# Patient Record
Sex: Female | Born: 1990 | Hispanic: Yes
Health system: Southern US, Community
[De-identification: ages and names within clinical notes are randomized; demographics above are authoritative.]

## PROBLEM LIST (undated history)

## (undated) ENCOUNTER — Inpatient Hospital Stay (HOSPITAL_COMMUNITY): Payer: Medicaid Other

## (undated) ENCOUNTER — Inpatient Hospital Stay (HOSPITAL_COMMUNITY): Payer: Self-pay

## (undated) ENCOUNTER — Emergency Department (HOSPITAL_COMMUNITY): Admission: EM | Payer: BC Managed Care – PPO

## (undated) DIAGNOSIS — Z789 Other specified health status: Secondary | ICD-10-CM

## (undated) DIAGNOSIS — O24419 Gestational diabetes mellitus in pregnancy, unspecified control: Secondary | ICD-10-CM

## (undated) DIAGNOSIS — E119 Type 2 diabetes mellitus without complications: Secondary | ICD-10-CM

## (undated) DIAGNOSIS — Z8759 Personal history of other complications of pregnancy, childbirth and the puerperium: Secondary | ICD-10-CM

## (undated) DIAGNOSIS — T7840XA Allergy, unspecified, initial encounter: Secondary | ICD-10-CM

## (undated) HISTORY — PX: INTRAUTERINE DEVICE (IUD) INSERTION: SHX5877

## (undated) HISTORY — DX: Allergy, unspecified, initial encounter: T78.40XA

## (undated) HISTORY — DX: Other specified health status: Z78.9

## (undated) HISTORY — PX: NO PAST SURGERIES: SHX2092

---
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
## 2016-02-10 ENCOUNTER — Ambulatory Visit (INDEPENDENT_AMBULATORY_CARE_PROVIDER_SITE_OTHER): Payer: Self-pay | Admitting: Urgent Care

## 2016-02-10 VITALS — BP 125/79 | HR 83 | Temp 98.0°F | Resp 18 | Ht 61.0 in | Wt 202.0 lb

## 2016-02-10 DIAGNOSIS — J029 Acute pharyngitis, unspecified: Secondary | ICD-10-CM

## 2016-02-10 DIAGNOSIS — R05 Cough: Secondary | ICD-10-CM

## 2016-02-10 DIAGNOSIS — R059 Cough, unspecified: Secondary | ICD-10-CM

## 2016-02-10 DIAGNOSIS — R519 Headache, unspecified: Secondary | ICD-10-CM

## 2016-02-10 DIAGNOSIS — R0989 Other specified symptoms and signs involving the circulatory and respiratory systems: Secondary | ICD-10-CM

## 2016-02-10 DIAGNOSIS — R52 Pain, unspecified: Secondary | ICD-10-CM

## 2016-02-10 DIAGNOSIS — R51 Headache: Secondary | ICD-10-CM

## 2016-02-10 MED ORDER — HYDROCODONE-HOMATROPINE 5-1.5 MG/5ML PO SYRP: 5.0000 mL | ORAL_SOLUTION | Freq: Every evening | ORAL | 0 refills | Status: DC | PRN

## 2016-02-10 MED ORDER — PSEUDOEPHEDRINE HCL ER 120 MG PO TB12: 120.0000 mg | ORAL_TABLET | Freq: Two times a day (BID) | ORAL | 3 refills | Status: DC

## 2016-02-10 MED ORDER — CETIRIZINE HCL 10 MG PO TABS: 10.0000 mg | ORAL_TABLET | Freq: Every day | ORAL | 11 refills | Status: DC

## 2016-02-10 MED ORDER — BENZONATATE 100 MG PO CAPS: 100.0000 mg | ORAL_CAPSULE | Freq: Three times a day (TID) | ORAL | 0 refills | Status: DC | PRN

## 2016-02-10 NOTE — Patient Instructions (Addendum)
Gripe en los adultos (Influenza, Adult) La gripe es una infeccin viral que afecta principalmente las vas respiratorias. Las vas respiratorias incluyen rganos que ayudan a Industrial/product designerrespirar, Toll Brotherscomo los pulmones, la nariz y Administratorla garganta. La gripe provoca muchos sntomas del resfro comn, as como fiebre alta y Tourist information centre managerdolor corporal. Se transmite fcilmente de persona a persona (es contagiosa). La mejor manera de prevenir la gripe es aplicndose la vacuna contra la gripe todos los aos. CAUSAS La gripe es causada por un virus. Se puede contagiar el virus de las siguientes [place] Plainsmaneras:  Al aspirar las gotitas que una persona infectada elimina al toser o Engineering geologistestornudar.  Al tocar algo que fue recientemente contaminado con el virus y Tenet Healthcareluego llevarse la mano a la boca, la nariz o los ojos. FACTORES DE RIESGO Los siguientes factores pueden hacer que usted sea propenso a Primary school teachercontraer la gripe:  No lavarse las manos frecuentemente con agua y Belarusjabn o un desinfectante de manos a base de alcohol.  Tener contacto cercano con [place] personas durante la temporada de resfro y gripe.  Tocarse la boca, los ojos o la nariz sin lavarse ni desinfectarse las manos primero.  No beber suficientes lquidos o no seguir una dieta saludable.  No dormir lo suficiente o no hacer suficiente actividad fsica.  Tener un alto grado de estrs.  No colocarse la vacuna anual contra la gripe. Puede correr un mayor riesgo de complicaciones de la gripe, como una infeccin pulmonar grave (neumona) si:  Tiene ms de 65aos.  Est embarazada.  Tiene un sistema que combate las defensas (sistema inmunitario) debilitado. Puede tener un sistema inmunitario debilitado si:  Tiene VIH o sida.  Est recibiendo quimioterapia.  Bots[place]asa medicamentos que reducen Coventry Health Carela actividad (suprimen) del sistema inmunitario.  Tiene una enfermedad a largo plazo (crnica), como cardiopata coronaria, enfermedad renal, diabetes o enfermedad pulmonar.  Tiene un trastorno  heptico.  Son obesas.  Tiene anemia. SNTOMAS Los sntomas de esta afeccin por lo general duran entre 4 y 2010das, y Conservator, museum/gallerypueden tener los siguientes sntomas:  Grant RutsFiebre.  Escalofros.  Dolor de Turkmenistancabeza, dolores en el cuerpo o dolores musculares.  Dolor de Advertising copywritergarganta.  Tos.  Secrecin o congestin nasal.  Molestias en el pecho y tos.  Prdida del apetito.  Debilidad o cansancio (fatiga).  Mareos.  Nuseas o vmitos. DIAGNSTICO Esta afeccin se puede diagnosticar en funcin de los antecedentes mdicos y un examen fsico. El mdico puede indicarle un cultivo farngeo o nasal para confirmar el diagnstico. TRATAMIENTO Si la gripe se detecta de manera temprana, puede recibir tratamiento con medicamentos antivirales que pueden reducir la duracin de la enfermedad y la gravedad de los sntomas. Este medicamento se puede administrar por va oral o por va intravenosa (IV), es decir, a travs de un tubo que se coloca en una vena. El objetivo del tratamiento es aliviar los sntomas cuidndose en su casa. Esto puede incluir usar medicamentos de venta libre, beber una gran cantidad de lquidos y agregar humedad al aire en su casa. En algunos casos, la gripe se cura sin tratamiento. Los casos graves o las complicaciones de gripe se pueden tratar en un hospital. INSTRUCCIONES PARA EL CUIDADO EN EL World Fuel Services CorporationHOGAR  Tome los medicamentos de venta libre y los recetados solamente como se lo haya indicado el mdico.  Use un humidificador de aire fro para agregar humedad al aire de su casa. Esto puede ayudarlo a Solicitorrespirar mejor.  Descanse todo lo que sea necesario.  Beba suficiente lquido para Photographermantener la orina clara o de color  amarillo plido.  Al toser o estornudar, cbrase la boca y la Mitchellnariz.  Lvese las manos con agua y jabn frecuentemente, en especial despus de toser o Engineering geologistestornudar. Use desinfectante para manos si no dispone de [place]agua y Bel[place]jabn.  Lanny HurstQudese en su casa y no concurra al Aleen Campitrabajo o a la  escuela, como se lo haya indicado el mdico. A menos que visite al American Expressmdico, evite salir de su casa hasta que no tenga fiebre durante 24horas sin el uso de medicamentos.  Concurra a todas las visitas de control como se lo haya indicado el mdico. Esto es importante. PREVENCIN  Aplicarse la vacuna anual contra la gripe es la mejor manera de evitar contagiarse la gripe. Puede aplicarse la vacuna contra la gripe a fines de verano, en otoo o en invierno. Pregntele al mdico cundo debe aplicarse la vacuna contra la gripe.  Lvese las manos o use un desinfectante de manos con frecuencia.  Evite el contacto con personas que estn enfermas durante la temporada de resfro y gripe.  Siga una dieta saludable, beba muchos lquidos, duerma lo suficiente y realice actividad fsica con regularidad. SOLICITE ATENCIN MDICA SI:  Presenta nuevos sntomas.  Tiene los siguientes sntomas:  Journalist, newspaperDolor en el pecho.  Diarrea.  Grant RutsFiebre.  La tos empeora.  Produce ms mucosidad.  Siente nuseas o vomita. SOLICITE ATENCIN MDICA DE INMEDIATO SI:  Le falta el aire o tiene dificultad para respirar.  La piel o las uas se tornan de un color Enochvilleazulado.  Presenta dolor intenso o rigidez de cuello.  Le duele la cabeza de forma repentina o tiene dolor en la cara o el odo.  No puede dejar de vomitar. Esta informacin no tiene Theme park managercomo fin reemplazar el consejo del mdico. Asegrese de hacerle al mdico cualquier pregunta que tenga. Document Released: 11/10/2004 Document Revised: 05/25/2015 Document Reviewed: 11/25/2014 Elsevier Interactive Patient Education  2017 [employer]vier Inc.     IF you received an x-ray today, you will receive an invoice from [facility]. Please contact [facility] at [phone] with questions or concerns regarding your invoice.   IF you received labwork today, you will receive an invoice from [facility]. Please contact [facility] at [phone] with questions or concerns  regarding your invoice.   Our billing staff will not be able to assist you with questions regarding bills from these companies.  You will be contacted with the lab results as soon as they are available. The fastest way to get your results is to activate your My Chart account. Instructions are located on the last page of this paperwork. If you have not heard from us regarding the results in 2 weeks, please contact this office.

## 2016-02-10 NOTE — Progress Notes (Signed)
    MRN: [id number] DOB: [birth year]  Subjective:   Kristy BuntingFatima Nguyen is a 25 y.o. female presenting for chief complaint of Fever (poss flu); Sore Throat; and Headache  Reports 5 day history of fever, headache, nausea with vomiting 2 days ago, nasal congestion, productive cough that initially was causing chest pain but is now improving, body aches. Has tried otc medications with minimal relief. Denies shob, wheezing, abdominal pain, rashes. Patient has not been resting, does not hydrate well. Smokes 1-2 cigarettes per day.   Kristy Nguyen is not currently taking any medications. Also is allergic to penicillins.  Kristy Nguyen  has a past medical history of Allergy. Also denies past surgical history.   Her family history includes Diabetes in her maternal grandmother.   Objective:   Vitals: BP 125/79 (BP Location: Right Arm, Patient Position: Sitting, Cuff Size: Large)   Pulse 83   Temp 98 F (36.7 C) (Oral)   Resp 18   Ht 5\' 1"  (1.549 m)   Wt 202 lb (91.6 kg)   SpO2 98%   BMI 38.17 kg/m   Physical Exam  Constitutional: She is oriented to person, place, and time. She appears well-developed and well-nourished.  HENT:  TM's intact bilaterally, no effusions or erythema. Nasal turbinates pink and moist, nasal passages minimally patent. No sinus tenderness. Tonsils enlarged but no exudates, erythema, mucous membranes moist, dentition in good repair.  Eyes: Right eye exhibits no discharge. Left eye exhibits no discharge.  Neck: Normal range of motion. Neck supple.  Cardiovascular: Normal rate, regular rhythm and intact distal pulses.  Exam reveals no gallop and no friction rub.   No murmur heard. Pulmonary/Chest: No respiratory distress. She has no wheezes. She has no rales.  Lymphadenopathy:    She has no cervical adenopathy.  Neurological: She is alert and oriented to person, place, and time.  Skin: Skin is warm and dry.   Assessment and Plan :   1. Body aches 2. Sore throat 3. Nonintractable  headache, unspecified chronicity pattern, unspecified headache type 4. Cough 5. Chest congestion - I suspect patient has influenza, will manage supportively for now. She refused testing including strep swab. I recommended she come back if she is not better by Saturday. She agreed to do strep swab and other work up at that point. Work note provided.  Wallis BambergMario Ariza Evans, PA-C [facility] [phone] 02/10/2016 12:25 PM

## 2016-02-15 NOTE — L&D Delivery Note (Signed)
Patient is 26 y.o. G2P0010 6060w2d admitted for IOL 2/2 oligohydraminos, hx of A2GDM  Delivery Note  Upon arrival patient was complete and pushing. She pushed with good maternal effort to deliver a viable infant in cephalic, LOA position. x1 nuchal cord, easily reduced.  Baby delivered with difficulty. Mother had tight pelvic outlet. Head delivered without difficulty but anterior shoulder did not.Shoulder dystocia was called and lasted 45sec. Manuvers used included McRoberts and Suprapubic pressure to deliver anterior shoulder. Baby did not have good tone at time of delivery and was nto breathing. Code APGAR called and cord clamped immediately and infant handed off to NICU team.    At 3:47 PM a viable female was delivered via  (Presentation: LOA).  APGAR: 6, 8; weight  3215g.  Placenta status: Intact.  Cord: 3V with the following complications: None.  Cord pH: 7.25  Anesthesia: Epidural Episiotomy:  None Lacerations: 1st degree Suture Repair: 3.0 monocryl Est. Blood Loss (mL):  100mL  Mom to postpartum.  Baby to Couplet care / Skin to Skin.  Vaginal canal and perineum was inspected and hemostatic. Pitocin was started and uterus massaged until bleeding slowed.  Fundus firm on exam with massage.  Counts of sharps, instruments, and lap pads were all correct.  Caryl AdaJazma Phelps, DO OB Fellow Faculty Practice, [facility] 09/15/2016, 4:13 PM

## 2016-03-26 ENCOUNTER — Inpatient Hospital Stay (HOSPITAL_COMMUNITY)
Admission: AD | Admit: 2016-03-26 | Discharge: 2016-03-26 | Disposition: A | Discharge status: Home / Self Care | Payer: Medicaid Other | Source: Ambulatory Visit | Attending: Obstetrics & Gynecology | Admitting: Obstetrics & Gynecology

## 2016-03-26 ENCOUNTER — Encounter (HOSPITAL_COMMUNITY): Payer: Self-pay | Admitting: *Deleted

## 2016-03-26 ENCOUNTER — Inpatient Hospital Stay (HOSPITAL_COMMUNITY): Payer: Medicaid Other

## 2016-03-26 DIAGNOSIS — O26851 Spotting complicating pregnancy, first trimester: Secondary | ICD-10-CM | POA: Diagnosis not present

## 2016-03-26 DIAGNOSIS — O99331 Smoking (tobacco) complicating pregnancy, first trimester: Secondary | ICD-10-CM | POA: Diagnosis not present

## 2016-03-26 DIAGNOSIS — R1030 Lower abdominal pain, unspecified: Secondary | ICD-10-CM | POA: Diagnosis not present

## 2016-03-26 DIAGNOSIS — Z3A12 12 weeks gestation of pregnancy: Secondary | ICD-10-CM | POA: Insufficient documentation

## 2016-03-26 DIAGNOSIS — O23591 Infection of other part of genital tract in pregnancy, first trimester: Secondary | ICD-10-CM | POA: Diagnosis not present

## 2016-03-26 DIAGNOSIS — Z3A10 10 weeks gestation of pregnancy: Secondary | ICD-10-CM

## 2016-03-26 DIAGNOSIS — O210 Mild hyperemesis gravidarum: Secondary | ICD-10-CM | POA: Diagnosis not present

## 2016-03-26 DIAGNOSIS — O26891 Other specified pregnancy related conditions, first trimester: Secondary | ICD-10-CM | POA: Insufficient documentation

## 2016-03-26 DIAGNOSIS — B9689 Other specified bacterial agents as the cause of diseases classified elsewhere: Secondary | ICD-10-CM

## 2016-03-26 DIAGNOSIS — F17200 Nicotine dependence, unspecified, uncomplicated: Secondary | ICD-10-CM | POA: Diagnosis not present

## 2016-03-26 DIAGNOSIS — R109 Unspecified abdominal pain: Secondary | ICD-10-CM | POA: Diagnosis present

## 2016-03-26 DIAGNOSIS — O209 Hemorrhage in early pregnancy, unspecified: Secondary | ICD-10-CM | POA: Diagnosis present

## 2016-03-26 DIAGNOSIS — N760 Acute vaginitis: Secondary | ICD-10-CM

## 2016-03-26 DIAGNOSIS — N939 Abnormal uterine and vaginal bleeding, unspecified: Secondary | ICD-10-CM

## 2016-03-26 LAB — CBC WITH DIFFERENTIAL/PLATELET
Basophils Absolute: 0 10*3/uL (ref 0.0–0.1)
Basophils Relative: 0 %
EOS PCT: 1 %
Eosinophils Absolute: 0.1 10*3/uL (ref 0.0–0.7)
HCT: 36.3 % (ref 36.0–46.0)
HEMOGLOBIN: 12.4 g/dL (ref 12.0–15.0)
LYMPHS ABS: 2 10*3/uL (ref 0.7–4.0)
LYMPHS PCT: 25 %
MCH: 29.5 pg (ref 26.0–34.0)
MCHC: 34.2 g/dL (ref 30.0–36.0)
MCV: 86.4 fL (ref 78.0–100.0)
Monocytes Absolute: 0.4 10*3/uL (ref 0.1–1.0)
Monocytes Relative: 5 %
Neutro Abs: 5.8 10*3/uL (ref 1.7–7.7)
Neutrophils Relative %: 69 %
PLATELETS: 300 10*3/uL (ref 150–400)
RBC: 4.2 MIL/uL (ref 3.87–5.11)
RDW: 12.5 % (ref 11.5–15.5)
WBC: 8.3 10*3/uL (ref 4.0–10.5)

## 2016-03-26 LAB — URINALYSIS, ROUTINE W REFLEX MICROSCOPIC
BILIRUBIN URINE: NEGATIVE
Bacteria, UA: NONE SEEN
GLUCOSE, UA: NEGATIVE mg/dL
Hgb urine dipstick: NEGATIVE
Ketones, ur: 20 mg/dL — AB
LEUKOCYTES UA: NEGATIVE
NITRITE: NEGATIVE
PH: 6 (ref 5.0–8.0)
Protein, ur: 30 mg/dL — AB
Specific Gravity, Urine: 1.019 (ref 1.005–1.030)
WBC UA: NONE SEEN WBC/hpf (ref 0–5)

## 2016-03-26 LAB — WET PREP, GENITAL
SPERM: NONE SEEN
TRICH WET PREP: NONE SEEN
WBC WET PREP: NONE SEEN
Yeast Wet Prep HPF POC: NONE SEEN

## 2016-03-26 LAB — HCG, QUANTITATIVE, PREGNANCY: hCG, Beta Chain, Quant, S: 66864 m[IU]/mL — ABNORMAL HIGH (ref <5)

## 2016-03-26 LAB — ABO/RH: ABO/RH(D): O POS

## 2016-03-26 LAB — POCT PREGNANCY, URINE: Preg Test, Ur: POSITIVE — AB

## 2016-03-26 MED ORDER — METOCLOPRAMIDE HCL 5 MG PO TABS: 5.0000 mg | ORAL_TABLET | Freq: Four times a day (QID) | ORAL | 1 refills | Status: DC

## 2016-03-26 MED ORDER — METRONIDAZOLE 500 MG PO TABS: 500.0000 mg | ORAL_TABLET | Freq: Three times a day (TID) | ORAL | 0 refills | Status: AC

## 2016-03-26 NOTE — MAU Note (Signed)
Pt presents to MAU with complaints of lower abdominal cramping for a month, with vaginal spotting on and off. Pt states she was told that she was ten weeks pregnant at a clinic last week by blood work. Pt also complains of nausea

## 2016-03-26 NOTE — Discharge Instructions (Signed)
Vaginosis bacteriana (Bacterial Vaginosis) La vaginosis bacteriana es una infeccin de la vagina. Se produce cuando crece una cantidad excesiva de grmenes normales (bacterias sanas) en la vagina. Esta infeccin aumenta el riesgo de contraer otras infecciones de transmisin sexual. El tratamiento de esta infeccin puede ayudar a reducir el riesgo de otras infecciones, como:  Clamidia.  Gonorrea.  VIH.  Herpes. CUIDADOS EN EL HOGAR  Tome los medicamentos tal como se lo indic su mdico.  Finalice la prescripcin completa, aunque comience a sentirse mejor.  Comunique a sus compaeros sexuales que sufre una infeccin. Deben consultar a su mdico para iniciar un tratamiento.  Durante el tratamiento: ? Evite mantener relaciones sexuales o use preservativos de la forma correcta. ? No se haga duchas vaginales. ? No consuma alcohol a menos que el mdico lo autorice. ? No amamante a menos que el mdico la autorice.  SOLICITE AYUDA SI:  No mejora luego de 3 das de tratamiento.  Observa una secrecin (prdida) de color gris ms abundante que proviene de la vagina.  Siente ms dolor que antes.  Tiene fiebre.  ASEGRESE DE QUE:  Comprende estas instrucciones.  Controlar su afeccin.  Recibir ayuda de inmediato si no mejora o si empeora.  Esta informacin no tiene como fin reemplazar el consejo del mdico. Asegrese de hacerle al mdico cualquier pregunta que tenga. Document Released: 04/29/2008 Document Revised: 05/25/2015 Document Reviewed: 09/12/2012 Elsevier Interactive Patient Education  2017 Elsevier Inc.  

## 2016-03-26 NOTE — MAU Provider Note (Signed)
History   Patient Kristy Nguyen is a 26 year old G2P0010 at 10 weeks by uncertain LMP. She presents with spotting today and on Thursday, as well as abdominal pain that has been going on for a month. She has felt nauseated for the past month and has been unable to keep food down.   CSN: [id number]  Arrival date and time: 03/26/16 1055   None     Chief Complaint  Patient presents with  . Vaginal Bleeding  . Morning Sickness   Abdominal Pain  This is a new problem. The current episode started 1 to 4 weeks ago. The onset quality is gradual. The problem occurs 2 to 4 times per day. The problem has been unchanged. The pain is located in the suprapubic region. The pain is at a severity of 6/10. The quality of the pain is cramping. The abdominal pain does not radiate. Pertinent negatives include no anorexia, arthralgias, belching, constipation, diarrhea, dysuria, fever, flatus, frequency, headaches, hematochezia, hematuria, melena, myalgias, nausea, vomiting or weight loss. Nothing aggravates the pain. The pain is relieved by nothing. She has tried nothing for the symptoms.  Vaginal Bleeding  The patient's primary symptoms include vaginal bleeding. The patient's pertinent negatives include no genital itching, genital lesions, genital odor, genital rash, missed menses, pelvic pain or vaginal discharge. This is a new problem. The current episode started in the past 7 days. Episode frequency: she had some drops of blood on her underwear on Thursday and some drops of blood this morning. Last intercourse was a week ago.  The patient is experiencing no pain. Associated symptoms include abdominal pain. Pertinent negatives include no anorexia, constipation, diarrhea, dysuria, fever, frequency, headaches, hematuria, nausea or vomiting.    OB History    Gravida Para Term Preterm AB Living   2       1     SAB TAB Ectopic Multiple Live Births   1              Past Medical History:  Diagnosis Date  .  Allergy     History reviewed. No pertinent surgical history.  Family History  Problem Relation Age of Onset  . Diabetes Maternal Grandmother     Social History  Substance Use Topics  . Smoking status: Current Some Day Smoker  . Smokeless tobacco: Former Neurosurgeon  . Alcohol use No    Allergies:  Allergies  Allergen Reactions  . Penicillins Rash and Other (See Comments)    Has patient had a PCN reaction causing immediate rash, facial/tongue/throat swelling, SOB or lightheadedness with hypotension: No Has patient had a PCN reaction causing severe rash involving mucus membranes or skin necrosis: No Has patient had a PCN reaction that required hospitalization No Has patient had a PCN reaction occurring within the last 10 years: Yes If all of the above answers are "NO", then may proceed with Cephalosporin use.    Prescriptions Prior to Admission  Medication Sig Dispense Refill Last Dose  . Prenatal Vit-Fe Fumarate-FA (PRENATAL MULTIVITAMIN) TABS tablet Take 1 tablet by mouth daily.   Past Week at Unknown time    Review of Systems  Constitutional: Negative for fever and weight loss.  Eyes: Negative.   Respiratory: Negative.   Cardiovascular: Negative.   Gastrointestinal: Positive for abdominal pain. Negative for anorexia, constipation, diarrhea, flatus, hematochezia, melena, nausea and vomiting.  Endocrine: Negative.   Genitourinary: Positive for vaginal bleeding. Negative for dysuria, frequency, hematuria, missed menses, pelvic pain and vaginal discharge.  Musculoskeletal: Negative  for arthralgias and myalgias.  Allergic/Immunologic: Negative.   Neurological: Negative for headaches.   Physical Exam   There were no vitals taken for this visit.  Physical Exam  Constitutional: She is oriented to person, place, and time. She appears well-developed.  HENT:  Head: Normocephalic.  Eyes: Pupils are equal, round, and reactive to light.  Neck: Normal range of motion.  Respiratory:  Effort normal. No respiratory distress. She has no wheezes. She has no rales. She exhibits no tenderness.  GI: Soft. She exhibits no distension and no mass. There is no tenderness. There is no rebound and no guarding.  Genitourinary:  Genitourinary Comments: NEFG; no lesions on internal vaginal walls. Cervix is pink with no lesions; no CMT. No discharge in the vagina. No vaginal bleeding on exam. No suprapubic tenderness on palpation.   Musculoskeletal: Normal range of motion.  Neurological: She is alert and oriented to person, place, and time.  Skin: Skin is warm and dry.    MAU Course  Procedures  MDM -UA : cloudy, mild dehydration. Protein present and most likely due to vaginal spotting this morning.  -US for ectopic rule out: US shows SIUP at 12 weeks and 4 days; no signs of subchorionic hemorrhage.  -CBC, ABO , beta. Beta is 66,000,  Rh+.  -wet prep: clue cells present -GC CT pending Patient has not felt nauseated since she has been in MAU.  Assessment and Plan   1. Lower abdominal pain   2. Vaginal bleeding   3. Bacterial vaginosis    2. Patient to be discharged home with RX for Reglan and Flagyl and instructions on importance of high protein,  small frequent meals and separating water intake and meals. Gave instructions to take Flagyl with food and Reglan to minimize vomiting.  3. I will send a note to the clinic to call this patient to begin prenatal care.   Charlesetta GaribaldiKathryn Lorraine Nneka Blanda CNM 03/26/2016, 11:42 AM

## 2016-03-28 LAB — GC/CHLAMYDIA PROBE AMP (~~LOC~~) NOT AT ARMC
Chlamydia: NEGATIVE
Neisseria Gonorrhea: NEGATIVE

## 2016-04-05 ENCOUNTER — Encounter: Payer: Self-pay | Admitting: Student

## 2016-04-05 ENCOUNTER — Ambulatory Visit (INDEPENDENT_AMBULATORY_CARE_PROVIDER_SITE_OTHER): Payer: Self-pay | Admitting: Clinical

## 2016-04-05 ENCOUNTER — Ambulatory Visit (INDEPENDENT_AMBULATORY_CARE_PROVIDER_SITE_OTHER): Payer: Medicaid Other | Admitting: Student

## 2016-04-05 VITALS — BP 112/69 | HR 91 | Wt 196.9 lb

## 2016-04-05 DIAGNOSIS — Z3492 Encounter for supervision of normal pregnancy, unspecified, second trimester: Secondary | ICD-10-CM

## 2016-04-05 DIAGNOSIS — O0990 Supervision of high risk pregnancy, unspecified, unspecified trimester: Secondary | ICD-10-CM | POA: Insufficient documentation

## 2016-04-05 DIAGNOSIS — F4323 Adjustment disorder with mixed anxiety and depressed mood: Secondary | ICD-10-CM

## 2016-04-05 DIAGNOSIS — Z23 Encounter for immunization: Secondary | ICD-10-CM | POA: Diagnosis not present

## 2016-04-05 HISTORY — DX: Supervision of high risk pregnancy, unspecified, unspecified trimester: O09.90

## 2016-04-05 LAB — POCT URINALYSIS DIP (DEVICE)
BILIRUBIN URINE: NEGATIVE
Glucose, UA: NEGATIVE mg/dL
KETONES UR: NEGATIVE mg/dL
LEUKOCYTES UA: NEGATIVE
NITRITE: POSITIVE — AB
PH: 6 (ref 5.0–8.0)
Protein, ur: NEGATIVE mg/dL
SPECIFIC GRAVITY, URINE: 1.02 (ref 1.005–1.030)
Urobilinogen, UA: 1 mg/dL (ref 0.0–1.0)

## 2016-04-05 NOTE — BH Specialist Note (Signed)
Session Start time: 4:00  End Time: 4:20 Total Time:  20 minutes Type of Service: Behavioral Health - Individual/Family Interpreter: Yes.     Interpreter Name & Language: Spanish # [facility]  SUBJECTIVE: Kristy Nguyen is a 26 y.o. female  Pt. was referred by Luna KitchensKathryn Kooistra, CNM for:  anxiety and depression. Pt. reports the following symptoms/concerns: Pt states that she primarily feels tired, with a lack of appetite, attributing this to feeling sick during early pregnancy; coped by being active prior to pregnancy. Duration of problem:  Current pregnancy Severity: moderate Previous treatment: none  OBJECTIVE: Mood: Appropriate & Affect: Appropriate Risk of harm to self or others: No known risk of harm to self or others Assessments administered: PHQ9: 14/ GAD7: 9  LIFE CONTEXT:  Family & Social: Lives with FOB School/ Work: Undetermined  Self-Care: "Staying active" prior to pregnancy, and sleeps well.   Life changes: Current pregnancy What is important to pt/family (values): Healthy pregnancy  GOALS ADDRESSED:  -Reduce symptoms of anxiety and depression  INTERVENTIONS: Motivational Interviewing   ASSESSMENT:  Pt currently experiencing Adjustment disorder with mixed anxious and depressed mood.  Pt may benefit from psychoeducation and brief therapeutic intervention regarding coping with symptoms of anxiety and depression.   PLAN: 1. F/U with behavioral health clinician: One month 2. Behavioral Health meds: none 3. Behavioral recommendations:  -Consider taking daily walks to lift mood -Consider reading educational material regarding coping with symptoms of anxiety and depression 4. Referral: Brief Counseling/Psychotherapy and Psychoeducation 5. From scale of 1-10, how likely are you to follow plan: 8  Woc-Behavioral Health Clinician  Behavioral Health Clinician  Marlon PelWarmhandoff:   Warm Hand Off Completed.        Depression screen [facility] 2/9  04/05/2016 02/10/2016  Decreased Interest 3 0  Down, Depressed, Hopeless 3 0  PHQ - 2 Score 6 0  Altered sleeping 2 -  Tired, decreased energy 3 -  Change in appetite 3 -  Feeling bad or failure about yourself  0 -  Trouble concentrating 0 -  Moving slowly or fidgety/restless 0 -  Suicidal thoughts 0 -  PHQ-9 Score 14 -   GAD 7 : Generalized Anxiety Score 04/05/2016  Nervous, Anxious, on Edge 3  Control/stop worrying 1  Worry too much - different things 0  Trouble relaxing 2  Restless 0  Easily annoyed or irritable 3  Afraid - awful might happen 0  Total GAD 7 Score 9

## 2016-04-05 NOTE — Progress Notes (Signed)
  Subjective:    Kristy Nguyen is being seen today for her first obstetrical visit.  This is a planned pregnancy. She is at 4822w0d gestation. Her obstetrical history is significant for nothing. Relationship with FOB: spouse, living together. Patient does intend to breast feed. Pregnancy history fully reviewed.  Patient reports still feeling some nausea. Has been eating soup and that seems to help. .  Review of Systems:   Review of Systems  Constitutional: Negative.   HENT: Negative.   Eyes: Negative.   Respiratory: Negative.   Cardiovascular: Negative.   Gastrointestinal: Negative.   Endocrine: Negative.   Genitourinary: Negative.   Musculoskeletal: Negative.   Neurological: Negative.   Hematological: Negative.   Psychiatric/Behavioral: Negative.     Objective:     BP 112/69   Pulse 91   Wt 196 lb 14.4 oz (89.3 kg)   LMP 12/29/2015   BMI 37.20 kg/m  Physical Exam  Constitutional: She is oriented to person, place, and time. She appears well-developed and well-nourished.  HENT:  Head: Normocephalic.  Neck: Normal range of motion.  Cardiovascular: Normal rate and regular rhythm.   Respiratory: Effort normal and breath sounds normal.  GI: Soft. Bowel sounds are normal.  Genitourinary:  Genitourinary Comments: NEFG, vaginal walls pink with no lesions. Scant mucousy dischrge in the vagina. No CMT, no adnexal tenderness no suprapubic tenderness.   Musculoskeletal: Normal range of motion.  Neurological: She is alert and oriented to person, place, and time.  Skin: Skin is warm and dry.  Psychiatric: She has a normal mood and affect.    Exam    Assessment:    Pregnancy: G2P0010 Patient Active Problem List   Diagnosis Date Noted  . Supervision of low-risk pregnancy 04/05/2016       Plan:     Initial labs drawn. Pap today.  Prenatal vitamins. Problem list reviewed and updated. AFP3 discussed: undecided. Role of ultrasound in pregnancy discussed; fetal survey:  requested. Amniocentesis discussed: not indicated. Follow up in 4 weeks. 50% of 45 min visit spent on counseling and coordination of care.   Encouraged patient to  Continue to eat small meals, drink low-sugar juice when she can't keep water down and exercise every day for 30 minutes.  MFM Complete ordered  Charlesetta GaribaldiKathryn Lorraine [facility] CNM 04/05/2016

## 2016-04-06 LAB — PRENATAL PROFILE I(LABCORP)
Antibody Screen: NEGATIVE
BASOS ABS: 0 10*3/uL (ref 0.0–0.2)
BASOS: 0 %
EOS (ABSOLUTE): 0.2 10*3/uL (ref 0.0–0.4)
EOS: 2 %
HEMATOCRIT: 34.7 % (ref 34.0–46.6)
Hemoglobin: 11.3 g/dL (ref 11.1–15.9)
Hepatitis B Surface Ag: NEGATIVE
IMMATURE GRANS (ABS): 0 10*3/uL (ref 0.0–0.1)
Immature Granulocytes: 0 %
LYMPHS: 24 %
Lymphocytes Absolute: 2.5 10*3/uL (ref 0.7–3.1)
MCH: 28.8 pg (ref 26.6–33.0)
MCHC: 32.6 g/dL (ref 31.5–35.7)
MCV: 88 fL (ref 79–97)
MONOCYTES: 9 %
Monocytes Absolute: 0.9 10*3/uL (ref 0.1–0.9)
Neutrophils Absolute: 6.6 10*3/uL (ref 1.4–7.0)
Neutrophils: 65 %
PLATELETS: 308 10*3/uL (ref 150–379)
RBC: 3.93 x10E6/uL (ref 3.77–5.28)
RDW: 12.9 % (ref 12.3–15.4)
RPR Ser Ql: NONREACTIVE
RUBELLA: 24.6 {index} (ref >0.99)
Rh Factor: POSITIVE
WBC: 10.2 10*3/uL (ref 3.4–10.8)

## 2016-04-06 LAB — CYTOLOGY - PAP
ADEQUACY: ABSENT
Chlamydia: NEGATIVE
DIAGNOSIS: NEGATIVE
Neisseria Gonorrhea: NEGATIVE

## 2016-04-11 LAB — HEMOGLOBIN A1C
ESTIMATED AVERAGE GLUCOSE: 100 mg/dL
Hgb A1c MFr Bld: 5.1 % (ref 4.8–5.6)

## 2016-04-11 LAB — HEMOGLOBINOPATHY EVALUATION
Ferritin: 163 ng/mL — ABNORMAL HIGH (ref 15–150)
HEMATOCRIT: 35.4 % (ref 34.0–46.6)
HEMOGLOBIN: 11.6 g/dL (ref 11.1–15.9)
HGB A2 QUANT: 2.4 % (ref 1.8–3.2)
HGB A: 97.6 % (ref 96.4–98.8)
HGB C: 0 %
HGB S: 0 %
HGB VARIANT: 0 %
Hgb F Quant: 0 % (ref 0.0–2.0)
Hgb Solubility: NEGATIVE
MCH: 29.3 pg (ref 26.6–33.0)
MCHC: 32.8 g/dL (ref 31.5–35.7)
MCV: 89 fL (ref 79–97)
Platelets: 317 10*3/uL (ref 150–379)
RBC: 3.96 x10E6/uL (ref 3.77–5.28)
RDW: 12.9 % (ref 12.3–15.4)
WBC: 10.4 10*3/uL (ref 3.4–10.8)

## 2016-04-11 LAB — CYSTIC FIBROSIS GENE TEST

## 2016-04-11 LAB — URINE CULTURE, OB REFLEX

## 2016-04-11 LAB — CULTURE, OB URINE

## 2016-04-13 ENCOUNTER — Other Ambulatory Visit: Payer: Self-pay | Admitting: Student

## 2016-04-13 ENCOUNTER — Telehealth: Payer: Self-pay | Admitting: General Practice

## 2016-04-13 DIAGNOSIS — R8271 Bacteriuria: Secondary | ICD-10-CM

## 2016-04-13 DIAGNOSIS — O99891 Other specified diseases and conditions complicating pregnancy: Secondary | ICD-10-CM | POA: Insufficient documentation

## 2016-04-13 DIAGNOSIS — O9989 Other specified diseases and conditions complicating pregnancy, childbirth and the puerperium: Principal | ICD-10-CM

## 2016-04-13 MED ORDER — NITROFURANTOIN MONOHYD MACRO 100 MG PO CAPS: 100.0000 mg | ORAL_CAPSULE | Freq: Two times a day (BID) | ORAL | 0 refills | Status: AC

## 2016-04-13 NOTE — Telephone Encounter (Signed)
Per Samara DeistKathryn, patient has UTI & antibiotic has been sent to pharmacy. Called patient with pac[employer] interpreter [phone], no answer & unable to leave voicemail due to no VM set up.

## 2016-04-25 NOTE — Telephone Encounter (Signed)
I called Chrystian with Interpreter Elna BreslowCarol Hernandez and left a message we are calling with some information. Please call our office.

## 2016-04-28 NOTE — Telephone Encounter (Signed)
Called pt w/[employer] interpreter # [phone] and there was no answer and no voice mail option. I called pt's pharmacy and was informed that pt has not obtained Macrobid prescribed on 2/28.  Since we have been trying to reach pt for over 2 weeks, I then called pt's alternate contact Kristy Nguyen - mother. I informed her that we have not been able to reach [facility] for 2 weeks. She confirmed that we have the correct telephone number. I asked Kristy Nguyen to tell Kristy Nguyen that we have prescribed medication and she needs to obtain it from the pharmacy. She can call us if she has questions. Kristy Nguyen agreed and stated that we can call her anytime and she will give a message to [place] AllisFatima.

## 2016-05-03 ENCOUNTER — Encounter (HOSPITAL_COMMUNITY): Payer: Self-pay | Admitting: Student

## 2016-05-10 ENCOUNTER — Other Ambulatory Visit: Payer: Self-pay | Admitting: Student

## 2016-05-10 ENCOUNTER — Ambulatory Visit (HOSPITAL_COMMUNITY)
Admission: RE | Admit: 2016-05-10 | Discharge: 2016-05-10 | Disposition: A | Discharge status: Home / Self Care | Payer: Medicaid Other | Source: Ambulatory Visit | Attending: Student | Admitting: Student

## 2016-05-10 DIAGNOSIS — Z363 Encounter for antenatal screening for malformations: Secondary | ICD-10-CM | POA: Insufficient documentation

## 2016-05-10 DIAGNOSIS — Z3492 Encounter for supervision of normal pregnancy, unspecified, second trimester: Secondary | ICD-10-CM

## 2016-05-10 DIAGNOSIS — Z3A19 19 weeks gestation of pregnancy: Secondary | ICD-10-CM

## 2016-05-10 DIAGNOSIS — O99212 Obesity complicating pregnancy, second trimester: Secondary | ICD-10-CM | POA: Diagnosis present

## 2016-05-17 ENCOUNTER — Ambulatory Visit (INDEPENDENT_AMBULATORY_CARE_PROVIDER_SITE_OTHER): Payer: Medicaid Other | Admitting: Medical

## 2016-05-17 VITALS — BP 106/58 | HR 78 | Wt 199.0 lb

## 2016-05-17 DIAGNOSIS — Z3482 Encounter for supervision of other normal pregnancy, second trimester: Secondary | ICD-10-CM

## 2016-05-17 DIAGNOSIS — Z3492 Encounter for supervision of normal pregnancy, unspecified, second trimester: Secondary | ICD-10-CM

## 2016-05-17 LAB — POCT URINALYSIS DIP (DEVICE)
BILIRUBIN URINE: NEGATIVE
Glucose, UA: NEGATIVE mg/dL
Ketones, ur: NEGATIVE mg/dL
Leukocytes, UA: NEGATIVE
Nitrite: NEGATIVE
PROTEIN: 30 mg/dL — AB
Specific Gravity, Urine: >=1.03 (ref 1.005–1.030)
UROBILINOGEN UA: 0.2 mg/dL (ref 0.0–1.0)
pH: 5.5 (ref 5.0–8.0)

## 2016-05-17 MED ORDER — PRENATAL VITAMINS 0.8 MG PO TABS: 1.0000 | ORAL_TABLET | Freq: Every day | ORAL | 12 refills | Status: DC

## 2016-05-17 NOTE — Patient Instructions (Signed)
Segundo trimestre de embarazo (Second Trimester of Pregnancy) El segundo trimestre va desde la semana13 hasta la 28, desde el cuarto hasta el sexto mes, y suele ser el momento en el que mejor se siente. En general, las nuseas matutinas han disminuido o han desaparecido completamente. Tendr ms energa y podr aumentarle el apetito. El beb por nacer (feto) se desarrolla rpidamente. Hacia el final del sexto mes, el beb mide aproximadamente 9 pulgadas (23 cm) y pesa alrededor de 1 libras (700 g). Es probable que sienta al beb moverse (dar pataditas) entre las 18 y 20 semanas del embarazo. CUIDADOS EN EL HOGAR  No fume, no consuma hierbas ni beba alcohol. No tome frmacos que el mdico no haya autorizado.  No consuma ningn producto que contenga tabaco, lo que incluye cigarrillos, tabaco de mascar o cigarrillos electrnicos. Si necesita ayuda para dejar de fumar, consulte al mdico. Puede recibir asesoramiento u otro tipo de apoyo para dejar de fumar.  Tome los medicamentos solamente como se lo haya indicado el mdico. Algunos medicamentos son seguros para tomar durante el embarazo y otros no lo son.  Haga ejercicios solamente como se lo haya indicado el mdico. Interrumpa la actividad fsica si comienza a tener calambres.  Ingiera alimentos saludables de manera regular.  Use un sostn que le brinde buen soporte si sus mamas estn sensibles.  No se d baos de inmersin en agua caliente, baos turcos ni saunas.  Colquese el cinturn de seguridad cuando conduzca.  No coma carne cruda ni queso sin cocinar; evite el contacto con las bandejas sanitarias de los gatos y la tierra que estos animales usan.  Tome las vitaminas prenatales.  Tome entre 1500 y 2000mg de calcio diariamente comenzando en la semana20 del embarazo hasta el parto.  Pruebe tomar un medicamento que la ayude a defecar (un laxante suave) si el mdico lo autoriza. Consuma ms fibra, que se encuentra en las frutas y  verduras frescas y los cereales integrales. Beba suficiente lquido para mantener el pis (orina) claro o de color amarillo plido.  Dese baos de asiento con agua tibia para aliviar el dolor o las molestias causadas por las hemorroides. Use una crema para las hemorroides si el mdico la autoriza.  Si se le hinchan las venas (venas varicosas), use medias de descanso. Levante (eleve) los pies durante 15minutos, 3 o 4veces por da. Limite el consumo de sal en su dieta.  No levante objetos pesados, use zapatos de tacones bajos y sintese derecha.  Descanse con las piernas elevadas si tiene calambres o dolor de cintura.  Visite a su dentista si no lo ha hecho durante el embarazo. Use un cepillo de cerdas suaves para cepillarse los dientes. Psese el hilo dental con suavidad.  Puede seguir manteniendo relaciones sexuales, a menos que el mdico le indique lo contrario.  Concurra a los controles mdicos.  SOLICITE AYUDA SI:  Siente mareos.  Sufre calambres o presin leves en la parte baja del vientre (abdomen).  Sufre un dolor persistente en el abdomen.  Tiene malestar estomacal (nuseas), vmitos, o tiene deposiciones acuosas (diarrea).  Advierte un olor ftido que proviene de la vagina.  Siente dolor al orinar.  SOLICITE AYUDA DE INMEDIATO SI:  Tiene fiebre.  Tiene una prdida de lquido por la vagina.  Tiene sangrado o pequeas prdidas vaginales.  Siente dolor intenso o clicos en el abdomen.  Sube o baja de peso rpidamente.  Tiene dificultades para recuperar el aliento y siente dolor en el pecho.  Sbitamente se   le hinchan mucho el rostro, las manos, los tobillos, los pies o las piernas.  No ha sentido los movimientos del beb durante una hora.  Siente un dolor de cabeza intenso que no se alivia con medicamentos.  Su visin se modifica.  Esta informacin no tiene como fin reemplazar el consejo del mdico. Asegrese de hacerle al mdico cualquier pregunta que  tenga. Document Released: 10/03/2012 Document Revised: 02/21/2014 Document Reviewed: 04/03/2012 Elsevier Interactive Patient Education  2017 Elsevier Inc.  

## 2016-05-17 NOTE — Progress Notes (Signed)
[employer] interpreter Vernona Rieger [phone]

## 2016-05-17 NOTE — Progress Notes (Signed)
   PRENATAL VISIT NOTE  Subjective:  Kristy Nguyen is a 26 y.o. G2P0010 at [redacted]w[redacted]d being seen today for ongoing prenatal care.  She is currently monitored for the following issues for this low-risk pregnancy and has Supervision of low-risk pregnancy and Asymptomatic bacteriuria during pregnancy on her problem list.  Patient reports no complaints.  Contractions: Not present. Vag. Bleeding: None.  Movement: Absent. Denies leaking of fluid.   The following portions of the patient's history were reviewed and updated as appropriate: allergies, current medications, past family history, past medical history, past social history, past surgical history and problem list. Problem list updated.  Objective:   Vitals:   05/17/16 0851  BP: (!) 106/58  Pulse: 78  Weight: 199 lb (90.3 kg)    Fetal Status: Fetal Heart Rate (bpm): 143 Fundal Height: 21 cm Movement: Absent     General:  Alert, oriented and cooperative. Patient is in no acute distress.  Skin: Skin is warm and dry. No rash noted.   Cardiovascular: Normal heart rate noted  Respiratory: Normal respiratory effort, no problems with respiration noted  Abdomen: Soft, gravid, appropriate for gestational age. Pain/Pressure: Present     Pelvic:  Cervical exam deferred        Extremities: Normal range of motion.  Edema: None  Mental Status: Normal mood and affect. Normal behavior. Normal judgment and thought content.   Assessment and Plan:  Pregnancy: G2P0010 at [redacted]w[redacted]d  1. Encounter for supervision of low-risk pregnancy in second trimester - HIV antibody - Prenatal Multivit-Min-Fe-FA (PRENATAL VITAMINS) 0.8 MG tablet; Take 1 tablet by mouth daily.  Dispense: 30 tablet; Refill: 12 - UA today   Preterm labor symptoms and general obstetric precautions including but not limited to vaginal bleeding, contractions, leaking of fluid and fetal movement were reviewed in detail with the patient. Please refer to After Visit Summary for other counseling  recommendations.  Return in about 4 weeks (around 06/14/2016) for LOB.   Marny Lowenstein, PA-C

## 2016-05-18 LAB — HIV ANTIBODY (ROUTINE TESTING W REFLEX): HIV Screen 4th Generation wRfx: NONREACTIVE

## 2016-06-22 ENCOUNTER — Encounter: Payer: Self-pay | Admitting: *Deleted

## 2016-06-22 ENCOUNTER — Ambulatory Visit (INDEPENDENT_AMBULATORY_CARE_PROVIDER_SITE_OTHER): Payer: Medicaid Other | Admitting: Medical

## 2016-06-22 VITALS — BP 102/70 | HR 82 | Wt 202.3 lb

## 2016-06-22 DIAGNOSIS — Z3482 Encounter for supervision of other normal pregnancy, second trimester: Secondary | ICD-10-CM | POA: Diagnosis not present

## 2016-06-22 DIAGNOSIS — Z3492 Encounter for supervision of normal pregnancy, unspecified, second trimester: Secondary | ICD-10-CM

## 2016-06-22 NOTE — Progress Notes (Signed)
   PRENATAL VISIT NOTE  Subjective:  Kristy Nguyen is a 26 y.o. G2P0010 at 6664w1d being seen today for ongoing prenatal care.  She is currently monitored for the following issues for this low-risk pregnancy and has Supervision of low-risk pregnancy and Asymptomatic bacteriuria during pregnancy on her problem list.  Patient reports possible LOF 2 days ago and bilateral foot pain at night..  Contractions: Not present. Vag. Bleeding: None.  Movement: Present. Denies leaking of fluid.   The following portions of the patient's history were reviewed and updated as appropriate: allergies, current medications, past family history, past medical history, past social history, past surgical history and problem list. Problem list updated.  Objective:   Vitals:   06/22/16 1024  BP: 102/70  Pulse: 82  Weight: 202 lb 4.8 oz (91.8 kg)    Fetal Status: Fetal Heart Rate (bpm): 144   Movement: Present     General:  Alert, oriented and cooperative. Patient is in no acute distress.  Skin: Skin is warm and dry. No rash noted.   Cardiovascular: Normal heart rate noted  Respiratory: Normal respiratory effort, no problems with respiration noted  Abdomen: Soft, gravid, appropriate for gestational age. Pain/Pressure: Present     Pelvic:  Cervical exam deferred       Normal vaginal tissue and discharge. No pooling. Fern - negative. Small area concerning for condylomatous tissue near the rectum.   Extremities: Normal range of motion.  Edema: None  Mental Status: Normal mood and affect. Normal behavior. Normal judgment and thought content.   Assessment and Plan:  Pregnancy: G2P0010 at 464w1d  1. Encounter for supervision of low-risk pregnancy in second trimester - Patient advised to try Tylenol, foot soaks, wear supportive footwear and take breaks with elevation of the lower extremities often for foot pain - Advised that if these interventions do not improve her foot pain, she may be referred to PT at next  visit - Discussed need for 2 hour GTT at next visit. Patient voiced understanding  2. 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 Patient states this has been present since onset of pregnancy. Will continue to monitor at this time.   Preterm labor symptoms and general obstetric precautions including but not limited to vaginal bleeding, contractions, leaking of fluid and fetal movement were reviewed in detail with the patient. Please refer to After Visit Summary for other counseling recommendations.  Return in about 4 weeks (around 07/20/2016) for LOB and fasting 2 hour GTT/labs.   Marny LowensteinWenzel, Burnell Matlin N, PA-C

## 2016-06-22 NOTE — Patient Instructions (Signed)
Second Trimester of Pregnancy The second trimester is from week 13 through week 28, month 4 through 6. This is often the time in pregnancy that you feel your best. Often times, morning sickness has lessened or quit. You may have more energy, and you may get hungry more often. Your unborn baby (fetus) is growing rapidly. At the end of the sixth month, he or she is about 9 inches long and weighs about 1 pounds. You will likely feel the baby move (quickening) between 18 and 20 weeks of pregnancy. Follow these instructions at home:  Avoid all smoking, herbs, and alcohol. Avoid drugs not approved by your doctor.  Do not use any tobacco products, including cigarettes, chewing tobacco, and electronic cigarettes. If you need help quitting, ask your doctor. You may get counseling or other support to help you quit.  Only take medicine as told by your doctor. Some medicines are safe and some are not during pregnancy.  Exercise only as told by your doctor. Stop exercising if you start having cramps.  Eat regular, healthy meals.  Wear a good support bra if your breasts are tender.  Do not use hot tubs, steam rooms, or saunas.  Wear your seat belt when driving.  Avoid raw meat, uncooked cheese, and liter boxes and soil used by cats.  Take your prenatal vitamins.  Take 1500-2000 milligrams of calcium daily starting at the 20th week of pregnancy until you deliver your baby.  Try taking medicine that helps you poop (stool softener) as needed, and if your doctor approves. Eat more fiber by eating fresh fruit, vegetables, and whole grains. Drink enough fluids to keep your pee (urine) clear or pale yellow.  Take warm water baths (sitz baths) to soothe pain or discomfort caused by hemorrhoids. Use hemorrhoid cream if your doctor approves.  If you have puffy, bulging veins (varicose veins), wear support hose. Raise (elevate) your feet for 15 minutes, 3-4 times a day. Limit salt in your diet.  Avoid heavy  lifting, wear low heals, and sit up straight.  Rest with your legs raised if you have leg cramps or low back pain.  Visit your dentist if you have not gone during your pregnancy. Use a soft toothbrush to brush your teeth. Be gentle when you floss.  You can have sex (intercourse) unless your doctor tells you not to.  Go to your doctor visits. Get help if:  You feel dizzy.  You have mild cramps or pressure in your lower belly (abdomen).  You have a nagging pain in your belly area.  You continue to feel sick to your stomach (nauseous), throw up (vomit), or have watery poop (diarrhea).  You have bad smelling fluid coming from your vagina.  You have pain with peeing (urination). Get help right away if:  You have a fever.  You are leaking fluid from your vagina.  You have spotting or bleeding from your vagina.  You have severe belly cramping or pain.  You lose or gain weight rapidly.  You have trouble catching your breath and have chest pain.  You notice sudden or extreme puffiness (swelling) of your face, hands, ankles, feet, or legs.  You have not felt the baby move in over an hour.  You have severe headaches that do not go away with medicine.  You have vision changes. This information is not intended to replace advice given to you by your health care provider. Make sure you discuss any questions you have with your health care   provider. Document Released: 04/27/2009 Document Revised: 07/09/2015 Document Reviewed: 04/03/2012 Elsevier Interactive Patient Education  2017 Elsevier Inc. Braxton Hicks Contractions Contractions of the uterus can occur throughout pregnancy, but they are not always a sign that you are in labor. You may have practice contractions called Braxton Hicks contractions. These false labor contractions are sometimes confused with true labor. What are Braxton Hicks contractions? Braxton Hicks contractions are tightening movements that occur in the muscles  of the uterus before labor. Unlike true labor contractions, these contractions do not result in opening (dilation) and thinning of the cervix. Toward the end of pregnancy (32-34 weeks), Braxton Hicks contractions can happen more often and may become stronger. These contractions are sometimes difficult to tell apart from true labor because they can be very uncomfortable. You should not feel embarrassed if you go to the hospital with false labor. Sometimes, the only way to tell if you are in true labor is for your health care provider to look for changes in the cervix. The health care provider will do a physical exam and may monitor your contractions. If you are not in true labor, the exam should show that your cervix is not dilating and your water has not broken. If there are no prenatal problems or other health problems associated with your pregnancy, it is completely safe for you to be sent home with false labor. You may continue to have Braxton Hicks contractions until you go into true labor. How can I tell the difference between true labor and false labor?  Differences ? False labor ? Contractions last 30-70 seconds.: Contractions are usually shorter and not as strong as true labor contractions. ? Contractions become very regular.: Contractions are usually irregular. ? Discomfort is usually felt in the top of the uterus, and it spreads to the lower abdomen and low back.: Contractions are often felt in the front of the lower abdomen and in the groin. ? Contractions do not go away with walking.: Contractions may go away when you walk around or change positions while lying down. ? Contractions usually become more intense and increase in frequency.: Contractions get weaker and are shorter-lasting as time goes on. ? The cervix dilates and gets thinner.: The cervix usually does not dilate or become thin. Follow these instructions at home:  Take over-the-counter and prescription medicines only as told by  your health care provider.  Keep up with your usual exercises and follow other instructions from your health care provider.  Eat and drink lightly if you think you are going into labor.  If Braxton Hicks contractions are making you uncomfortable: ? Change your position from lying down or resting to walking, or change from walking to resting. ? Sit and rest in a tub of warm water. ? Drink enough fluid to keep your urine clear or pale yellow. Dehydration may cause these contractions. ? Do slow and deep breathing several times an hour.  Keep all follow-up prenatal visits as told by your health care provider. This is important. Contact a health care provider if:  You have a fever.  You have continuous pain in your abdomen. Get help right away if:  Your contractions become stronger, more regular, and closer together.  You have fluid leaking or gushing from your vagina.  You pass blood-tinged mucus (bloody show).  You have bleeding from your vagina.  You have low back pain that you never had before.  You feel your baby's head pushing down and causing pelvic pressure.  Your baby   is not moving inside you as much as it used to. Summary  Contractions that occur before labor are called Braxton Hicks contractions, false labor, or practice contractions.  Braxton Hicks contractions are usually shorter, weaker, farther apart, and less regular than true labor contractions. True labor contractions usually become progressively stronger and regular and they become more frequent.  Manage discomfort from [facility] contractions by changing position, resting in a warm bath, drinking plenty of water, or practicing deep breathing. This information is not intended to replace advice given to you by your health care provider. Make sure you discuss any questions you have with your health care provider. Document Released: 01/31/2005 Document Revised: 12/21/2015 Document Reviewed: 12/21/2015 Elsevier  Interactive Patient Education  2017 [employer]

## 2016-06-22 NOTE — Progress Notes (Signed)
Diane Day RNC provided Spanish interpretation for check in Spanish video interpreter used for remainder of visit

## 2016-07-19 ENCOUNTER — Encounter (HOSPITAL_COMMUNITY): Payer: Self-pay

## 2016-07-19 ENCOUNTER — Inpatient Hospital Stay (HOSPITAL_COMMUNITY)
Admission: AD | Admit: 2016-07-19 | Discharge: 2016-07-19 | Disposition: A | Discharge status: Home / Self Care | Payer: Medicaid Other | Source: Ambulatory Visit | Attending: Family Medicine | Admitting: Family Medicine

## 2016-07-19 DIAGNOSIS — O2342 Unspecified infection of urinary tract in pregnancy, second trimester: Secondary | ICD-10-CM | POA: Diagnosis present

## 2016-07-19 DIAGNOSIS — Z3689 Encounter for other specified antenatal screening: Secondary | ICD-10-CM

## 2016-07-19 DIAGNOSIS — R109 Unspecified abdominal pain: Secondary | ICD-10-CM | POA: Insufficient documentation

## 2016-07-19 DIAGNOSIS — O288 Other abnormal findings on antenatal screening of mother: Secondary | ICD-10-CM | POA: Diagnosis not present

## 2016-07-19 DIAGNOSIS — O2343 Unspecified infection of urinary tract in pregnancy, third trimester: Secondary | ICD-10-CM | POA: Diagnosis present

## 2016-07-19 DIAGNOSIS — N3000 Acute cystitis without hematuria: Secondary | ICD-10-CM | POA: Diagnosis not present

## 2016-07-19 DIAGNOSIS — Z87891 Personal history of nicotine dependence: Secondary | ICD-10-CM | POA: Diagnosis not present

## 2016-07-19 DIAGNOSIS — Z880 Allergy status to penicillin: Secondary | ICD-10-CM | POA: Insufficient documentation

## 2016-07-19 DIAGNOSIS — Z91013 Allergy to seafood: Secondary | ICD-10-CM | POA: Diagnosis not present

## 2016-07-19 DIAGNOSIS — O26893 Other specified pregnancy related conditions, third trimester: Secondary | ICD-10-CM | POA: Diagnosis present

## 2016-07-19 LAB — URINALYSIS, ROUTINE W REFLEX MICROSCOPIC
Bilirubin Urine: NEGATIVE
Glucose, UA: NEGATIVE mg/dL
Hgb urine dipstick: NEGATIVE
KETONES UR: NEGATIVE mg/dL
Leukocytes, UA: NEGATIVE
Nitrite: POSITIVE — AB
PROTEIN: NEGATIVE mg/dL
Specific Gravity, Urine: 1.013 (ref 1.005–1.030)
pH: 7 (ref 5.0–8.0)

## 2016-07-19 MED ORDER — NITROFURANTOIN MONOHYD MACRO 100 MG PO CAPS: 100.0000 mg | ORAL_CAPSULE | Freq: Two times a day (BID) | ORAL | 0 refills | Status: DC

## 2016-07-19 NOTE — MAU Provider Note (Signed)
Patient Kristy Nguyen is a 26 y.o. G2P0010 at [redacted]w[redacted]d Here with complaints of abdominal irritation on the sides of her belly. It is worse when she has been standing at work; she also feels short of breath when she walks.  Patient is concerned because her friend developed pre-eclampsia and delivered her baby at 54 weeks, and then her baby passed. The patient does not know anything else about this situation and does not know the specifics of her friend's prenatal care.  History     CSN: [id number]  Arrival date and time: 07/19/16 1314   First Provider Initiated Contact with Patient 07/19/16 1421      Chief Complaint  Patient presents with  . Contractions   Abdominal Pain  This is a new problem. The current episode started in the past 7 days. The problem occurs rarely. The problem has been resolved. The pain is located in the periumbilical region (under her ribs). Pain scale: she can't give me a number.  She denies vaginal discharge, bleeding or leaking of fluid. Nothing makes it better or worse and she has tried nothing for the problem.   OB History    Gravida Para Term Preterm AB Living   2 0 0 0 1 0   SAB TAB Ectopic Multiple Live Births   1 0 0 0 0      Past Medical History:  Diagnosis Date  . Allergy   . Medical history non-contributory     Past Surgical History:  Procedure Laterality Date  . NO PAST SURGERIES      Family History  Problem Relation Age of Onset  . Diabetes Maternal Grandmother   . Cancer Paternal Uncle     Social History  Substance Use Topics  . Smoking status: Former Games developer  . Smokeless tobacco: Former Neurosurgeon  . Alcohol use No    Allergies:  Allergies  Allergen Reactions  . Fish Allergy Rash  . Penicillins Rash and Other (See Comments)    Has patient had a PCN reaction causing immediate rash, facial/tongue/throat swelling, SOB or lightheadedness with hypotension: No Has patient had a PCN reaction causing severe rash involving mucus membranes or  skin necrosis: No Has patient had a PCN reaction that required hospitalization No Has patient had a PCN reaction occurring within the last 10 years: No If all of the above answers are "NO", then may proceed with Cephalosporin use.    No prescriptions prior to admission.    Review of Systems  Respiratory: Negative.   Cardiovascular: Negative.   Gastrointestinal: Positive for abdominal pain.  Genitourinary: Negative.   Musculoskeletal: Negative.   Psychiatric/Behavioral: Negative.    Physical Exam   Blood pressure 123/73, pulse 96, temperature 98.6 F (37 C), resp. rate 18, weight 208 lb 6.4 oz (94.5 kg), last menstrual period 12/29/2015, SpO2 96 %.  Physical Exam  Constitutional: She is oriented to person, place, and time. She appears well-developed and well-nourished.  HENT:  Head: Normocephalic.  Neck: Normal range of motion.  Respiratory: Effort normal.  GI: Soft.  Genitourinary: Vagina normal.  Genitourinary Comments: Deferred  Musculoskeletal: Normal range of motion.  Neurological: She is alert and oriented to person, place, and time. She has normal reflexes.  Skin: Skin is warm and dry.  Psychiatric: She has a normal mood and affect.    MAU Course  Procedures  MDM -UA: positive for nitrites; will send for culture -NST: 150 bpm, one contraction otherwise uterine irritability, mod, variability,present acels,  no decels  Assessment  and Plan   1. NST (non-stress test) reactive   2. Acute cystitis without hematuria    2. Patient stable for discharge with recommendation to increase fluids  3. RX for Macrobid sent to pharamcy on file; reviewed when to return to the MAU (fever, flank pain, pain with urination, increased abdominal pain, leaking of fluid) 4. [facility]  Kathryn Lorraine Kooistra CNM 07/19/2016, 3:57 PM

## 2016-07-19 NOTE — Discharge Instructions (Signed)

## 2016-07-19 NOTE — MAU Note (Signed)
Pt presents to MAU with complaints of abdominal cramping for 3 days. Denies any VB or abnormal discharge.

## 2016-07-20 LAB — CULTURE, OB URINE

## 2016-07-21 ENCOUNTER — Encounter: Payer: Self-pay | Admitting: Family Medicine

## 2016-07-21 ENCOUNTER — Ambulatory Visit (INDEPENDENT_AMBULATORY_CARE_PROVIDER_SITE_OTHER): Payer: Medicaid Other | Admitting: Obstetrics & Gynecology

## 2016-07-21 VITALS — BP 113/72 | HR 97 | Wt 209.0 lb

## 2016-07-21 DIAGNOSIS — O9989 Other specified diseases and conditions complicating pregnancy, childbirth and the puerperium: Secondary | ICD-10-CM

## 2016-07-21 DIAGNOSIS — Z23 Encounter for immunization: Secondary | ICD-10-CM | POA: Diagnosis not present

## 2016-07-21 DIAGNOSIS — O99891 Other specified diseases and conditions complicating pregnancy: Secondary | ICD-10-CM

## 2016-07-21 DIAGNOSIS — R8271 Bacteriuria: Secondary | ICD-10-CM

## 2016-07-21 DIAGNOSIS — Z3483 Encounter for supervision of other normal pregnancy, third trimester: Secondary | ICD-10-CM

## 2016-07-21 DIAGNOSIS — O2343 Unspecified infection of urinary tract in pregnancy, third trimester: Secondary | ICD-10-CM | POA: Diagnosis not present

## 2016-07-21 DIAGNOSIS — Z3493 Encounter for supervision of normal pregnancy, unspecified, third trimester: Secondary | ICD-10-CM

## 2016-07-21 NOTE — Progress Notes (Signed)
Patient reports itching all over- denies new use of detergents or foods.

## 2016-07-21 NOTE — Progress Notes (Signed)
   PRENATAL VISIT NOTE  Subjective:itching  Kristy Nguyen is a 26 y.o. G2P0010 at 8332w2d being seen today for ongoing prenatal care.  She is currently monitored for the following issues for this low-risk pregnancy and has Supervision of low-risk pregnancy; Asymptomatic bacteriuria during pregnancy; and Urinary tract infection affecting care of mother in third trimester, antepartum on her problem list.  Patient reports fine rash trunk and extremities and itching.  Contractions: Not present. Vag. Bleeding: None.  Movement: Present. Denies leaking of fluid.   The following portions of the patient's history were reviewed and updated as appropriate: allergies, current medications, past family history, past medical history, past social history, past surgical history and problem list. Problem list updated.  Objective:   Vitals:   07/21/16 0907  BP: 113/72  Pulse: 97  Weight: 209 lb (94.8 kg)    Fetal Status: Fetal Heart Rate (bpm): 136   Movement: Present     General:  Alert, oriented and cooperative. Patient is in no acute distress.  Skin: Skin is warm and dry. No rash noted.   Cardiovascular: Normal heart rate noted  Respiratory: Normal respiratory effort, no problems with respiration noted  Abdomen: Soft, gravid, appropriate for gestational age. Pain/Pressure: Present     Pelvic:  Cervical exam deferred        Extremities: Normal range of motion.  Edema: Trace  Mental Status: Normal mood and affect. Normal behavior. Normal judgment and thought content.   Assessment and Plan:  Pregnancy: G2P0010 at 1632w2d  1. Encounter for supervision of low-risk pregnancy in third trimester Third trimester - CBC - RPR - HIV antibody (with reflex) - Glucose Tolerance, 2 Hours w/1 Hour - Tdap vaccine greater than or equal to 7yo IM - Bile acids, total  2. Urinary tract infection affecting care of mother in third trimester, antepartum  - CBC - RPR - HIV antibody (with reflex) - Glucose  Tolerance, 2 Hours w/1 Hour - Tdap vaccine greater than or equal to 7yo IM  3. Asymptomatic bacteriuria during pregnancy Possible cholestasis vs rash of pregnancy - CBC - RPR - HIV antibody (with reflex) - Glucose Tolerance, 2 Hours w/1 Hour - Tdap vaccine greater than or equal to 7yo IM  Preterm labor symptoms and general obstetric precautions including but not limited to vaginal bleeding, contractions, leaking of fluid and fetal movement were reviewed in detail with the patient. Please refer to After Visit Summary for other counseling recommendations.  Return in about 2 weeks (around 08/04/2016).   Scheryl DarterJames Chalet Kerwin, MD

## 2016-07-21 NOTE — Patient Instructions (Signed)
Tercer trimestre de embarazo (Third Trimester of Pregnancy) El tercer trimestre comprende desde la semana29 hasta la semana42, es decir, desde el mes7 hasta el mes9. El tercer trimestre es un perodo en el que el feto crece rpidamente. Hacia el final del noveno mes, el feto mide alrededor de 20pulgadas (45cm) de largo y pesa entre 6 y 10 libras (2,700 y 4,500kg). CAMBIOS EN EL ORGANISMO Su organismo atraviesa por muchos cambios durante el embarazo, y estos varan de una mujer a otra.  Seguir aumentando de peso. Es de esperar que aumente entre 25 y 35libras (11 y 16kg) hacia el final del embarazo.  Podrn aparecer las primeras estras en las caderas, el abdomen y las mamas.  Puede tener necesidad de orinar con ms frecuencia porque el feto baja hacia la pelvis y ejerce presin sobre la vejiga.  Debido al embarazo podr sentir acidez estomacal con frecuencia.  Puede estar estreida, ya que ciertas hormonas enlentecen los movimientos de los msculos que empujan los desechos a travs de los intestinos.  Pueden aparecer hemorroides o abultarse e hincharse las venas (venas varicosas).  Puede sentir dolor plvico debido al aumento de peso y a que las hormonas del embarazo relajan las articulaciones entre los huesos de la pelvis. El dolor de espalda puede ser consecuencia de la sobrecarga de los msculos que soportan la postura.  Tal vez haya cambios en el cabello que pueden incluir su engrosamiento, crecimiento rpido y cambios en la textura. Adems, a algunas mujeres se les cae el cabello durante o despus del embarazo, o tienen el cabello seco o fino. Lo ms probable es que el cabello se le normalice despus del nacimiento del beb.  Las mamas seguirn creciendo y le dolern. A veces, puede haber una secrecin amarilla de las mamas llamada calostro.  El ombligo puede salir hacia afuera.  Puede sentir que le falta el aire debido a que se expande el tero.  Puede notar que el feto  "baja" o lo siente ms bajo, en el abdomen.  Puede tener una prdida de secrecin mucosa con sangre. Esto suele ocurrir en el trmino de unos pocos das a una semana antes de que comience el trabajo de parto.  El cuello del tero se vuelve delgado y blando (se borra) cerca de la fecha de parto. QU DEBE ESPERAR EN LOS EXMENES PRENATALES Le harn exmenes prenatales cada 2semanas hasta la semana36. A partir de ese momento le harn exmenes semanales. Durante una visita prenatal de rutina:  La pesarn para asegurarse de que usted y el feto estn creciendo normalmente.  Le tomarn la presin arterial.  Le medirn el abdomen para controlar el desarrollo del beb.  Se escucharn los latidos cardacos fetales.  Se evaluarn los resultados de los estudios solicitados en visitas anteriores.  Le revisarn el cuello del tero cuando est prxima la fecha de parto para controlar si este se ha borrado. Alrededor de la semana36, el mdico le revisar el cuello del tero. Al mismo tiempo, realizar un anlisis de las secreciones del tejido vaginal. Este examen es para determinar si hay un tipo de bacteria, estreptococo Grupo B. El mdico le explicar esto con ms detalle. El mdico puede preguntarle lo siguiente:  Cmo le gustara que fuera el parto.  Cmo se siente.  Si siente los movimientos del beb.  Si ha tenido sntomas anormales, como prdida de lquido, sangrado, dolores de cabeza intensos o clicos abdominales.  Si est consumiendo algn producto que contenga tabaco, como cigarrillos, tabaco de mascar y   cigarrillos electrnicos.  Si tiene alguna pregunta. Otros exmenes o estudios de deteccin que pueden realizarse durante el tercer trimestre incluyen lo siguiente:  Anlisis de sangre para controlar los niveles de hierro (anemia).  Controles fetales para determinar su salud, nivel de actividad y crecimiento. Si tiene alguna enfermedad o hay problemas durante el embarazo, le harn  estudios.  Prueba del VIH (virus de inmunodeficiencia humana). Si corre un riesgo alto, pueden realizarle una prueba de deteccin del VIH durante el tercer trimestre del embarazo. FALSO TRABAJO DE PARTO Es posible que sienta contracciones leves e irregulares que finalmente desaparecen. Se llaman contracciones de Braxton Hicks o falso trabajo de parto. Las contracciones pueden durar horas, das o incluso semanas, antes de que el verdadero trabajo de parto se inicie. Si las contracciones ocurren a intervalos regulares, se intensifican o se hacen dolorosas, lo mejor es que la revise el mdico. SIGNOS DE TRABAJO DE PARTO  Clicos de tipo menstrual.  Contracciones cada 5minutos o menos.  Contracciones que comienzan en la parte superior del tero y se extienden hacia abajo, a la zona inferior del abdomen y la espalda.  Sensacin de mayor presin en la pelvis o dolor de espalda.  Una secrecin de mucosidad acuosa o con sangre que sale de la vagina. Si tiene alguno de estos signos antes de la semana37 del embarazo, llame a su mdico de inmediato. Debe concurrir al hospital para que la controlen inmediatamente. INSTRUCCIONES PARA EL CUIDADO EN EL HOGAR  Evite fumar, consumir hierbas, beber alcohol y tomar frmacos que no le hayan recetado. Estas sustancias qumicas afectan la formacin y el desarrollo del beb.  No consuma ningn producto que contenga tabaco, lo que incluye cigarrillos, tabaco de mascar y cigarrillos electrnicos. Si necesita ayuda para dejar de fumar, consulte al mdico. Puede recibir asesoramiento y otro tipo de recursos para dejar de fumar.  Siga las indicaciones del mdico en relacin con el uso de medicamentos. Durante el embarazo, hay medicamentos que son seguros de tomar y otros que no.  Haga ejercicio solamente como se lo haya indicado el mdico. Sentir clicos uterinos es un buen signo para detener la actividad fsica.  Contine comiendo alimentos sanos con  regularidad.  Use un sostn que le brinde buen soporte si le duelen las mamas.  No se d baos de inmersin en agua caliente, baos turcos ni saunas.  Use el cinturn de seguridad en todo momento mientras conduce.  No coma carne cruda ni queso sin cocinar; evite el contacto con las bandejas sanitarias de los gatos y la tierra que estos animales usan. Estos elementos contienen grmenes que pueden causar defectos congnitos en el beb.  Tome las vitaminas prenatales.  Tome entre 1500 y 2000mg de calcio diariamente comenzando en la semana20 del embarazo hasta el parto.  Si est estreida, pruebe un laxante suave (si el mdico lo autoriza). Consuma ms alimentos ricos en fibra, como vegetales y frutas frescos y cereales integrales. Beba gran cantidad de lquido para mantener la orina de tono claro o color amarillo plido.  Dese baos de asiento con agua tibia para aliviar el dolor o las molestias causadas por las hemorroides. Use una crema para las hemorroides si el mdico la autoriza.  Si tiene venas varicosas, use medias de descanso. Eleve los pies durante 15minutos, 3 o 4veces por da. Limite el consumo de sal en su dieta.  Evite levantar objetos pesados, use zapatos de tacones bajos y mantenga una buena postura.  Descanse con las piernas elevadas si tiene   calambres o dolor de cintura.  Visite a su dentista si no lo ha hecho durante el embarazo. Use un cepillo de dientes blando para higienizarse los dientes y psese el hilo dental con suavidad.  Puede seguir manteniendo relaciones sexuales, a menos que el mdico le indique lo contrario.  No haga viajes largos excepto que sea absolutamente necesario y solo con la autorizacin del mdico.  Tome clases prenatales para entender, practicar y hacer preguntas sobre el trabajo de parto y el parto.  Haga un ensayo de la partida al hospital.  Prepare el bolso que llevar al hospital.  Prepare la habitacin del beb.  Concurra a todas  las visitas prenatales segn las indicaciones de su mdico.  SOLICITE ATENCIN MDICA SI:  No est segura de que est en trabajo de parto o de que ha roto la bolsa de las aguas.  Tiene mareos.  Siente clicos leves, presin en la pelvis o dolor persistente en el abdomen.  Tiene nuseas, vmitos o diarrea persistentes.  Observa una secrecin vaginal con mal olor.  Siente dolor al orinar.  SOLICITE ATENCIN MDICA DE INMEDIATO SI:  Tiene fiebre.  Tiene una prdida de lquido por la vagina.  Tiene sangrado o pequeas prdidas vaginales.  Siente dolor intenso o clicos en el abdomen.  Sube o baja de peso rpidamente.  Tiene dificultad para respirar y siente dolor de pecho.  Sbitamente se le hinchan mucho el rostro, las manos, los tobillos, los pies o las piernas.  No ha sentido los movimientos del beb durante una hora.  Siente un dolor de cabeza intenso que no se alivia con medicamentos.  Su visin se modifica.  Esta informacin no tiene como fin reemplazar el consejo del mdico. Asegrese de hacerle al mdico cualquier pregunta que tenga. Document Released: 11/10/2004 Document Revised: 02/21/2014 Document Reviewed: 04/03/2012 Elsevier Interactive Patient Education  2017 Elsevier Inc.  

## 2016-07-22 LAB — CBC
Hematocrit: 34.5 % (ref 34.0–46.6)
Hemoglobin: 11.4 g/dL (ref 11.1–15.9)
MCH: 28.9 pg (ref 26.6–33.0)
MCHC: 33 g/dL (ref 31.5–35.7)
MCV: 88 fL (ref 79–97)
PLATELETS: 363 10*3/uL (ref 150–379)
RBC: 3.94 x10E6/uL (ref 3.77–5.28)
RDW: 12.7 % (ref 12.3–15.4)
WBC: 11.5 10*3/uL — AB (ref 3.4–10.8)

## 2016-07-22 LAB — HIV ANTIBODY (ROUTINE TESTING W REFLEX): HIV Screen 4th Generation wRfx: NONREACTIVE

## 2016-07-22 LAB — GLUCOSE TOLERANCE, 2 HOURS W/ 1HR
GLUCOSE, FASTING: 95 mg/dL — AB (ref 65–91)
Glucose, 1 hour: 192 mg/dL — ABNORMAL HIGH (ref 65–179)
Glucose, 2 hour: 150 mg/dL (ref 65–152)

## 2016-07-22 LAB — RPR: RPR: NONREACTIVE

## 2016-07-23 LAB — BILE ACIDS, TOTAL: BILE ACIDS TOTAL: 7.7 umol/L (ref 4.7–24.5)

## 2016-07-25 ENCOUNTER — Other Ambulatory Visit: Payer: Self-pay | Admitting: Student

## 2016-08-05 ENCOUNTER — Ambulatory Visit (INDEPENDENT_AMBULATORY_CARE_PROVIDER_SITE_OTHER): Payer: Medicaid Other | Admitting: Obstetrics and Gynecology

## 2016-08-05 VITALS — BP 110/60 | HR 101 | Wt 209.6 lb

## 2016-08-05 DIAGNOSIS — Z8632 Personal history of gestational diabetes: Secondary | ICD-10-CM | POA: Insufficient documentation

## 2016-08-05 DIAGNOSIS — O24410 Gestational diabetes mellitus in pregnancy, diet controlled: Secondary | ICD-10-CM

## 2016-08-05 DIAGNOSIS — Z3493 Encounter for supervision of normal pregnancy, unspecified, third trimester: Secondary | ICD-10-CM

## 2016-08-05 DIAGNOSIS — R21 Rash and other nonspecific skin eruption: Secondary | ICD-10-CM

## 2016-08-05 DIAGNOSIS — O24419 Gestational diabetes mellitus in pregnancy, unspecified control: Secondary | ICD-10-CM | POA: Insufficient documentation

## 2016-08-05 DIAGNOSIS — O2343 Unspecified infection of urinary tract in pregnancy, third trimester: Secondary | ICD-10-CM

## 2016-08-05 LAB — POCT URINALYSIS DIP (DEVICE)
Bilirubin Urine: NEGATIVE
Glucose, UA: NEGATIVE mg/dL
Ketones, ur: NEGATIVE mg/dL
Nitrite: NEGATIVE
Protein, ur: NEGATIVE mg/dL
Specific Gravity, Urine: 1.02 (ref 1.005–1.030)
Urobilinogen, UA: 0.2 mg/dL (ref 0.0–1.0)
pH: 7 (ref 5.0–8.0)

## 2016-08-05 MED ORDER — ACCU-CHEK FASTCLIX LANCETS MISC: 1.0000 | Freq: Four times a day (QID) | 12 refills | Status: DC

## 2016-08-05 MED ORDER — ACCU-CHEK AVIVA PLUS W/DEVICE KIT: 1.0000 | PACK | Freq: Once | 0 refills | Status: AC

## 2016-08-05 MED ORDER — GLUCOSE BLOOD VI STRP: 1.0000 | ORAL_STRIP | Freq: Four times a day (QID) | 12 refills | Status: DC

## 2016-08-05 NOTE — Addendum Note (Signed)
Addended by: Garret ReddishBARNES, Peace Noyes M on: 08/05/2016 12:17 PM   Modules accepted: Orders

## 2016-08-05 NOTE — Patient Instructions (Signed)
Diagnstico de diabetes mellitus gestacional (Gestational Diabetes Mellitus, Diagnosis) La diabetes gestacional (diabetes mellitus gestacional) es una forma temporal de diabetes que algunas mujeres desarrollan durante el Media planner. Generalmente, ocurre alrededor [place] 24 a la 28de gestacin y desaparece despus del parto. Los cambios hormonales durante el embarazo pueden interferir en la produccin y la actividad de la [place], lo que puede derivar en uno de estos problemas o en ambos:  El pncreas no produce la cantidad suficiente de una hormona llamada insulina.  Las clulas del cuerpo no responden de [place] a la insulina que el organismo produce (resistencia a la insulina). Normalmente, la insulina estimula el ingreso de la glucosa en las clulas del cuerpo. Las clulas usan la glucosa para Dealer. La resistencia a la insulina o la falta de esta hormona hace que el exceso de glucosa se acumule en la sangre, en lugar de ir a las clulas. Como resultado, aumenta la glucemia (hiperglucemia). CULES SON LOS RIESGOS? Si la diabetes gestacional se trata, hay pocas probabilidades de que ocasione problemas. Si no se la controla con tratamiento, puede causar problemas durante el [place] de parto y [place], y algunos de esos problemas pueden ser dainos para el feto y la [place]. La diabetes gestacional que no se controla tambin puede producir problemas respiratorios y bajo nivel de glucemia en el recin nacido. Las mujeres que tienen diabetes gestacional son ms propensas a Lawyer afeccin si se embarazan de nuevo y a tener diabetes tipo2 en el futuro. QU INCREMENTA EL RIESGO? Es ms probable que Orthoptist en las mujeres embarazadas que:  Son [place] de 25aos durante el [place].  Tienen antecedentes familiares de diabetes.  Tienen sobrepeso.  Tuvieron diabetes gestacional en el pasado.  Tienen sndrome de ovario poliqustico (SOP).  Tienen  un embarazo gemelar o un embarazo mltiple.  Son descendientes de indgenas norteamericanos, afroamericanos, hispanos o latinos, o asiticos o isleos del [place] LOS SIGNOS O LOS SNTOMAS? La mayora de las mujeres no perciben los sntomas de la diabetes gestacional porque son similares a los sntomas normales del Media planner. Los sntomas de la diabetes gestacional pueden incluir, entre otros:  Aumento de la sed (polidipsia).  Aumento del apetito (polifagia).  Aumento de la miccin (poliuria). [place] SE DIAGNOSTICA? Esta afeccin se puede diagnosticar en funcin del nivel de glucemia que puede medirse con uno o ms de los siguientes anlisis de sangre:  Medicin de la glucemia en [place]. No se le permitir comer (tendr que [employer]) durante al menos 8horas antes de que se tome una [place] de [place].  Prueba al azar de la glucemia. Esta prueba mide la glucemia en cualquier momento del da, sin importar cundo comi.  Prueba de tolerancia a la glucosa oral (PTGO). Generalmente, se realiza [place] [employer] 24 a la 28de gestacin. ? Para esta prueba, le harn una medicin de la glucemia en ayunas. Luego, tomar una bebida que contiene glucosa. Se analizar el nivel de glucemia nuevamente 1hora despus de tomar la bebida con glucosa (PTGO a la hora). ? Si el resultado de la PTGO a la hora es igual o superior a 140mg /dl (7,13mmol/l), le repetirn la PTGO. Esta vez, se analizar el nivel de glucemia 3horas despus de tomar la bebida con glucosa (PTGO a las 3horas). Si tiene factores de [place], pueden hacerle pruebas de deteccin de la diabetes tipo2 no diagnosticada en la primera visita de atencin Medical sales representative (visita prenatal). CMO SE TRATA ESTA AFECCIN?  El tratamiento puede estar a cargo de Research officer, trade union. Para tratar esta afeccin, debe seguir las indicaciones del mdico respecto de lo siguiente:  Comer una dieta ms saludable y  aumentar la actividad fsica. Estos cambios son lo ms importante para mantener la diabetes gestacional bajo control.  Controlarse la glucemia. Hgalo con la frecuencia que le hayan indicado.  Tomar los medicamentos para la diabetes o aplicarse la [employer]. Estos frmacos se recetarn solamente si son necesarios. ? Si [place] insulina, tal vez deba ajustar las dosis en funcin de la cantidad de actividad fsica que realiza y de los alimentos que consume. El mdico le indicar cmo hacerlo. El mdico [employer] objetivos del tratamiento para usted sobre la base de la etapa del Media planner en la que se encuentra y de cualquier otra afeccin que padezca. Generalmente, el objetivo del tratamiento es [employer] siguientes niveles de glucemia durante el embarazo:  En ayunas: igual o menor que 95mg /dl (5,90mmol/l).  Despus de las comidas (posprandial): ? Una hora despus de una comida: igual o menor que 140mg /dl (7,78mmol/l). ? Dos horas despus de una comida: igual o menor que 120mg /dl (6,50mmol/l).  Nivel de A1c (hemoglobinaA1c): del 6% al 6,5%. SIGA ESTAS INDICACIONES EN SU CASA: Preguntas para hacerle al mdico Considere la posibilidad de hacer las siguientes preguntas:  Debo reunirme con Radio broadcast assistant para el cuidado de la diabetes?  Dnde puedo encontrar un grupo de apoyo para personas diabticas?  Qu equipos necesitar para controlar la diabetes en casa?  Qu medicamentos para la diabetes necesito y cundo debo tomarlos?  Con qu frecuencia debo controlarme la glucemia?  A qu nmero puedo llamar si tengo preguntas?  Cundo es mi prxima cita? Instrucciones generales  [employer] de venta libre y los recetados solamente como se lo haya indicado el mdico.  Controle el aumento de peso durante el [place]. La cantidad de [employer] se espera que aumente depende de su [place] (ndice de masa corporal) antes del embarazo.  Concurra a todas las  visitas de control como se lo haya indicado el mdico. Esto es importante.  Para obtener ms informacin sobre la diabetes, visite los siguientes sitios: ? Hotel manager de la Diabetes (American Diabetes Association, ADA): www.diabetes.org ? Asociacin Norteamericana de Instructores para el Cuidado de la Diabetes (American Association of Diabetes Educators, AADE): www.diabeteseducator.org/patient-resources COMUNQUESE CON UN MDICO SI:  El nivel de glucemia es igual o superior a 240mg /dl (13,72mmol/l).  El nivel de glucemia es igual o superior a 200mg /dl (11,42mmol/l), y tiene cetonas en la orina.  Ha estado enferma o ha tenido fiebre durante 2o ms das y no [place].  Tiene alguno de los siguientes problemas durante ms de 6horas: ? No puede comer ni beber. ? Tiene nuseas y vmitos. ? Tiene diarrea.  SOLICITE AYUDA DE INMEDIATO SI:  Su nivel de glucosa en la sangre est por debajo de 54mg /dl (20mmol/l).  Est confundida o tiene dificultad para pensar con claridad.  Tiene dificultad para respirar.  Tiene un nivel moderado o alto de cetonas en la [place].  El beb se mueve menos de lo habitual.  Tiene secreciones inusuales o sangrado de la vagina.  Comienza a tener contracciones antes de tiempo (prematuramente). Las contracciones se pueden sentir como un endurecimiento de la parte inferior del abdomen.  Esta informacin no tiene Marine scientist el consejo del mdico. Asegrese de hacerle al mdico cualquier pregunta que tenga. Document Released: 11/10/2004 Document Revised: 05/25/2015 Document Reviewed: 03/06/2015 Elsevier Interactive  Patient Education  2017 Elsevier Inc.  

## 2016-08-05 NOTE — Addendum Note (Signed)
Addended by: Garret ReddishBARNES, Moranda Billiot M on: 08/05/2016 12:02 PM   Modules accepted: Orders

## 2016-08-05 NOTE — Progress Notes (Signed)
Subjective:  Kristy Nguyen is a 26 y.o. G2P0010 at 65w3dbeing seen today for ongoing prenatal care.  She is currently monitored for the following issues for this high-risk pregnancy and has Supervision of low-risk pregnancy; Urinary tract infection affecting care of mother in third trimester, antepartum; Rash; and DM (diabetes mellitus), gestational on her problem list.  Patient reports continues with rash and some dysuria. .  Contractions: Irritability. Vag. Bleeding: None.  Movement: Present. Denies leaking of fluid.   The following portions of the patient's history were reviewed and updated as appropriate: allergies, current medications, past family history, past medical history, past social history, past surgical history and problem list. Problem list updated.  Objective:   Vitals:   08/05/16 1115  BP: 110/60  Pulse: (!) 101  Weight: 209 lb 9.6 oz (95.1 kg)    Fetal Status: Fetal Heart Rate (bpm): 142   Movement: Present     General:  Alert, oriented and cooperative. Patient is in no acute distress.  Skin: Skin is warm and dry. No rash noted.   Cardiovascular: Normal heart rate noted  Respiratory: Normal respiratory effort, no problems with respiration noted  Abdomen: Soft, gravid, appropriate for gestational age. Pain/Pressure: Present     Pelvic:  Cervical exam deferred        Extremities: Normal range of motion.  Edema: None  Mental Status: Normal mood and affect. Normal behavior. Normal judgment and thought content.   Urinalysis:      Assessment and Plan:  Pregnancy: G2P0010 at 332w3d1. Encounter for supervision of low-risk pregnancy in third trimester Stable  2. Urinary tract infection affecting care of mother in third trimester, antepartum  - Culture, OB Urine  3. Rash Will try Benadryl and OTC steroid cream until labs return - Comp Met (CMET) - Bile acids, total  4. Diet controlled gestational diabetes mellitus (GDM) in third trimester GDM reviewed with  pt Refer to DM educator BS goals reviewed with.  - Hemoglobin A1c  Preterm labor symptoms and general obstetric precautions including but not limited to vaginal bleeding, contractions, leaking of fluid and fetal movement were reviewed in detail with the patient. Please refer to After Visit Summary for other counseling recommendations.  No Follow-up on file.   ErChancy MilroyMD

## 2016-08-05 NOTE — Progress Notes (Signed)
Spanish video interpreter "Nela" [id number] used for visit Pt c/o lower abd cramping. Has occasional burning with urination U/a has trace wbcs, trace blood Itchy rash to abdomen

## 2016-08-07 LAB — COMPREHENSIVE METABOLIC PANEL
ALT: 6 IU/L (ref 0–32)
AST: 11 IU/L (ref 0–40)
Albumin/Globulin Ratio: 1.2 (ref 1.2–2.2)
Albumin: 3.9 g/dL (ref 3.5–5.5)
Alkaline Phosphatase: 138 IU/L — ABNORMAL HIGH (ref 39–117)
BUN/Creatinine Ratio: 10 (ref 9–23)
BUN: 5 mg/dL — ABNORMAL LOW (ref 6–20)
Bilirubin Total: 0.2 mg/dL (ref 0.0–1.2)
CALCIUM: 10.3 mg/dL — AB (ref 8.7–10.2)
CO2: 20 mmol/L (ref 20–29)
CREATININE: 0.48 mg/dL — AB (ref 0.57–1.00)
Chloride: 100 mmol/L (ref 96–106)
GFR, EST AFRICAN AMERICAN: 157 mL/min/{1.73_m2} (ref >59)
GFR, EST NON AFRICAN AMERICAN: 136 mL/min/{1.73_m2} (ref >59)
GLOBULIN, TOTAL: 3.2 g/dL (ref 1.5–4.5)
Glucose: 90 mg/dL (ref 65–99)
POTASSIUM: 4.3 mmol/L (ref 3.5–5.2)
Sodium: 136 mmol/L (ref 134–144)
TOTAL PROTEIN: 7.1 g/dL (ref 6.0–8.5)

## 2016-08-07 LAB — URINE CULTURE, OB REFLEX

## 2016-08-07 LAB — BILE ACIDS, TOTAL: Bile Acids Total: 6.2 umol/L (ref 4.7–24.5)

## 2016-08-07 LAB — CULTURE, OB URINE

## 2016-08-11 ENCOUNTER — Encounter (HOSPITAL_COMMUNITY): Payer: Self-pay

## 2016-08-11 ENCOUNTER — Encounter: Payer: Self-pay | Admitting: Pediatric Intensive Care

## 2016-08-11 ENCOUNTER — Emergency Department (HOSPITAL_COMMUNITY)
Admission: EM | Admit: 2016-08-11 | Discharge: 2016-08-11 | Disposition: A | Discharge status: Home / Self Care | Payer: Medicaid Other | Attending: Emergency Medicine | Admitting: Emergency Medicine

## 2016-08-11 DIAGNOSIS — E119 Type 2 diabetes mellitus without complications: Secondary | ICD-10-CM | POA: Diagnosis not present

## 2016-08-11 DIAGNOSIS — O26893 Other specified pregnancy related conditions, third trimester: Secondary | ICD-10-CM | POA: Insufficient documentation

## 2016-08-11 DIAGNOSIS — O99513 Diseases of the respiratory system complicating pregnancy, third trimester: Secondary | ICD-10-CM | POA: Insufficient documentation

## 2016-08-11 DIAGNOSIS — Z794 Long term (current) use of insulin: Secondary | ICD-10-CM | POA: Insufficient documentation

## 2016-08-11 DIAGNOSIS — Z3A34 34 weeks gestation of pregnancy: Secondary | ICD-10-CM | POA: Insufficient documentation

## 2016-08-11 DIAGNOSIS — Z87891 Personal history of nicotine dependence: Secondary | ICD-10-CM | POA: Insufficient documentation

## 2016-08-11 DIAGNOSIS — R109 Unspecified abdominal pain: Secondary | ICD-10-CM | POA: Diagnosis not present

## 2016-08-11 DIAGNOSIS — J069 Acute upper respiratory infection, unspecified: Secondary | ICD-10-CM | POA: Diagnosis not present

## 2016-08-11 LAB — CBC WITH DIFFERENTIAL/PLATELET
Basophils Absolute: 0 10*3/uL (ref 0.0–0.1)
Basophils Relative: 0 %
Eosinophils Absolute: 0.1 10*3/uL (ref 0.0–0.7)
Eosinophils Relative: 1 %
HEMATOCRIT: 35.9 % — AB (ref 36.0–46.0)
HEMOGLOBIN: 11.6 g/dL — AB (ref 12.0–15.0)
LYMPHS PCT: 11 %
Lymphs Abs: 1.3 10*3/uL (ref 0.7–4.0)
MCH: 28.5 pg (ref 26.0–34.0)
MCHC: 32.3 g/dL (ref 30.0–36.0)
MCV: 88.2 fL (ref 78.0–100.0)
MONOS PCT: 7 %
Monocytes Absolute: 0.9 10*3/uL (ref 0.1–1.0)
NEUTROS ABS: 9.6 10*3/uL — AB (ref 1.7–7.7)
NEUTROS PCT: 81 %
Platelets: 288 10*3/uL (ref 150–400)
RBC: 4.07 MIL/uL (ref 3.87–5.11)
RDW: 13.2 % (ref 11.5–15.5)
WBC: 11.9 10*3/uL — ABNORMAL HIGH (ref 4.0–10.5)

## 2016-08-11 LAB — CBG MONITORING, ED: Glucose-Capillary: 133 mg/dL — ABNORMAL HIGH (ref 65–99)

## 2016-08-11 LAB — RPR: RPR Ser Ql: NONREACTIVE

## 2016-08-11 MED ORDER — SODIUM CHLORIDE 0.9 % IV BOLUS (SEPSIS)
1000.0000 mL | Freq: Once | INTRAVENOUS | Status: AC
  Administered 2016-08-11: 1000 mL via INTRAVENOUS

## 2016-08-11 NOTE — ED Notes (Signed)
Placed pt on toco monitor. Fetal HR 145-150.

## 2016-08-11 NOTE — ED Provider Notes (Signed)
[facility]-EMER[facility]ENCY DEPT Provider Note   CSN: [id number] Arrival date & time: 08/11/16  0820     History   Chief Complaint Chief Complaint  Patient presents with  . Fever    HPI Kristy Nguyen is a 26 y.o. female.  HPI  Pt presenting with c/o intermittent abdominal pain.  She states that she is [redacted] weeks pregnant.  She felt a gush of fluid last night when she urinated that was warm.  She was having intermittent abdominal pains last night.  Not currently having abdominal pain.  She states she had been having pain in her abdomen and her lower back over the past 2 days.  She has gotten some prenatal care, at [facility] hospital but does not remember the name of her OB.  She also has had some URI symptoms and felt subjective fever yesterday.  No dysuria.  Has had some morning emesis over the past several months but not worse than usual during her pregnancy. Pt is G1P0  Past Medical History:  Diagnosis Date  . Allergy   . Medical history non-contributory     Patient Active Problem List   Diagnosis Date Noted  . Rash 08/05/2016  . DM (diabetes mellitus), gestational 08/05/2016  . Urinary tract infection affecting care of mother in third trimester, antepartum 07/19/2016  . Supervision of low-risk pregnancy 04/05/2016    Past Surgical History:  Procedure Laterality Date  . NO PAST SURGERIES      OB History    Gravida Para Term Preterm AB Living   2 0 0 0 1 0   SAB TAB Ectopic Multiple Live Births   1 0 0 0 0       Home Medications    Prior to Admission medications   Medication Sig Start Date End Date Taking? Authorizing Provider  ACCU-CHEK FASTCLIX LANCETS MISC 1 each by Percutaneous route 4 (four) times daily. 08/05/16   Hermina Staggers, MD  glucose blood (ACCU-CHEK AVIVA PLUS) test strip 1 each by Other route 4 (four) times daily. Use as instructed 08/05/16   Hermina Staggers, MD  nitrofurantoin, macrocrystal-monohydrate, (MACROBID) 100 MG capsule Take 1 capsule (100 mg total)  by mouth 2 (two) times daily. Patient not taking: Reported on 07/21/2016 07/19/16   Marylene Land, CNM  Prenatal Multivit-Min-Fe-FA (PRENATAL VITAMINS) 0.8 MG tablet Take 1 tablet by mouth daily. 05/17/16   Marny Lowenstein, PA-C    Family History Family History  Problem Relation Age of Onset  . Diabetes Maternal Grandmother   . Cancer Paternal Uncle     Social History Social History  Substance Use Topics  . Smoking status: Former Games developer  . Smokeless tobacco: Former Neurosurgeon  . Alcohol use No     Allergies   Fish allergy and Penicillins   Review of Systems Review of Systems  ROS reviewed and all otherwise negative except for mentioned in HPI   Physical Exam Updated Vital Signs BP 126/73 (BP Location: Left Arm)   Pulse (!) 119   Temp 98.1 F (36.7 C) (Oral)   Resp 20   Ht 5\' 4"  (1.626 m)   Wt 95.3 kg (210 lb)   LMP 12/29/2015   SpO2 100%   BMI 36.05 kg/m  Vitals reviewed Physical Exam Physical Examination: General appearance - alert, well appearing, and in no distress Mental status - alert, oriented to person, place, and time Eyes -no conjunctival injection, no scleral icterus Mouth - mucous membranes moist, pharynx normal without lesions Neck - supple,  no significant adenopathy Chest - clear to auscultation, no wheezes, rales or rhonchi, symmetric air entry Heart - normal rate, regular rhythm, normal S1, S2, no murmurs, rubs, clicks or gallops Abdomen - soft, gravid above level of umbilicus, nontender, Pelvic- deffered to rapid OB nurse  Neurological - alert, oriented, normal speech, normal gait Extremities - peripheral pulses normal, no pedal edema, no clubbing or cyanosis Skin - normal coloration and turgor, no rashes  ED Treatments / Results  Labs (all labs ordered are listed, but only abnormal results are displayed) Labs Reviewed  CBC WITH DIFFERENTIAL/PLATELET - Abnormal; Notable for the following:       Result Value   WBC 11.9 (*)    Hemoglobin  11.6 (*)    HCT 35.9 (*)    Neutro Abs 9.6 (*)    All other components within normal limits  CBG MONITORING, ED - Abnormal; Notable for the following:    Glucose-Capillary 133 (*)    All other components within normal limits  RPR    EKG  EKG Interpretation None       Radiology No results found.  Procedures Procedures (including critical care time)  Medications Ordered in ED Medications  sodium chloride 0.9 % bolus 1,000 mL (0 mLs Intravenous Stopped 08/11/16 1057)     Initial Impression / Assessment and Plan / ED Course  I have reviewed the triage vital signs and the nursing notes.  Pertinent labs & imaging results that were available during my care of the patient were reviewed by me and considered in my medical decision making (see chart for details).    10:34 AM rapid response nurse has had patient cleared for discharge- we are awaiting urine and will give IV fluid bolus- although per OB her HR has been running high during pregnancy.  On OB nurse exam, cervix is closed/high.     Final Clinical Impressions(s) / ED Diagnoses   Final diagnoses:  Abdominal pain during pregnancy in third trimester  Viral URI    New Prescriptions Discharge Medication List as of 08/11/2016 10:37 AM       Jerelyn ScottLinker, Maedell Hedger, MD 08/12/16 1338

## 2016-08-11 NOTE — ED Notes (Signed)
[facility] has been called

## 2016-08-11 NOTE — Discharge Instructions (Signed)
Return to the ED with any concerns including vaginal bleeding, increased abdominal pain, vomiting and not able to keep down liquids, or any other alarming symptoms

## 2016-08-11 NOTE — ED Notes (Signed)
[facility] RN at bedside.  

## 2016-08-11 NOTE — ED Triage Notes (Signed)
Per Pt, Pt is coming from home with reports of fevers yesterday. Pt reports being eight months pregnant and feeling a "gush" of water after she had urinated yesterday. Pt reports having on and off contraction ins her lower abdomen and back since Monday. Feels pressure at this time. Denies pain at this time. Pt reports some prenatal care.

## 2016-08-16 ENCOUNTER — Ambulatory Visit (INDEPENDENT_AMBULATORY_CARE_PROVIDER_SITE_OTHER): Payer: Medicaid Other | Admitting: Obstetrics and Gynecology

## 2016-08-16 ENCOUNTER — Encounter: Payer: Self-pay | Admitting: Obstetrics and Gynecology

## 2016-08-16 VITALS — BP 128/79 | HR 101 | Wt 210.4 lb

## 2016-08-16 DIAGNOSIS — O24410 Gestational diabetes mellitus in pregnancy, diet controlled: Secondary | ICD-10-CM

## 2016-08-16 DIAGNOSIS — Z3493 Encounter for supervision of normal pregnancy, unspecified, third trimester: Secondary | ICD-10-CM

## 2016-08-16 MED ORDER — COMFORT FIT MATERNITY SUPP MED MISC: 0 refills | Status: DC

## 2016-08-16 NOTE — Progress Notes (Signed)
Diabetes Education scheduled July 5th @ 1600.  Pt notified.

## 2016-08-16 NOTE — Progress Notes (Signed)
   PRENATAL VISIT NOTE  Subjective:  Kristy Nguyen is a 26 y.o. G2P0010 at 36w0dbeing seen today for ongoing prenatal care.  She is currently monitored for the following issues for this high-risk pregnancy and has Supervision of low-risk pregnancy; Urinary tract infection affecting care of mother in third trimester, antepartum; Rash; and DM (diabetes mellitus), gestational on her problem list.  Patient reports no complaints.  Contractions: Irritability. Vag. Bleeding: None.  Movement: Present. Denies leaking of fluid.   The following portions of the patient's history were reviewed and updated as appropriate: allergies, current medications, past family history, past medical history, past social history, past surgical history and problem list. Problem list updated.  Objective:   Vitals:   08/16/16 1553  BP: 128/79  Pulse: (!) 101  Weight: 210 lb 6.4 oz (95.4 kg)    Fetal Status: Fetal Heart Rate (bpm): 145 Fundal Height: 34 cm Movement: Present     General:  Alert, oriented and cooperative. Patient is in no acute distress.  Skin: Skin is warm and dry. No rash noted.   Cardiovascular: Normal heart rate noted  Respiratory: Normal respiratory effort, no problems with respiration noted  Abdomen: Soft, gravid, appropriate for gestational age. Pain/Pressure: Present     Pelvic:  Cervical exam deferred        Extremities: Normal range of motion.  Edema: Trace  Mental Status: Normal mood and affect. Normal behavior. Normal judgment and thought content.   Assessment and Plan:  Pregnancy: G2P0010 at 356w0d1. Diet controlled gestational diabetes mellitus (GDM) in third trimester Patient has not met with diabetic educator. She picked up testing supplies 2 days ago - Discussed dietary modifications and incorporating exercise to help control her CBG - Discussed testing 4 times daily.  - Reviewed risks associated with poorly controlled diabetes in pregnancy - Will have patient come in 1 week  to review CBG and initiate medication if needed  2. Encounter for supervision of low-risk pregnancy in third trimester - Culture, OB Urine  Preterm labor symptoms and general obstetric precautions including but not limited to vaginal bleeding, contractions, leaking of fluid and fetal movement were reviewed in detail with the patient. Please refer to After Visit Summary for other counseling recommendations.  [facility]around [facility]23[facility]2016[facility]  PeMora BellmanMD

## 2016-08-16 NOTE — Addendum Note (Signed)
Addended by: Faythe CasaBELLAMY, Tyronica Truxillo M on: 08/16/2016 04:48 PM   Modules accepted: Kipp BroodSmartSet

## 2016-08-18 ENCOUNTER — Ambulatory Visit (HOSPITAL_COMMUNITY)
Admission: RE | Admit: 2016-08-18 | Discharge: 2016-08-18 | Disposition: A | Discharge status: Home / Self Care | Payer: Medicaid Other | Source: Ambulatory Visit | Attending: Student | Admitting: Student

## 2016-08-18 ENCOUNTER — Encounter: Payer: Medicaid Other | Attending: Obstetrics and Gynecology | Admitting: *Deleted

## 2016-08-18 DIAGNOSIS — Z713 Dietary counseling and surveillance: Secondary | ICD-10-CM | POA: Insufficient documentation

## 2016-08-18 DIAGNOSIS — O24410 Gestational diabetes mellitus in pregnancy, diet controlled: Secondary | ICD-10-CM | POA: Insufficient documentation

## 2016-08-18 DIAGNOSIS — R7309 Other abnormal glucose: Secondary | ICD-10-CM

## 2016-08-18 NOTE — Progress Notes (Signed)
  Patient was seen on 08/18/2016 for Gestational Diabetes self-management. She is here with her mother,who participated in the visit and a [place] interpretor, Erika.  The following learning objectives were met by the patient :   States the definition of Gestational Diabetes  States why dietary management is important in controlling blood glucose  Describes the effects of carbohydrates on blood glucose levels  Demonstrates ability to create a balanced meal plan  Demonstrates carbohydrate counting   States when to check blood glucose levels  Demonstrates proper blood glucose monitoring techniques  States the effect of stress and exercise on blood glucose levels  States the importance of limiting caffeine and abstaining from alcohol and smoking  Plan:  Aim for 3 Carb Choices per meal (45 grams) +/- 1 either way  Aim for 1-2 Carbs per snack Begin reading food labels for Total Carbohydrate of foods Consider  increasing your activity level by walking or other activity daily as tolerated Begin checking BG before breakfast and 2 hours after first bite of breakfast, lunch and dinner as directed by MD  Bring Log Book to every medical appointment   Take medication if directed by MD  Blood glucose monitor given: Accu Chek Guide Lot # [id number] Exp: 06/06/2017 Blood glucose reading: 94 mg/dl Pt instructed to call OB office in AM to get Rx for Accu Chek Guide Strips and Fast Clix Drum lancets  Patient instructed to monitor glucose levels: FBS: 60 - 95 mg/dl 2 hour: <120 mg/dl  Patient received the following handouts in Spanish:  Nutrition Diabetes and Pregnancy  Carbohydrate Counting List  Patient will be seen for follow-up as needed.

## 2016-08-18 NOTE — Addendum Note (Signed)
Addended by: Faythe CasaBELLAMY, Revecca Nachtigal M on: 08/18/2016 09:23 AM   Modules accepted: Orders

## 2016-08-19 LAB — URINE CULTURE, OB REFLEX

## 2016-08-19 LAB — CULTURE, OB URINE

## 2016-08-19 NOTE — Congregational Nurse Program (Signed)
Congregational Nurse Program Note  Date of Encounter: 08/11/2016  Past Medical History: Past Medical History:  Diagnosis Date  . Allergy   . Medical history non-contributory     Encounter Details:     CNP Questionnaire - 08/11/16 1600      Patient Demographics   Is this a new or existing patient? New   Patient is considered a/an Immigrant   Race Latino/Hispanic     Patient Assistance   Location of Patient Assistance [employer]   Patient's financial/insurance status Self-Pay (Uninsured)   Uninsured Patient ([place] Card/Care Connects) Yes   Interventions Follow-up/Education/Support provided after completed appt.   Patient referred to apply for the following financial assistance Not Applicable   Food insecurities addressed Not Applicable   Transportation assistance No   Assistance securing medications No   Educational health offerings Acute disease     Encounter Details   Primary purpose of visit Acute Illness/Condition Visit   Was an Emergency Department visit averted? Not Applicable   Does patient have a medical provider? Yes   Patient referred to Follow up with established PCP   Was a mental health screening completed? (GAINS tool) No   Does patient have dental issues? No   Does patient have vision issues? No   Does your patient have an abnormal blood pressure today? No   Since previous encounter, have you referred patient for abnormal blood pressure that resulted in a new diagnosis or medication change? No   Does your patient have an abnormal blood glucose today? No   Since previous encounter, have you referred patient for abnormal blood glucose that resulted in a new diagnosis or medication change? No   Was there a life-saving intervention made? No         Clinical Intake - 08/16/16 1619      Language Assistant   Interpreter Needed? Yes   Interpreter Agency [employer]   Interpreter Name Durwin GlazeLaura   Interpreter ID [id number]     Via interpreter Dory, client has had  cold symptoms for about a week. Client just came from ED visit. CN advised supportive therapy for cold symptoms and follow up with OB for any other concerns.

## 2016-08-31 ENCOUNTER — Ambulatory Visit (INDEPENDENT_AMBULATORY_CARE_PROVIDER_SITE_OTHER): Payer: Medicaid Other | Admitting: Advanced Practice Midwife

## 2016-08-31 ENCOUNTER — Other Ambulatory Visit (HOSPITAL_COMMUNITY)
Admission: RE | Admit: 2016-08-31 | Discharge: 2016-08-31 | Disposition: A | Discharge status: Home / Self Care | Payer: Medicaid Other | Source: Ambulatory Visit | Attending: Advanced Practice Midwife | Admitting: Advanced Practice Midwife

## 2016-08-31 ENCOUNTER — Encounter: Payer: Self-pay | Admitting: Family Medicine

## 2016-08-31 ENCOUNTER — Encounter: Payer: Self-pay | Admitting: Advanced Practice Midwife

## 2016-08-31 VITALS — BP 116/66 | HR 94 | Wt 216.9 lb

## 2016-08-31 DIAGNOSIS — O24410 Gestational diabetes mellitus in pregnancy, diet controlled: Secondary | ICD-10-CM | POA: Diagnosis not present

## 2016-08-31 DIAGNOSIS — Z3480 Encounter for supervision of other normal pregnancy, unspecified trimester: Secondary | ICD-10-CM | POA: Diagnosis present

## 2016-08-31 DIAGNOSIS — B373 Candidiasis of vulva and vagina: Secondary | ICD-10-CM | POA: Insufficient documentation

## 2016-08-31 DIAGNOSIS — N898 Other specified noninflammatory disorders of vagina: Secondary | ICD-10-CM | POA: Diagnosis not present

## 2016-08-31 DIAGNOSIS — O9989 Other specified diseases and conditions complicating pregnancy, childbirth and the puerperium: Secondary | ICD-10-CM | POA: Diagnosis present

## 2016-08-31 DIAGNOSIS — Z113 Encounter for screening for infections with a predominantly sexual mode of transmission: Secondary | ICD-10-CM

## 2016-08-31 LAB — OB RESULTS CONSOLE GBS: STREP GROUP B AG: POSITIVE

## 2016-08-31 MED ORDER — DIPHENHYDRAMINE HCL 25 MG PO CAPS: 25.0000 mg | ORAL_CAPSULE | Freq: Every evening | ORAL | 0 refills | Status: DC | PRN

## 2016-08-31 MED ORDER — METFORMIN HCL 500 MG PO TABS: 500.0000 mg | ORAL_TABLET | Freq: Two times a day (BID) | ORAL | 3 refills | Status: DC

## 2016-08-31 NOTE — Progress Notes (Signed)
[employer] interpreter Ricki RodriguezAdriana [phone]. Patient states that she has been having some burning and itching x 1 week. 116/66

## 2016-08-31 NOTE — Patient Instructions (Signed)
Diagnstico de diabetes mellitus gestacional (Gestational Diabetes Mellitus, Diagnosis) La diabetes gestacional (diabetes mellitus gestacional) es una forma temporal de diabetes que algunas mujeres desarrollan durante el Media planner. Generalmente, ocurre alrededor [place] 24 a la 28de gestacin y desaparece despus del parto. Los cambios hormonales durante el embarazo pueden interferir en la produccin y la actividad de la [place], lo que puede derivar en uno de estos problemas o en ambos:  El pncreas no produce la cantidad suficiente de una hormona llamada insulina.  Las clulas del cuerpo no responden de [place] a la insulina que el organismo produce (resistencia a la insulina). Normalmente, la insulina estimula el ingreso de la glucosa en las clulas del cuerpo. Las clulas usan la glucosa para Dealer. La resistencia a la insulina o la falta de esta hormona hace que el exceso de glucosa se acumule en la sangre, en lugar de ir a las clulas. Como resultado, aumenta la glucemia (hiperglucemia). CULES SON LOS RIESGOS? Si la diabetes gestacional se trata, hay pocas probabilidades de que ocasione problemas. Si no se la controla con tratamiento, puede causar problemas durante el [place] de parto y [place], y algunos de esos problemas pueden ser dainos para el feto y la [place]. La diabetes gestacional que no se controla tambin puede producir problemas respiratorios y bajo nivel de glucemia en el recin nacido. Las mujeres que tienen diabetes gestacional son ms propensas a Lawyer afeccin si se embarazan de nuevo y a tener diabetes tipo2 en el futuro. QU INCREMENTA EL RIESGO? Es ms probable que Orthoptist en las mujeres embarazadas que:  Son [place] de 25aos durante el [place].  Tienen antecedentes familiares de diabetes.  Tienen sobrepeso.  Tuvieron diabetes gestacional en el pasado.  Tienen sndrome de ovario poliqustico (SOP).  Tienen  un embarazo gemelar o un embarazo mltiple.  Son descendientes de indgenas norteamericanos, afroamericanos, hispanos o latinos, o asiticos o isleos del [place] LOS SIGNOS O LOS SNTOMAS? La mayora de las mujeres no perciben los sntomas de la diabetes gestacional porque son similares a los sntomas normales del Media planner. Los sntomas de la diabetes gestacional pueden incluir, entre otros:  Aumento de la sed (polidipsia).  Aumento del apetito (polifagia).  Aumento de la miccin (poliuria). [place] SE DIAGNOSTICA? Esta afeccin se puede diagnosticar en funcin del nivel de glucemia que puede medirse con uno o ms de los siguientes anlisis de sangre:  Medicin de la glucemia en [place]. No se le permitir comer (tendr que [employer]) durante al menos 8horas antes de que se tome una [place] de [place].  Prueba al azar de la glucemia. Esta prueba mide la glucemia en cualquier momento del da, sin importar cundo comi.  Prueba de tolerancia a la glucosa oral (PTGO). Generalmente, se realiza [place] [employer] 24 a la 28de gestacin. ? Para esta prueba, le harn una medicin de la glucemia en ayunas. Luego, tomar una bebida que contiene glucosa. Se analizar el nivel de glucemia nuevamente 1hora despus de tomar la bebida con glucosa (PTGO a la hora). ? Si el resultado de la PTGO a la hora es igual o superior a 140mg /dl (7,13mmol/l), le repetirn la PTGO. Esta vez, se analizar el nivel de glucemia 3horas despus de tomar la bebida con glucosa (PTGO a las 3horas). Si tiene factores de [place], pueden hacerle pruebas de deteccin de la diabetes tipo2 no diagnosticada en la primera visita de atencin Medical sales representative (visita prenatal). CMO SE TRATA ESTA AFECCIN?  El tratamiento puede estar a cargo de Research officer, trade union. Para tratar esta afeccin, debe seguir las indicaciones del mdico respecto de lo siguiente:  Comer una dieta ms saludable y  aumentar la actividad fsica. Estos cambios son lo ms importante para mantener la diabetes gestacional bajo control.  Controlarse la glucemia. Hgalo con la frecuencia que le hayan indicado.  Tomar los medicamentos para la diabetes o aplicarse la [employer]. Estos frmacos se recetarn solamente si son necesarios. ? Si [place] insulina, tal vez deba ajustar las dosis en funcin de la cantidad de actividad fsica que realiza y de los alimentos que consume. El mdico le indicar cmo hacerlo. El mdico [employer] objetivos del tratamiento para usted sobre la base de la etapa del Media planner en la que se encuentra y de cualquier otra afeccin que padezca. Generalmente, el objetivo del tratamiento es [employer] siguientes niveles de glucemia durante el embarazo:  En ayunas: igual o menor que 95mg /dl (5,90mmol/l).  Despus de las comidas (posprandial): ? Una hora despus de una comida: igual o menor que 140mg /dl (7,78mmol/l). ? Dos horas despus de una comida: igual o menor que 120mg /dl (6,50mmol/l).  Nivel de A1c (hemoglobinaA1c): del 6% al 6,5%. SIGA ESTAS INDICACIONES EN SU CASA: Preguntas para hacerle al mdico Considere la posibilidad de hacer las siguientes preguntas:  Debo reunirme con Radio broadcast assistant para el cuidado de la diabetes?  Dnde puedo encontrar un grupo de apoyo para personas diabticas?  Qu equipos necesitar para controlar la diabetes en casa?  Qu medicamentos para la diabetes necesito y cundo debo tomarlos?  Con qu frecuencia debo controlarme la glucemia?  A qu nmero puedo llamar si tengo preguntas?  Cundo es mi prxima cita? Instrucciones generales  [employer] de venta libre y los recetados solamente como se lo haya indicado el mdico.  Controle el aumento de peso durante el [place]. La cantidad de [employer] se espera que aumente depende de su [place] (ndice de masa corporal) antes del embarazo.  Concurra a todas las  visitas de control como se lo haya indicado el mdico. Esto es importante.  Para obtener ms informacin sobre la diabetes, visite los siguientes sitios: ? Hotel manager de la Diabetes (American Diabetes Association, ADA): www.diabetes.org ? Asociacin Norteamericana de Instructores para el Cuidado de la Diabetes (American Association of Diabetes Educators, AADE): www.diabeteseducator.org/patient-resources COMUNQUESE CON UN MDICO SI:  El nivel de glucemia es igual o superior a 240mg /dl (13,72mmol/l).  El nivel de glucemia es igual o superior a 200mg /dl (11,42mmol/l), y tiene cetonas en la orina.  Ha estado enferma o ha tenido fiebre durante 2o ms das y no [place].  Tiene alguno de los siguientes problemas durante ms de 6horas: ? No puede comer ni beber. ? Tiene nuseas y vmitos. ? Tiene diarrea.  SOLICITE AYUDA DE INMEDIATO SI:  Su nivel de glucosa en la sangre est por debajo de 54mg /dl (20mmol/l).  Est confundida o tiene dificultad para pensar con claridad.  Tiene dificultad para respirar.  Tiene un nivel moderado o alto de cetonas en la [place].  El beb se mueve menos de lo habitual.  Tiene secreciones inusuales o sangrado de la vagina.  Comienza a tener contracciones antes de tiempo (prematuramente). Las contracciones se pueden sentir como un endurecimiento de la parte inferior del abdomen.  Esta informacin no tiene Marine scientist el consejo del mdico. Asegrese de hacerle al mdico cualquier pregunta que tenga. Document Released: 11/10/2004 Document Revised: 05/25/2015 Document Reviewed: 03/06/2015 Elsevier Interactive  Patient Education  2017 Elsevier Inc.  

## 2016-08-31 NOTE — Progress Notes (Signed)
   PRENATAL VISIT NOTE  Subjective:  Kristy Nguyen is a 26 y.o. G2P0010 at 3653w1d being seen today for ongoing prenatal care.  She is currently monitored for the following issues for this high-risk pregnancy and has Supervision of low-risk pregnancy; Urinary tract infection affecting care of mother in third trimester, antepartum; Rash; and DM (diabetes mellitus), gestational on her problem list.  Patient reports backache, fatigue, occasional contractions and vaginal irritation.  Contractions: Irritability. Vag. Bleeding: None.  Movement: Present. Denies leaking of fluid.   Needs letter for checking sugar at work Not sleeping well at night, has pain 4 UCs per day Has not gotten pregnancy support belt Vaginal discharge with itching    The following portions of the patient's history were reviewed and updated as appropriate: allergies, current medications, past family history, past medical history, past social history, past surgical history and problem list. Problem list updated.  Objective:   Vitals:   08/31/16 0853  BP: 116/66  Pulse: 94  Weight: 216 lb 14.4 oz (98.4 kg)    Fetal Status: Fetal Heart Rate (bpm): 138   Movement: Present     General:  Alert, oriented and cooperative. Patient is in no acute distress.  Skin: Skin is warm and dry. No rash noted.   Cardiovascular: Normal heart rate noted  Respiratory: Normal respiratory effort, no problems with respiration noted  Abdomen: Soft, gravid, appropriate for gestational age.  Pain/Pressure: Present     Pelvic: Cervical exam performed        Dilation: Fingertip Effacement (%): 80 Station: -1 Presentation: Vertex   Extremities: Normal range of motion.  Edema: Trace  Mental Status:  Normal mood and affect. Normal behavior. Normal judgment and thought content.   Assessment and Plan:  Pregnancy: G2P0010 at 7753w1d  1. Supervision of other normal pregnancy, antepartum      Tested for yeast and BV also - Culture, beta strep  (group b only) - Cervicovaginal ancillary only  2. Vaginal irritation      Suspect yeast       Will treat presumptively and send test - Cervicovaginal ancillary only  3.   Gestational Diabetes       FBS:  90-104, most in mid-90s       2 hr B:  8/12 elevated over 120       2 hr L:  10/12  Elevated       2 hr D:  7/12 elevated      Consulted Dr Alysia PennaErvin re:  Metformin vs Glyburide      Rx metformin   Preterm labor symptoms and general obstetric precautions including but not limited to vaginal bleeding, contractions, leaking of fluid and fetal movement were reviewed in detail with the patient. Please refer to After Visit Summary for other counseling recommendations.  Return in about 1 week (around 09/07/2016) for [facility].   Wynelle BourgeoisMarie Blayre Papania, CNM

## 2016-09-01 ENCOUNTER — Other Ambulatory Visit (HOSPITAL_COMMUNITY): Payer: Self-pay | Admitting: Advanced Practice Midwife

## 2016-09-01 LAB — CERVICOVAGINAL ANCILLARY ONLY
BACTERIAL VAGINITIS: NEGATIVE
CANDIDA VAGINITIS: POSITIVE — AB
Chlamydia: NEGATIVE
Neisseria Gonorrhea: NEGATIVE
Trichomonas: NEGATIVE

## 2016-09-01 MED ORDER — TERCONAZOLE 0.4 % VA CREA: 1.0000 | TOPICAL_CREAM | Freq: Every day | VAGINAL | 0 refills | Status: DC

## 2016-09-01 NOTE — Progress Notes (Signed)
Rx for yeast vaginitis, Terazol 7

## 2016-09-02 ENCOUNTER — Other Ambulatory Visit: Payer: Self-pay | Admitting: Obstetrics and Gynecology

## 2016-09-03 LAB — CULTURE, BETA STREP (GROUP B ONLY): STREP GP B CULTURE: POSITIVE — AB

## 2016-09-06 ENCOUNTER — Encounter: Payer: Self-pay | Admitting: Pediatric Intensive Care

## 2016-09-07 NOTE — Congregational Nurse Program (Signed)
Congregational Nurse Program Note  Date of Encounter: 08/11/2016  Past Medical History: Past Medical History:  Diagnosis Date  . Allergy   . Medical history non-contributory     Encounter Details:     CNP Questionnaire - 08/11/16 1800      Patient Demographics   Is this a new or existing patient? New   Patient is considered a/an Immigrant   Race Latino/Hispanic     Patient Assistance   Location of Patient Assistance [employer]   Patient's financial/insurance status Self-Pay (Uninsured)   Uninsured Patient ([employer] Research officer, trade unionCard/Care Connects) Yes   Interventions Counseled to make appt. with provider   Patient referred to apply for the following financial assistance Not Applicable   Food insecurities addressed Not Applicable   Transportation assistance No   Assistance securing medications No   Educational health offerings Acute disease     Encounter Details   Primary purpose of visit Acute Illness/Condition Visit   Was an Emergency Department visit averted? Not Applicable   Does patient have a medical provider? Yes   Patient referred to Follow up with established PCP   Was a mental health screening completed? (GAINS tool) No   Does patient have dental issues? No   Does patient have vision issues? No   Does your patient have an abnormal blood pressure today? No   Since previous encounter, have you referred patient for abnormal blood pressure that resulted in a new diagnosis or medication change? No   Does your patient have an abnormal blood glucose today? No   Since previous encounter, have you referred patient for abnormal blood glucose that resulted in a new diagnosis or medication change? No   Was there a life-saving intervention made? No         Clinical Intake - 08/16/16 1619      Language Assistant   Interpreter Needed? Yes   Interpreter Agency [employer]   Interpreter Name Durwin GlazeLaura   Interpreter ID [id number]     Follow up with client to discuss OTC medication options for  URI symptoms. CN advises calling OB office for recommendations.
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
## 2016-09-11 ENCOUNTER — Encounter (HOSPITAL_COMMUNITY): Payer: Self-pay | Admitting: Advanced Practice Midwife

## 2016-09-11 DIAGNOSIS — O98819 Other maternal infectious and parasitic diseases complicating pregnancy, unspecified trimester: Secondary | ICD-10-CM

## 2016-09-11 DIAGNOSIS — Z8619 Personal history of other infectious and parasitic diseases: Secondary | ICD-10-CM | POA: Insufficient documentation

## 2016-09-11 DIAGNOSIS — B951 Streptococcus, group B, as the cause of diseases classified elsewhere: Secondary | ICD-10-CM | POA: Insufficient documentation

## 2016-09-14 ENCOUNTER — Inpatient Hospital Stay (HOSPITAL_COMMUNITY)
Admission: AD | Admit: 2016-09-14 | Discharge: 2016-09-17 | DRG: 775 | Disposition: A | Discharge status: Home / Self Care | Payer: Medicaid Other | Source: Ambulatory Visit | Attending: Obstetrics & Gynecology | Admitting: Obstetrics & Gynecology

## 2016-09-14 ENCOUNTER — Ambulatory Visit: Payer: Self-pay

## 2016-09-14 ENCOUNTER — Ambulatory Visit (INDEPENDENT_AMBULATORY_CARE_PROVIDER_SITE_OTHER): Payer: Medicaid Other | Admitting: Obstetrics & Gynecology

## 2016-09-14 ENCOUNTER — Encounter (HOSPITAL_COMMUNITY): Payer: Self-pay | Admitting: *Deleted

## 2016-09-14 VITALS — BP 124/78 | HR 87 | Wt 226.0 lb

## 2016-09-14 DIAGNOSIS — Z6841 Body Mass Index (BMI) 40.0 and over, adult: Secondary | ICD-10-CM | POA: Diagnosis not present

## 2016-09-14 DIAGNOSIS — Z3A37 37 weeks gestation of pregnancy: Secondary | ICD-10-CM

## 2016-09-14 DIAGNOSIS — Z880 Allergy status to penicillin: Secondary | ICD-10-CM | POA: Diagnosis not present

## 2016-09-14 DIAGNOSIS — O99824 Streptococcus B carrier state complicating childbirth: Secondary | ICD-10-CM | POA: Diagnosis present

## 2016-09-14 DIAGNOSIS — O368130 Decreased fetal movements, third trimester, not applicable or unspecified: Secondary | ICD-10-CM

## 2016-09-14 DIAGNOSIS — O24425 Gestational diabetes mellitus in childbirth, controlled by oral hypoglycemic drugs: Secondary | ICD-10-CM | POA: Diagnosis present

## 2016-09-14 DIAGNOSIS — O99214 Obesity complicating childbirth: Secondary | ICD-10-CM | POA: Diagnosis present

## 2016-09-14 DIAGNOSIS — O24415 Gestational diabetes mellitus in pregnancy, controlled by oral hypoglycemic drugs: Secondary | ICD-10-CM | POA: Diagnosis not present

## 2016-09-14 DIAGNOSIS — O98813 Other maternal infectious and parasitic diseases complicating pregnancy, third trimester: Secondary | ICD-10-CM

## 2016-09-14 DIAGNOSIS — O2412 Pre-existing diabetes mellitus, type 2, in childbirth: Secondary | ICD-10-CM | POA: Diagnosis not present

## 2016-09-14 DIAGNOSIS — O4103X0 Oligohydramnios, third trimester, not applicable or unspecified: Secondary | ICD-10-CM | POA: Diagnosis present

## 2016-09-14 DIAGNOSIS — Z87891 Personal history of nicotine dependence: Secondary | ICD-10-CM

## 2016-09-14 DIAGNOSIS — Z833 Family history of diabetes mellitus: Secondary | ICD-10-CM | POA: Diagnosis not present

## 2016-09-14 DIAGNOSIS — B951 Streptococcus, group B, as the cause of diseases classified elsewhere: Secondary | ICD-10-CM

## 2016-09-14 DIAGNOSIS — O09299 Supervision of pregnancy with other poor reproductive or obstetric history, unspecified trimester: Secondary | ICD-10-CM | POA: Diagnosis present

## 2016-09-14 DIAGNOSIS — O98819 Other maternal infectious and parasitic diseases complicating pregnancy, unspecified trimester: Secondary | ICD-10-CM

## 2016-09-14 DIAGNOSIS — O4100X0 Oligohydramnios, unspecified trimester, not applicable or unspecified: Secondary | ICD-10-CM | POA: Diagnosis present

## 2016-09-14 DIAGNOSIS — O0993 Supervision of high risk pregnancy, unspecified, third trimester: Secondary | ICD-10-CM

## 2016-09-14 HISTORY — DX: Type 2 diabetes mellitus without complications: E11.9

## 2016-09-14 LAB — CBC
HEMATOCRIT: 36.6 % (ref 36.0–46.0)
Hemoglobin: 11.8 g/dL — ABNORMAL LOW (ref 12.0–15.0)
MCH: 28.9 pg (ref 26.0–34.0)
MCHC: 32.2 g/dL (ref 30.0–36.0)
MCV: 89.7 fL (ref 78.0–100.0)
Platelets: 318 10*3/uL (ref 150–400)
RBC: 4.08 MIL/uL (ref 3.87–5.11)
RDW: 14.2 % (ref 11.5–15.5)
WBC: 12.9 10*3/uL — AB (ref 4.0–10.5)

## 2016-09-14 LAB — TYPE AND SCREEN
ABO/RH(D): O POS
Antibody Screen: NEGATIVE

## 2016-09-14 MED ORDER — OXYTOCIN BOLUS FROM INFUSION: 500.0000 mL | Freq: Once | INTRAVENOUS | Status: DC

## 2016-09-14 MED ORDER — TERBUTALINE SULFATE 1 MG/ML IJ SOLN
0.2500 mg | Freq: Once | INTRAMUSCULAR | Status: AC | PRN
  Administered 2016-09-15: 0.25 mg via SUBCUTANEOUS
  Filled 2016-09-14: qty 1

## 2016-09-14 MED ORDER — ACETAMINOPHEN 325 MG PO TABS: 650.0000 mg | ORAL_TABLET | ORAL | Status: DC | PRN

## 2016-09-14 MED ORDER — METFORMIN HCL 500 MG PO TABS: 1000.0000 mg | ORAL_TABLET | Freq: Two times a day (BID) | ORAL | 3 refills | Status: DC

## 2016-09-14 MED ORDER — CEFAZOLIN SODIUM-DEXTROSE 1-4 GM/50ML-% IV SOLN
1.0000 g | Freq: Three times a day (TID) | INTRAVENOUS | Status: DC
  Administered 2016-09-15: 1 g via INTRAVENOUS
  Filled 2016-09-14 (×3): qty 50

## 2016-09-14 MED ORDER — CEFAZOLIN SODIUM-DEXTROSE 2-4 GM/100ML-% IV SOLN
2.0000 g | Freq: Once | INTRAVENOUS | Status: AC
  Administered 2016-09-15: 2 g via INTRAVENOUS

## 2016-09-14 MED ORDER — FLEET ENEMA 7-19 GM/118ML RE ENEM: 1.0000 | ENEMA | RECTAL | Status: DC | PRN

## 2016-09-14 MED ORDER — LIDOCAINE HCL (PF) 1 % IJ SOLN
30.0000 mL | INTRAMUSCULAR | Status: DC | PRN
  Filled 2016-09-14: qty 30

## 2016-09-14 MED ORDER — FENTANYL CITRATE (PF) 100 MCG/2ML IJ SOLN
100.0000 ug | INTRAMUSCULAR | Status: DC | PRN
  Administered 2016-09-14 – 2016-09-15 (×2): 100 ug via INTRAVENOUS
  Filled 2016-09-14 (×2): qty 2

## 2016-09-14 MED ORDER — OXYCODONE-ACETAMINOPHEN 5-325 MG PO TABS: 2.0000 | ORAL_TABLET | ORAL | Status: DC | PRN

## 2016-09-14 MED ORDER — SOD CITRATE-CITRIC ACID 500-334 MG/5ML PO SOLN: 30.0000 mL | ORAL | Status: DC | PRN

## 2016-09-14 MED ORDER — LACTATED RINGERS IV SOLN
INTRAVENOUS | Status: DC
  Administered 2016-09-14: 20:00:00 via INTRAVENOUS

## 2016-09-14 MED ORDER — ZOLPIDEM TARTRATE 5 MG PO TABS
5.0000 mg | ORAL_TABLET | Freq: Every evening | ORAL | Status: DC | PRN
  Administered 2016-09-14: 5 mg via ORAL
  Filled 2016-09-14: qty 1

## 2016-09-14 MED ORDER — ONDANSETRON HCL 4 MG/2ML IJ SOLN: 4.0000 mg | Freq: Four times a day (QID) | INTRAMUSCULAR | Status: DC | PRN

## 2016-09-14 MED ORDER — LACTATED RINGERS IV SOLN
500.0000 mL | INTRAVENOUS | Status: DC | PRN
  Administered 2016-09-15: 500 mL via INTRAVENOUS

## 2016-09-14 MED ORDER — OXYTOCIN 40 UNITS IN LACTATED RINGERS INFUSION - SIMPLE MED
2.5000 [IU]/h | INTRAVENOUS | Status: DC
  Filled 2016-09-14: qty 1000

## 2016-09-14 MED ORDER — METFORMIN HCL 500 MG PO TABS
1000.0000 mg | ORAL_TABLET | Freq: Two times a day (BID) | ORAL | Status: DC
  Filled 2016-09-14 (×2): qty 2

## 2016-09-14 MED ORDER — MISOPROSTOL 25 MCG QUARTER TABLET
25.0000 ug | ORAL_TABLET | ORAL | Status: DC | PRN
  Administered 2016-09-14: 25 ug via VAGINAL
  Filled 2016-09-14 (×2): qty 1

## 2016-09-14 MED ORDER — OXYCODONE-ACETAMINOPHEN 5-325 MG PO TABS: 1.0000 | ORAL_TABLET | ORAL | Status: DC | PRN

## 2016-09-14 NOTE — Progress Notes (Signed)
   PRENATAL VISIT NOTE  Subjective:  Kristy Nguyen is a 26 y.o. G2P0010 at 3775w1d being seen today for ongoing prenatal care. Patient is Spanish-speaking only, Spanish interpreter present for this encounter.  Accompanied by her husband. She is currently monitored for the following issues for this high-risk pregnancy and has Supervision of high-risk pregnancy; DM (diabetes mellitus), gestational; and Group B streptococcal infection during pregnancy on her problem list.  Patient reports decreased fetal movement and irregular contractions.  Contractions: Irritability.  .  Movement: Present. Denies leaking of fluid.   The following portions of the patient's history were reviewed and updated as appropriate: allergies, current medications, past family history, past medical history, past social history, past surgical history and problem list. Problem list updated.  Objective:   Vitals:   09/14/16 1251  BP: 124/78  Pulse: 87  Weight: 226 lb (102.5 kg)    Fetal Status: Fetal Heart Rate (bpm): 154 Fundal Height: 39 cm Movement: Present  Presentation: Vertex  General:  Alert, oriented and cooperative. Patient is in no acute distress.  Skin: Skin is warm and dry. No rash noted.   Cardiovascular: Normal heart rate noted  Respiratory: Normal respiratory effort, no problems with respiration noted  Abdomen: Soft, gravid, appropriate for gestational age.  Pain/Pressure: Present     Pelvic: Cervical exam deferred        Extremities: Normal range of motion.  Edema: Trace  Mental Status:  Normal mood and affect. Normal behavior. Normal judgment and thought content.      BPP done in clinic:  AFI 1.56 cm, BPP 6/10 (-breathing, oligohydramnios).  Reactive NST.  Assessment and Plan:  Pregnancy: G2P0010 at 3675w1d   1. Decreased fetal movements in third trimester, single or unspecified fetus 2. Gestational diabetes mellitus (GDM) in third trimester controlled on oral hypoglycemic drug 3. Supervision of  high risk pregnancy in third trimester Given likely ROM, and oligohydramnios at term, BPP 6/10, will proceed with IOL today.  L&D team notified.  4. Group B streptococcal infection during pregnancy Treat in labor.  Sensitivities not done. Low severity of PCN allergy, may try  Ancef vs Ceftriaxone. Patient informed.  Please refer to After Visit Summary for other counseling recommendations.  Return in 4 weeks (on 10/12/2016) for Postpartum check.   Jaynie CollinsUgonna Hazael Olveda, MD

## 2016-09-14 NOTE — Patient Instructions (Signed)
Diabetes mellitus gestacional, cuidados personales (Gestational Diabetes Mellitus, Self Care) El cuidado personal despus del diagnstico de diabetes gestacional (diabetes mellitus gestacional) implica mantener el nivel de glucosa en la sangre bajo control a travs del equilibrio de los siguientes factores:  Nutricin.  Actividad fsica.  Cambios en el estilo de vida.  Medicamentos o insulina, si es necesario.  El apoyo del equipo de mdicos y de otras personas. La siguiente informacin explica lo que debe saber para mantener la diabetes gestacional bajo control en su casa. QU DEBO SABER PARA MANTENER LA GLUCEMIA BAJO CONTROL?  Contrlese la glucemia todos los das durante el embarazo. Haga esto con la frecuencia que le haya indicado el mdico.  Comunquese con el mdico si la glucemia est por encima del nivel ideal en 2anlisis seguidos. El mdico establecer los objetivos personalizados de su tratamiento. Generalmente, el objetivo del tratamiento es mantener los siguientes niveles de glucemia durante el embarazo:  Despus de no haber comido durante 8horas (despus de ayunar): igual o menor que 95mg/dl (5,3mmol/l).  Despus de las comidas (posprandial):  Una hora despus de una comida: igual o menor que 140mg/dl (7,8mmol/l).  Dos horas despus de una comida: igual o menor que 120mg/dl (6,7mmol/l).  Nivel de A1c (hemoglobinaA1c): del 6% al 6,5%. QU DEBO SABER SOBRE LA HIPERGLUCEMIA Y LA HIPOGLUCEMIA? Qu es la hiperglucemia?  La hiperglucemia, tambin llamada glucemia alta, ocurre cuando el nivel de glucosa en la sangre es muy elevado. Asegrese de conocer los signos tempranos de hiperglucemia, por ejemplo:  Aumento de la sed.  Hambre.  Mucho cansancio.  Necesidad de orinar con mayor frecuencia que lo habitual.  Visin borrosa. Qu es la hipoglucemia?  La hipoglucemia, tambin llamada glucemia baja, ocurre cuando el nivel de glucosa en la sangre es igual  o menor que 70mg/dl (3,9mmol/l). El riesgo de hipoglucemia aumenta durante o despus de realizar actividad fsica, mientras duerme, cuando est enfermo o si se saltea comidas o no come durante mucho tiempo (ayuna). Es importante conocer los sntomas de la hipoglucemia y tratarla de inmediato. Lleve siempre consigo una colacin de 15gramos hidratos de carbono de accin rpida para tratar la glucemia baja.Los familiares y los amigos cercanos tambin deben conocer los sntomas, y comprender cmo tratar la hipoglucemia, en caso de que usted no pueda tratarse a s mismo. Cules son los sntomas de la hipoglucemia?  Los sntomas de hipoglucemia pueden incluir los siguientes:  Hambre.  Ansiedad.  Sudoracin y piel hmeda.  Confusin.  Mareos o sensacin de desvanecimiento.  Somnolencia.  Nuseas.  Aumento de la frecuencia cardaca.  Dolor de cabeza.  Visin borrosa.  Convulsiones.  Pesadillas.  Hormigueo o adormecimiento alrededor de la boca, los labios o la lengua.  Cambios en el habla.  Disminucin de la capacidad de concentracin.  Cambios en la coordinacin.  Sueo agitado.  Temblores o sacudidas.  Desmayos.  Irritabilidad. Cmo se trata la hipoglucemia?  Si est alerta y puede tragar con seguridad, siga la regla de 15/15, que consiste en lo siguiente:  Tome 15gramos de hidratos de carbono de accin rpida. Las opciones de accin rpida incluyen lo siguiente:  1pomo de glucosa en gel.  3comprimidos de glucosa.  6 a 8unidades de caramelos duros.  4onzas (120ml) de jugo de frutas.  4onzas (120ml) de gaseosa comn (no diettica).  Contrlese la glucemia 15minutos despus de ingerir el hidrato de carbono.  Si este nuevo nivel de glucosa en la sangre an es de 70mg/dl o menor (3,9mmol/l), vuelva a ingerir 15gramos de   Si la glucemia no aumenta por encima de 61m/dl (3,920ml/l) despus de 3intentos, solicite ayuda mdica de  emergencia.  Ingiera una comida o una colacin en el transcurso de 1hora despus de que la glucemia se haya normalizado. Cmo se trata la hipoglucemia grave? La hipoglucemia grave ocurre cuando la glucemia es igual o menor que 549ml (3mm72ml). La hipoglucemia grave es una Engineer, maintenance (IT) espere hasta que los sntomas desaparezcan. Solicite atencin mdica de inmediato. Comunquese con el servicio de emergencias de su localidad (911 en los [place]). No conduzca por sus propios medios hastPrincipal Financial tiene hipoglucemia grave y no puede ingerir alimentos o bebidas, tal vez deba aplicarse una inyeccin de glucagn. Un familiar o un amigo cercano deben aprender a controlarle la glucemia y a aplicarle una inyeccin de glucagn. Pregntele al mdico si debe tener disponible un kit de inyecciones de glucagn de emerFreight forwarder posible que la hipoglucemia grave deba tratarse en un hospital. El tratamiento puede incluir la administracin de glucosa a travs de una va intravenosa (IV). Tambin puede necesitar un tratamiento para tratar la afeccin que est causando la hipoglucemia. QU OTRAS COSAS PUEDO HACER PARA CONTROLAR LA DIABETES GESTACIONAL? Tome los medicamentos para la diabetes como se lo hayan indicado  Si el mdico le recet insulina o medicamentos para la diabetes, tmelos todos los [place] se quede sin insulina ni cualquier otro medicamento para la diabetes que tome. Planifique con antelacin para tenerlos siempre a su disposicin.  Si usa [place]ulina, ajuste las dosis en funcin de la cantidad de actividad fsica que realiza y de los alimentos que consume. El mdico le indicar cmo ajustar las dosis. Opte por opciones de alimentos saludables Lo que come y bebe incide en la glucemia. Hacer buenas elecciones ayuda a mantener la diabetes bajo control y a evitTax inspectorsalud. Un plan de alimentacin saludable incluye consumir protenas magras, hidratos de carbono  complejos, frutas y verduras frescas, productos lcteos con bajo contenido de grasDjiboutirasas saludables. Programe una cita con un especialista en alimentacin y nutricin (nutricionista certificado) para que la ayude a armar un plan de alimentacin adecuado para usted. Asegrese de lo siguiente:  Siga las indicaciones del mdico respecto de las restricciones para las comidas o las bebidas.  Beba suficiente lquido para mantConsulting civil engineerna clara o de color amarillo plido.  Ingiera colaciones saludables entre comidas nutritivas.  Haga un seguimiento de los hidratos de carbono que consume. Para hacerlo, lea las etiquetas de informacin nutricional y aprenda cules son los tamaos de las porciones estndar de los alimentos.  Siga el plan para los das de enfermedad cuando no pueda comer o beber normalmente. Arme este plan por adelantado con el mdicWaynesboromenos 30mi34ms de actividad fsica por da, o tanta actividad fsica como le recomiende el mdico durante el embarSharon Springsacer 10min3m de actividad fsica 30minu80mdespus de cada comida puede ayudar a controlPilgrim's Pridecemia posprandial.  Si comienza un ejercicio o una actividad nuevos, trabaje con el mdico para ajustar la insulina, los medicamentos o la ingesta de comidas segn sea necesario. Opte por un estilo de vida saludable  No beba alcohol.  No consuma ningn producto que contenga tabaco, lo que incluye cigarrillos, tabaco de mascar Higher education careers adviserrriPsychologist, sport and exercisecesita ayuda para dejar de fumar, consulte al mdico.  Aprenda a manejarEngineer, maintenance (IT)cesita ayuda para lograrlo, consulte a su mdico. Cuide su cuerpo  Mantngase al da con  las vacunas.  Cepllese los dientes y [place] y use hilo dental al menos una vez por da.  Visite al dentista al menos una vez cada 73mses.  Mantenga un peso sTax adviser Instrucciones generales  TAnheuser-Buschde venta libre y los recetados solamente como se lo haya indicado el mdico.  Hable con el mdico sobre el riesgo de tener hipertensin arterial durante el embarazo (preeclampsia o eclampsia).  Comparta su plan de control de la diabetes con sus compaeros de trabajo y de lCytogeneticist y con las personas con las que cDorrance  Controle el nivel de cetonas en la orina durante el embarazo cuando est enferma o como se lo haya indicado el mdico.  Lleve una tarjeta de alerta mdica o use un brazalete o medalla de alerta mdica.  Pregntele al mdico: ? Debo reunirme con uRadio broadcast assistantpara el cuidado de la diabetes? ? Dnde puedo encontrar un grupo de apoyo para personas diabticas?  Asista a todas las visitas de control durante el embarazo (prenatal) y despus del parto (posnatal) como se lo haya indicado el mdico. Esto es importante. Procure recibir la atencin que necesita despus del parto  Hgase controlar la glucemia de 4a 12semanas despus del parto. Esto se hace con una prueba de tolerancia a la glucosa oral (PTGO).  Hgase controles de deteccin de la diabetes al mWalgreencada 3aos o con la frecuencia que le haya indicado el mdico. DNDE EFortune BrandsMS INFORMACIN: Para obtener ms informacin sobre la diabetes gestacional, visite los siguientes sitios:  Asociacin Americana de la Diabetes (American Diabetes Association, ADA): www.diabetes.org/diabetes-basics/gestational  Centros para eBuilding surveyory la Prevencin de EProbation officerfor Disease Control and Prevention, CDC): whttp://sanchez-watson.com/pdf Esta informacin no tiene cMarine scientistel consejo del mdico. Asegrese de hacerle al mdico cualquier pregunta que tenga. Document Released: 05/25/2015 Document Revised: 05/25/2015 Document Reviewed: 03/06/2015 Elsevier Interactive Patient Education  2017 EReynolds [employer]

## 2016-09-14 NOTE — H&P (Signed)
LABOR ADMISSION HISTORY AND PHYSICAL  Kristy Nguyen is a 26 y.o. female G2P0010 with IUP at 47w1dby LMP presenting for IOL for oligohydramnios with A2GDM on metformin for 1 week. She reports +FMs, No LOF, no VB, no blurry vision, no headaches, no peripheral edema, and no RUQ pain.  She plans on breast and bottle feeding. She requests nexplanon for birth control.  Dating: By LMP --->  Estimated Date of Delivery: 10/04/16  Sono:   '@[redacted]w[redacted]d'$ , CWD, normal anatomy, cephalic presentation, 3846K 55% EFW  Prenatal History/Complications:  Past Medical History: Past Medical History:  Diagnosis Date  . Allergy   . Diabetes mellitus without complication ([place]    gestational  . Medical history non-contributory     Past Surgical History: Past Surgical History:  Procedure Laterality Date  . NO PAST SURGERIES      Obstetrical History: OB History    Gravida Para Term Preterm AB Living   2 0 0 0 1 0   SAB TAB Ectopic Multiple Live Births   1 0 0 0 0      Social History: Social History   Social History  . Marital status: Single    Spouse name: N/A  . Number of children: N/A  . Years of education: N/A   Social History Main Topics  . Smoking status: Former SResearch scientist (life sciences) . Smokeless tobacco: Former USystems developer . Alcohol use No  . Drug use: No  . Sexual activity: Yes    Birth control/ protection: None   Other Topics Concern  . None   Social History Narrative  . None    Family History: Family History  Problem Relation Age of Onset  . Diabetes Maternal Grandmother   . Cancer Paternal Uncle     Allergies: Allergies  Allergen Reactions  . Fish Allergy Rash  . Penicillins Rash and Other (See Comments)    Has patient had a PCN reaction causing immediate rash, facial/tongue/throat swelling, SOB or lightheadedness with hypotension: No Has patient had a PCN reaction causing severe rash involving mucus membranes or skin necrosis: No Has patient had a PCN reaction that required  hospitalization No Has patient had a PCN reaction occurring within the last 10 years: No If all of the above answers are "NO", then may proceed with Cephalosporin use.    Prescriptions Prior to Admission  Medication Sig Dispense Refill Last Dose  . Prenatal Multivit-Min-Fe-FA (PRENATAL VITAMINS) 0.8 MG tablet Take 1 tablet by mouth daily. 30 tablet 12 09/13/2016 at Unknown time  . ACCU-CHEK FASTCLIX LANCETS MISC 1 each by Percutaneous route 4 (four) times daily. 100 each 12 Taking  . Blood Glucose Monitoring Suppl (ACCU-CHEK AVIVA PLUS) w/Device KIT USE AS DIRECTED 1 kit 0 Taking  . diphenhydrAMINE (BENADRYL) 25 mg capsule Take 1 capsule (25 mg total) by mouth at bedtime as needed. 30 capsule 0 Taking  . Elastic Bandages & Supports (COMFORT FIT MATERNITY SUPP MED) MISC Wear daily when ambulating 1 each 0 Taking  . glucose blood (ACCU-CHEK AVIVA PLUS) test strip 1 each by Other route 4 (four) times daily. Use as instructed 50 each 12 Taking  . metFORMIN (GLUCOPHAGE) 500 MG tablet Take 2 tablets (1,000 mg total) by mouth every 12 (twelve) hours. 60 tablet 3   . nitrofurantoin, macrocrystal-monohydrate, (MACROBID) 100 MG capsule Take 1 capsule (100 mg total) by mouth 2 (two) times daily. (Patient not taking: Reported on 07/21/2016) 14 capsule 0 Not Taking  . terconazole (TERAZOL 7) 0.4 % vaginal cream Place 1  applicator vaginally at bedtime. (Patient not taking: Reported on 09/14/2016) 45 g 0 Not Taking     Review of Systems   All systems reviewed and negative except as stated in HPI  Blood pressure 129/74, pulse (!) 112, temperature 98.6 F (37 C), temperature source Oral, resp. rate 18, height '5\' 1"'$  (1.549 m), weight 102.5 kg (226 lb), last menstrual period 12/29/2015. General appearance: alert, cooperative and no distress Lungs: normal work of breathing Extremities: Homans sign is negative, no sign of DVT Presentation: cephalic Fetal monitoringBaseline: 150 bpm, Variability: Good {> 6 bpm),  Accelerations: Reactive and Decelerations: Absent Uterine activity irregular Dilation: 1 Effacement (%): 60, 70 Station: -1 Exam by:: Tobie Poet, RN   Prenatal labs: ABO, Rh: O/Positive/-- (02/20 1613) Antibody: Negative (02/20 1613) Rubella: Immune RPR: Non Reactive (06/28 0859)  HBsAg: Negative (02/20 1613)  HIV:   Negative GBS:   Positive- Ancef GTT: Fasting- 95, 1 hr- 192, 2 hr- 150  Prenatal Transfer Tool  Maternal Diabetes: Yes:  Diabetes Type:  Insulin/Medication controlled Genetic Screening: Normal Maternal Ultrasounds/Referrals: Abnormal:  Findings:   Other: oligohydramnios Fetal Ultrasounds or other Referrals:  None Maternal Substance Abuse:  No Significant Maternal Medications:  Meds include: Other: Metformin Significant Maternal Lab Results: Lab values include: Group B Strep positive  No results found for this or any previous visit (from the past 24 hour(s)).  Patient Active Problem List   Diagnosis Date Noted  . Oligohydramnios 09/14/2016  . Group B streptococcal infection during pregnancy 09/11/2016  . DM (diabetes mellitus), gestational 08/05/2016  . Supervision of high-risk pregnancy 04/05/2016    Assessment: Kristy Nguyen is a 26 y.o. G2P0010 at 17w1dhere for IOL for oligohydramnios with A2GDM.  #Labor: Induction protocol- placed foley bulb and cytotec 25 mcg vaginally #Pain: Epidural upon request  #FWB: Cat 1 #ID: GBS positive- treat with Ancef when in active labor #MOF: breast and bottle #MOC:nexplanon #Circ: does not want #A2GDM: continue home metformin while in latent labor with low carb diet, cbg q4  JMartiniqueShirley, DO Family Medicine Resident PGY-1  09/14/2016, 8:40 PM  CNM attestation:  I have seen and examined this patient; I agree with above documentation in the resident's note.   Kristy Nguyen a 26y.o. G2P0010 here for IOL due to oligo, also with GDMA2  PE: BP 108/84   Pulse 70   Temp 98.8 F (37.1 C) (Oral)   Resp 16   Ht  '5\' 1"'$  (1.549 m)   Wt 102.5 kg (226 lb)   LMP 12/29/2015   BMI 42.70 kg/m  Gen: calm comfortable, NAD Resp: normal effort, no distress Abd: gravid  ROS, labs, PMH reviewed  Plan: Admit to BUnumProvidentcx ripening with cervical foley/cytotec, then Pit/AROM prn Ancef for GBS ppx when active/ROM Anticipate SVD  SHAW, KIMBERLY CNM 09/15/2016, 2:40 AM

## 2016-09-15 ENCOUNTER — Inpatient Hospital Stay (HOSPITAL_COMMUNITY): Payer: Medicaid Other | Admitting: Anesthesiology

## 2016-09-15 ENCOUNTER — Encounter (HOSPITAL_COMMUNITY): Payer: Self-pay | Admitting: *Deleted

## 2016-09-15 DIAGNOSIS — O2412 Pre-existing diabetes mellitus, type 2, in childbirth: Secondary | ICD-10-CM

## 2016-09-15 DIAGNOSIS — Z3A37 37 weeks gestation of pregnancy: Secondary | ICD-10-CM

## 2016-09-15 DIAGNOSIS — O4103X0 Oligohydramnios, third trimester, not applicable or unspecified: Secondary | ICD-10-CM

## 2016-09-15 LAB — GLUCOSE, CAPILLARY
GLUCOSE-CAPILLARY: 96 mg/dL (ref 65–99)
GLUCOSE-CAPILLARY: 111 mg/dL — AB (ref 65–99)
GLUCOSE-CAPILLARY: 121 mg/dL — AB (ref 65–99)
GLUCOSE-CAPILLARY: 129 mg/dL — AB (ref 65–99)
GLUCOSE-CAPILLARY: 120 mg/dL — AB (ref 65–99)
Glucose-Capillary: 87 mg/dL (ref 65–99)
Glucose-Capillary: 156 mg/dL — ABNORMAL HIGH (ref 65–99)
Glucose-Capillary: 120 mg/dL — ABNORMAL HIGH (ref 65–99)
Glucose-Capillary: 107 mg/dL — ABNORMAL HIGH (ref 65–99)

## 2016-09-15 LAB — RPR: RPR: NONREACTIVE

## 2016-09-15 MED ORDER — IBUPROFEN 600 MG PO TABS
600.0000 mg | ORAL_TABLET | Freq: Four times a day (QID) | ORAL | Status: DC
  Administered 2016-09-15 – 2016-09-17 (×6): 600 mg via ORAL
  Filled 2016-09-15 (×6): qty 1

## 2016-09-15 MED ORDER — TETANUS-DIPHTH-ACELL PERTUSSIS 5-2.5-18.5 LF-MCG/0.5 IM SUSP: 0.5000 mL | Freq: Once | INTRAMUSCULAR | Status: DC

## 2016-09-15 MED ORDER — SODIUM CHLORIDE 0.9 % IV SOLN
INTRAVENOUS | Status: DC
  Administered 2016-09-15: 0.6 [IU]/h via INTRAVENOUS
  Filled 2016-09-15: qty 1

## 2016-09-15 MED ORDER — OXYTOCIN 40 UNITS IN LACTATED RINGERS INFUSION - SIMPLE MED
1.0000 m[IU]/min | INTRAVENOUS | Status: DC
  Administered 2016-09-15: 2 m[IU]/min via INTRAVENOUS

## 2016-09-15 MED ORDER — PHENYLEPHRINE 40 MCG/ML (10ML) SYRINGE FOR IV PUSH (FOR BLOOD PRESSURE SUPPORT)
80.0000 ug | PREFILLED_SYRINGE | INTRAVENOUS | Status: DC | PRN
  Filled 2016-09-15: qty 5
  Filled 2016-09-15: qty 10

## 2016-09-15 MED ORDER — BENZOCAINE-MENTHOL 20-0.5 % EX AERO
1.0000 "application " | INHALATION_SPRAY | CUTANEOUS | Status: DC | PRN
  Filled 2016-09-15: qty 56

## 2016-09-15 MED ORDER — ACETAMINOPHEN 325 MG PO TABS: 650.0000 mg | ORAL_TABLET | ORAL | Status: DC | PRN

## 2016-09-15 MED ORDER — LACTATED RINGERS IV SOLN
500.0000 mL | Freq: Once | INTRAVENOUS | Status: AC
  Administered 2016-09-15: 500 mL via INTRAVENOUS

## 2016-09-15 MED ORDER — SIMETHICONE 80 MG PO CHEW
80.0000 mg | CHEWABLE_TABLET | ORAL | Status: DC | PRN
Start: 2016-09-15 — End: 2016-09-17

## 2016-09-15 MED ORDER — DEXTROSE IN LACTATED RINGERS 5 % IV SOLN
INTRAVENOUS | Status: DC
  Administered 2016-09-15: 125 mL/h via INTRAVENOUS

## 2016-09-15 MED ORDER — WITCH HAZEL-GLYCERIN EX PADS: 1.0000 "application " | MEDICATED_PAD | CUTANEOUS | Status: DC | PRN

## 2016-09-15 MED ORDER — DIPHENHYDRAMINE HCL 25 MG PO CAPS: 25.0000 mg | ORAL_CAPSULE | Freq: Four times a day (QID) | ORAL | Status: DC | PRN

## 2016-09-15 MED ORDER — PHENYLEPHRINE 40 MCG/ML (10ML) SYRINGE FOR IV PUSH (FOR BLOOD PRESSURE SUPPORT)
80.0000 ug | PREFILLED_SYRINGE | INTRAVENOUS | Status: DC | PRN
  Filled 2016-09-15: qty 5

## 2016-09-15 MED ORDER — FENTANYL 2.5 MCG/ML BUPIVACAINE 1/10 % EPIDURAL INFUSION (WH - ANES)
14.0000 mL/h | INTRAMUSCULAR | Status: DC | PRN
  Administered 2016-09-15 (×2): 14 mL/h via EPIDURAL
  Filled 2016-09-15 (×2): qty 100

## 2016-09-15 MED ORDER — PRENATAL MULTIVITAMIN CH
1.0000 | ORAL_TABLET | Freq: Every day | ORAL | Status: DC
  Administered 2016-09-16: 1 via ORAL
  Filled 2016-09-15: qty 1

## 2016-09-15 MED ORDER — ZOLPIDEM TARTRATE 5 MG PO TABS: 5.0000 mg | ORAL_TABLET | Freq: Every evening | ORAL | Status: DC | PRN

## 2016-09-15 MED ORDER — SENNOSIDES-DOCUSATE SODIUM 8.6-50 MG PO TABS
2.0000 | ORAL_TABLET | ORAL | Status: DC
  Administered 2016-09-15 – 2016-09-17 (×2): 2 via ORAL
  Filled 2016-09-15 (×2): qty 2

## 2016-09-15 MED ORDER — EPHEDRINE 5 MG/ML INJ
10.0000 mg | INTRAVENOUS | Status: DC | PRN
  Filled 2016-09-15: qty 2

## 2016-09-15 MED ORDER — LIDOCAINE HCL (PF) 1 % IJ SOLN
INTRAMUSCULAR | Status: DC | PRN
  Administered 2016-09-15: 6 mL via EPIDURAL
  Administered 2016-09-15: 4 mL

## 2016-09-15 MED ORDER — ONDANSETRON HCL 4 MG PO TABS
4.0000 mg | ORAL_TABLET | ORAL | Status: DC | PRN
Start: 2016-09-15 — End: 2016-09-17

## 2016-09-15 MED ORDER — ONDANSETRON HCL 4 MG/2ML IJ SOLN: 4.0000 mg | INTRAMUSCULAR | Status: DC | PRN

## 2016-09-15 MED ORDER — COCONUT OIL OIL: 1.0000 "application " | TOPICAL_OIL | Status: DC | PRN

## 2016-09-15 MED ORDER — DIBUCAINE 1 % RE OINT: 1.0000 "application " | TOPICAL_OINTMENT | RECTAL | Status: DC | PRN

## 2016-09-15 MED ORDER — LACTATED RINGERS IV SOLN
INTRAVENOUS | Status: DC
  Administered 2016-09-15: 07:00:00 via INTRAUTERINE

## 2016-09-15 MED ORDER — DIPHENHYDRAMINE HCL 50 MG/ML IJ SOLN: 12.5000 mg | INTRAMUSCULAR | Status: DC | PRN

## 2016-09-15 NOTE — Anesthesia Preprocedure Evaluation (Signed)
Anesthesia Evaluation  Patient identified by MRN, date of birth, ID band Patient awake    Reviewed: Allergy & Precautions, H&P , Patient's Chart, lab work & pertinent test results  Airway Mallampati: II  TM Distance: >3 FB Neck ROM: full    Dental  (+) Teeth Intact   Pulmonary former smoker,    breath sounds clear to auscultation       Cardiovascular  Rhythm:regular Rate:Normal     Neuro/Psych    GI/Hepatic   Endo/Other  diabetes, GestationalMorbid obesity  Renal/GU      Musculoskeletal   Abdominal   Peds  Hematology   Anesthesia Other Findings       Reproductive/Obstetrics (+) Pregnancy                             Anesthesia Physical Anesthesia Plan  ASA: III  Anesthesia Plan: Epidural   Post-op Pain Management:    Induction:   PONV Risk Score and Plan:   Airway Management Planned:   Additional Equipment:   Intra-op Plan:   Post-operative Plan:   Informed Consent: I have reviewed the patients History and Physical, chart, labs and discussed the procedure including the risks, benefits and alternatives for the proposed anesthesia with the patient or authorized representative who has indicated his/her understanding and acceptance.   Dental Advisory Given  Plan Discussed with:   Anesthesia Plan Comments: (Labs checked- platelets confirmed with RN in room. Fetal heart tracing, per RN, reported to be stable enough for sitting procedure. Discussed epidural, and patient consents to the procedure:  included risk of possible headache,backache, failed block, allergic reaction, and nerve injury. This patient was asked if she had any questions or concerns before the procedure started.)        Anesthesia Quick Evaluation  

## 2016-09-15 NOTE — Progress Notes (Signed)
Patient requested translator to prepare to push.  Called translator, with a patient but will come as soon as possible.  Used the iPad interpreter until the in person interpreter came to the bedside.

## 2016-09-15 NOTE — Progress Notes (Signed)
Discuss POC to start pit and ancef via interpreter [phone]

## 2016-09-15 NOTE — Progress Notes (Addendum)
Patient ID: Stana BuntingFatima Nguyen, female   DOB: [birth year], 26 y.o.   MRN: [id number]  Feeling cramping; rec'd one dose Ancef; used Kristy Nguyen interpreter on phone  BP 115/58, P 118 FHR 150s, early variables to 110-90s with ctx most likely, although difficult to see ctx Ctx q 2-5 mins w/ Pit @ 892mu/min Cx 4-5/70/-2; BBOW  CBG 111  IUP@term  Oligo GDMA2 GBS pos  AROM for small clear fluid; IUPC inserted without difficulty Will start amnioinfusion for variables  SHAW, KIMBERLY 09/15/2016 6:45 AM

## 2016-09-15 NOTE — Progress Notes (Addendum)
Pt requesting for IV pain med. Unable to administer due to FHR, pt also refused epidural. discussed with pt via interpreter [phone]

## 2016-09-15 NOTE — Progress Notes (Signed)
Patient is voicing understanding and tells me she will ask for our assistance if needed. Kristy Nguyen Interpreter.

## 2016-09-15 NOTE — Anesthesia Procedure Notes (Signed)
Epidural Patient location during procedure: OB  Staffing Anesthesiologist: Cristela BlueJACKSON, Letita Prentiss  Preanesthetic Checklist Completed: patient identified, site marked, surgical consent, pre-op evaluation, timeout performed, IV checked, risks and benefits discussed and monitors and equipment checked  Epidural Patient position: sitting Prep: site prepped and draped and DuraPrep Patient monitoring: continuous pulse ox and blood pressure Approach: midline Location: L3-L4 Injection technique: LOR air  Needle:  Needle type: Tuohy  Needle gauge: 17 G Needle length: 9 cm and 9 Needle insertion depth: 5 cm cm Catheter type: closed end flexible Catheter size: 19 Gauge Catheter at skin depth: 11 cm Test dose: negative  Assessment Events: blood not aspirated, injection not painful, no injection resistance, negative IV test and no paresthesia  Additional Notes Dosing of Epidural:  1st dose, through catheter ............................................Marland Kitchen.  Xylocaine 40 mg  2nd dose, through catheter, after waiting 3 minutes........Marland Kitchen.Xylocaine 60 mg    As each dose occurred, patient was free of IV sx; and patient exhibited no evidence of SA injection.  Patient is more comfortable after epidural dosed. Please see RN's note for documentation of vital signs,and FHR which are stable.  Patient reminded not to try to ambulate with numb legs, and that an RN must be present when she attempts to get up.

## 2016-09-15 NOTE — Anesthesia Pain Management Evaluation Note (Signed)
  CRNA Pain Management Visit Note  Patient: Kristy BuntingFatima Nguyen, 26 y.o., female  "Hello [facility] am a [facility]. We have an anesthesia team available at all times to provide care throughout the hospital, including epidural management and anesthesia for C-section. I don't know your plan for the delivery whether it a natural birth, water birth, IV sedation, nitrous supplementation, doula or epidural, but we want to meet your pain goals."   1.Was your pain managed to your expectations on prior hospitalizations?   No prior hospitalizations  2.What is your expectation for pain management during this hospitalization?     Epidural  3.How can we help you reach that goal? Epidural  Record the patient's initial score and the patient's pain goal.   Pain: 10, now a 5  Pain Goal: 6 [facility] wants you to be able to say your pain was always managed very well.  Kristy Nguyen 09/15/2016

## 2016-09-15 NOTE — Progress Notes (Signed)
Spoke to Alcoa IncKim Shaw to report baby's heart rate and late decels.  Philipp DeputyKim Shaw requested that a dose of terb be given per standing orders. I will continue to monitor.

## 2016-09-15 NOTE — Lactation Note (Signed)
This note was copied from a baby's chart. Lactation Consultation Note  Patient Name: Kristy Nguyen UJWJX'BToday's Date: 09/15/2016 Reason for consult: Initial assessment;Other (Comment) (Hypoglycemic.)  Baby 3 hours old. Bonnye FavaViria, in-house Spanish interpreter assisted with consultation. Assisted mom with positioning and attempted to latch baby to left breast in football position. Baby lethargic and would not latch. Assisted mom with hand expression, but mom has no colostrum flowing at this time. Discussed and demonstrated how to spoon-feed when colostrum flowing. Enc mom to keep baby STS and attempt to nurse with cues. Enc mom to keep hand expressing and spoon feed whatever she is able to collect. Enc FOB to watch mom while holding baby STS to make sure she is not sleeping while holding baby.   Discussed assessment and interventions with VenezuelaSydney, Charity fundraiserN and Public relations account executiveoncoming RN.   Maternal Data Has patient been taught Hand Expression?: Yes Does the patient have breastfeeding experience prior to this delivery?: No  Feeding Feeding Type: Breast Fed Length of feed: 0 min  LATCH Score Latch: Too sleepy or reluctant, no latch achieved, no sucking elicited.  Audible Swallowing: None  Type of Nipple: Flat (Inverts with compression.)  Comfort (Breast/Nipple): Soft / non-tender  Hold (Positioning): Assistance needed to correctly position infant at breast and maintain latch.  LATCH Score: 4  Interventions Interventions: Breast feeding basics reviewed;Assisted with latch;Skin to skin;Hand express;Breast compression;Adjust position;Support pillows;Position options  Lactation Tools Discussed/Used     Consult Status Consult Status: Follow-up Date: 09/16/16 Follow-up type: In-patient    Sherlyn HayJennifer D Trudi Morgenthaler 09/15/2016, 7:09 PM

## 2016-09-15 NOTE — Progress Notes (Addendum)
Kristy BuntingFatima Nguyen is a 26 y.o. G2P0010 at 5727w2d admitted for IOL due to hydramnios  Subjective: Patient reported more pressure and lower pelvis discomfort.   Objective: BP (!) 103/53   Pulse (!) 132   Temp 98.8 F (37.1 Kristy) (Oral)   Resp (!) 22   Ht 5\' 1"  (1.549 m)   Wt 102.5 kg (226 lb)   LMP 12/29/2015   SpO2 95%   BMI 42.70 kg/m  I/O last 3 completed shifts: In: 1703.7 [I.V.:1338.7; Other:365] Out: -  Total I/O In: -  Out: 300 [Urine:300]  FHT:  FHR: 160 bpm, variability: moderate,  accelerations:  Present,  decelerations:  Absent UC:   regular, every 3-4 minutes SVE:   Dilation: 4.5 Effacement (%): 70 Station: -2 Exam by:: Kristy BadderK. Nguyen, cnm  Labs: Lab Results  Component Value Date   WBC 12.9 (H) 09/14/2016   HGB 11.8 (L) 09/14/2016   HCT 36.6 09/14/2016   MCV 89.7 09/14/2016   PLT 318 09/14/2016    Assessment / Plan: Induction of labor due to hydramions,  progressing well on pitocin  Labor: No progression signficant change in Cervical dilation since 0100 09/15/2016.  Fetal Wellbeing:  Category I Pain Control:  Epidural I/D:  GBS postive Anticipated MOD:  NSVD  Kristy Nguyen Kristy Nguyen 09/15/2016, 11:40 AM

## 2016-09-15 NOTE — Progress Notes (Addendum)
Patient ID: Stana BuntingFatima Nguyen, female   DOB: [birth year], 26 y.o.   MRN: [id number]  Foley bulb came out; cytotec @ 2100  VSS, afeb FHR 140s, +accels, no decels Ctx irreg Cx was 4/70/-1  CBG 96  IUP@term  Oligo GBS pos GDMA2 Cx favorable   Will start Pit Ancef for GBS ppx  Necie Wilcoxson CNM 09/15/2016 2:30 AM

## 2016-09-15 NOTE — Lactation Note (Addendum)
This note was copied from a baby's chart. Lactation Consultation Note  Patient Name: Boy Stana BuntingFatima Flores ZOXWR'UToday's Date: 09/15/2016    Baby 1 hour old. Mom up to the bathroom and about to be transferred. Discussed with mom that this LC will assist with latching baby after mom in her room on MBU.   Maternal Data    Feeding    LATCH Score                   Interventions    Lactation Tools Discussed/Used     Consult Status      Sherlyn HayJennifer D Dshawn Mcnay 09/15/2016, 5:42 PM

## 2016-09-16 LAB — GLUCOSE, CAPILLARY: Glucose-Capillary: 105 mg/dL — ABNORMAL HIGH (ref 65–99)

## 2016-09-16 NOTE — Anesthesia Postprocedure Evaluation (Signed)
Anesthesia Post Note  Patient: Kristy Nguyen  Procedure(s) Performed: * No procedures listed *     Patient location during evaluation: Mother Baby Anesthesia Type: Epidural Level of consciousness: awake and alert and oriented Pain management: satisfactory to patient Vital Signs Assessment: post-procedure vital signs reviewed and stable Respiratory status: spontaneous breathing and nonlabored ventilation Cardiovascular status: stable Postop Assessment: no headache, no backache, no signs of nausea or vomiting, adequate PO intake and patient able to bend at knees (patient up walking) Anesthetic complications: no    Last Vitals:  Vitals:   09/15/16 1847 09/15/16 2345  BP: 120/74 126/65  Pulse: 81 (!) 102  Resp: 18 18  Temp: 37 C 36.8 C    Last Pain:  Vitals:   09/16/16 0530  TempSrc:   PainSc: 0-No pain   Pain Goal:                 Hennessey Cantrell

## 2016-09-16 NOTE — Progress Notes (Signed)
Interpreter in to go over plan care and answered any questions. Mother encouraged to feed infant.

## 2016-09-16 NOTE — Progress Notes (Signed)
POSTPARTUM PROGRESS NOTE  Post Partum Day 1 Subjective:  Kristy Nguyen is a 26 y.o. G2P1011 3362w2d s/p svd.  No acute events overnight.  Pt denies problems with ambulating, voiding or po intake.  She denies nausea or vomiting.  Pain is well controlled.  She has had flatus. She has not had bowel movement.  Lochia Minimal.   Objective: Blood pressure 126/65, pulse (!) 102, temperature 98.3 F (36.8 C), temperature source Oral, resp. rate 18, height 5\' 1"  (1.549 m), weight 102.5 kg (226 lb), last menstrual period 12/29/2015, SpO2 97 %, unknown if currently breastfeeding.  Physical Exam:  General: alert, cooperative and no distress Lochia:normal flow Chest: CTAB Heart: RRR no m/r/g Abdomen: +BS, soft, nontender,  Uterine Fundus: firm DVT Evaluation: No calf swelling or tenderness Extremities: no edema   Recent Labs  09/14/16 2020  HGB 11.8*  HCT 36.6    Assessment/Plan:  ASSESSMENT: Kristy Nguyen is a 26 y.o. G2P1011 5862w2d s/p svd  Plan for discharge tomorrow   LOS: 2 days   Sullivan LoneBrannon L Inman, Medical Student   CNM attestation Post Partum Day #1 I have seen and examined this patient and agree with above documentation in the medical student's note.   Kristy Nguyen is a 26 y.o. G2P1011 s/p SVD.  Pt denies problems with ambulating, voiding or po intake. Pain is well controlled.  Plan for birth control is Nexplanon.  Method of Feeding: both  PE:  BP 126/65 (BP Location: Left Arm)   Pulse (!) 102   Temp 98.3 F (36.8 C) (Oral)   Resp 18   Ht 5\' 1"  (1.549 m)   Wt 102.5 kg (226 lb)   LMP 12/29/2015   SpO2 97%   Breastfeeding? Unknown   BMI 42.70 kg/m  Fundus firm  FBS: 105  Plan for discharge: 09/17/16  Cam HaiSHAW, KIMBERLY, CNM 8:42 AM 09/16/2016

## 2016-09-16 NOTE — Lactation Note (Addendum)
This note was copied from a baby's chart. Lactation Consultation Note  Patient Name: Kristy Nguyen XBMWU'XToday's Date: 09/16/2016 Reason for consult: Follow-up assessment;Difficult latch Mom finishing formula feeding when I came in.  She states she has no milk.  Discussed colostrum and milk coming to volume.  Assisted with postioning baby in football hold. Hand expression done but not colostrum seen.  Baby is not showing interest in feeding.  Mom has short shaft nipples.  Instructed mom to always attempt breast before bottle and call for assist prn.  Maternal Data    Feeding Feeding Type: Breast Fed  LATCH Score Latch: Repeated attempts needed to sustain latch, nipple held in mouth throughout feeding, stimulation needed to elicit sucking reflex.  Audible Swallowing: None  Type of Nipple: Everted at rest and after stimulation (short)  Comfort (Breast/Nipple): Soft / non-tender  Hold (Positioning): Assistance needed to correctly position infant at breast and maintain latch.  LATCH Score: 6  Interventions Interventions: Assisted with latch;Breast compression;Adjust position;Support pillows;Position options;Hand express  Lactation Tools Discussed/Used     Consult Status Consult Status: Follow-up Date: 09/17/16 Follow-up type: In-patient    Huston FoleyMOULDEN, Zaneta Lightcap S 09/16/2016, 10:48 AM

## 2016-09-17 NOTE — Discharge Summary (Signed)
OB Discharge Summary     Patient Name: Kristy Nguyen DOB: [birth year] MRN: [id number]  Date of admission: 09/14/2016 Delivering MD: Katheren Shams   Date of discharge: 09/17/2016  Admitting diagnosis: Induction  Intrauterine pregnancy: [redacted]w[redacted]d    Secondary diagnosis:  Active Problems:   Oligohydramnios   NSVD (normal spontaneous vaginal delivery)  Additional problems:  Patient Active Problem List   Diagnosis Date Noted  . NSVD (normal spontaneous vaginal delivery) 09/17/2016  . Oligohydramnios 09/14/2016  . Group B streptococcal infection during pregnancy 09/11/2016  . DM (diabetes mellitus), gestational 08/05/2016  . Supervision of high-risk pregnancy 04/05/2016     Discharge diagnosis: Term Pregnancy Delivered and GDM A2                                                                                                Post partum procedures:none  Augmentation: AROM and Pitocin  Complications: Shoulder dystocia  Hospital course:  Induction of Labor With Vaginal Delivery   26y.o. yo G2P1011 at 378w2das admitted to the hospital 09/14/2016 for induction of labor.  Indication for induction: A2 DM and oligohydraminos.  Patient had an uncomplicated labor course as follows: Membrane Rupture Time/Date: 6:30 AM ,09/15/2016   Intrapartum Procedures: Episiotomy: None [1]                                         Lacerations:  None [1]  Patient had delivery of a Viable infant.  Information for the patient's newborn:  FlFloriene, Jeschke0[[id number]]Delivery Method: Vaginal, Spontaneous Delivery (Filed from Delivery Summary)   09/15/2016  Details of delivery can be found in separate delivery note.  Patient had a routine postpartum course. Patient is discharged home 09/17/16.  Physical exam  Vitals:   09/15/16 1847 09/15/16 2345 09/16/16 1738 09/17/16 0550  BP: 120/74 126/65 (!) 98/51 (!) 113/54  Pulse: 81 (!) 102 100 88  Resp: '18 18 18 16  '$ Temp: 98.6 F (37 C) 98.3 F (36.8 C) 97.8 F  (36.6 C) 97.7 F (36.5 C)  TempSrc:  Oral Oral Oral  SpO2:      Weight:      Height:       General: alert, cooperative and no distress Lochia: appropriate Uterine Fundus: firm DVT Evaluation: No evidence of DVT seen on physical exam. Labs: Lab Results  Component Value Date   WBC 12.9 (H) 09/14/2016   HGB 11.8 (L) 09/14/2016   HCT 36.6 09/14/2016   MCV 89.7 09/14/2016   PLT 318 09/14/2016   CMP Latest Ref Rng & Units 08/05/2016  Glucose 65 - 99 mg/dL 90  BUN 6 - 20 mg/dL 5(L)  Creatinine 0.57 - 1.00 mg/dL 0.48(L)  Sodium 134 - 144 mmol/L 136  Potassium 3.5 - 5.2 mmol/L 4.3  Chloride 96 - 106 mmol/L 100  CO2 20 - 29 mmol/L 20  Calcium 8.7 - 10.2 mg/dL 10.3(H)  Total Protein 6.0 - 8.5 g/dL 7.1  Total Bilirubin 0.0 - 1.2 mg/dL  0.2  Alkaline Phos 39 - 117 IU/L 138(H)  AST 0 - 40 IU/L 11  ALT 0 - 32 IU/L 6    Discharge instruction: per After Visit Summary and "Baby and Me Booklet".  After visit meds:  Allergies as of 09/17/2016      Reactions   Fish Allergy Rash   Penicillins Rash, Other (See Comments)   Has patient had a PCN reaction causing immediate rash, facial/tongue/throat swelling, SOB or lightheadedness with hypotension: No Has patient had a PCN reaction causing severe rash involving mucus membranes or skin necrosis: No Has patient had a PCN reaction that required hospitalization No Has patient had a PCN reaction occurring within the last 10 years: No If all of the above answers are "NO", then may proceed with Cephalosporin use.      Medication List    STOP taking these medications   COMFORT FIT MATERNITY SUPP MED Misc   diphenhydrAMINE 25 mg capsule Commonly known as:  BENADRYL   metFORMIN 500 MG tablet Commonly known as:  GLUCOPHAGE   nitrofurantoin (macrocrystal-monohydrate) 100 MG capsule Commonly known as:  MACROBID   terconazole 0.4 % vaginal cream Commonly known as:  TERAZOL 7     TAKE these medications   ACCU-CHEK AVIVA PLUS w/Device  Kit USE AS DIRECTED   ACCU-CHEK FASTCLIX LANCETS Misc 1 each by Percutaneous route 4 (four) times daily.   glucose blood test strip Commonly known as:  ACCU-CHEK AVIVA PLUS 1 each by Other route 4 (four) times daily. Use as instructed   Prenatal Vitamins 0.8 MG tablet Take 1 tablet by mouth daily.       Diet: routine diet  Activity: Advance as tolerated. Pelvic rest for 6 weeks.   Outpatient follow up:6 weeks Follow-up [place] for [facility]. Schedule an appointment as soon as possible for a visit.   Specialty:  Obstetrics and Gynecology Why:  For post-partum visit in 6 weeks Contact information: [address] [place] [phone]         Postpartum contraception: Nexplanon  Newborn Data: Live born female  Birth Weight: 7 lb 1.4 oz (3215 g) APGAR: , 8  Baby Feeding: Bottle and Breast Disposition:home with mother  Luiz Blare, DO [facility], [place] 09/17/2016, 9:22 AM

## 2016-09-17 NOTE — Discharge Instructions (Signed)

## 2016-09-20 ENCOUNTER — Ambulatory Visit: Payer: Self-pay

## 2016-09-20 NOTE — Lactation Note (Signed)
This note was copied from a baby's chart. Lactation Consultation Note  Patient Name: Kristy Nguyen Today's Date: 09/20/2016   Called and spoke with Gunnar FusiPaula, RN who reports mom has a hand pump at home. She is using a DEBP on Peds.   Called and spoke with mom with assistance of hospital interpreter, Eda Royal in infant's room on Peds. Infant is a patient on Pediatric unit due to Hyperbilirubinemia,  Infant may be d/c home today.   Mom reports she is latching infant to the breast. She reports it has been difficult latching infant to the breast due to flat nipples. She reports there is pain with some feedings with latch and she relatches infant as needed and it usually helps. She reports her nipples are cracked, enc her to apply EBM and coconut oil to nipples post feeding.   Mom is pumping with DEBP while infant in the hospital. She reports her breasts soften with pumping. She has a Harmony manual pump at home for use. Mom is calling WIC to see if she can get in to get a pump today or tomorrow. [facility].   Enc mom to offer breast with each feeding. Enc mom to pump post BF at least every 3 hours and to offer infant EBM in bottle post BF.   Offered mom OP appt and she asked "for what, I'm already putting my baby to breast" Discussed we can help her BF her infant and that I was informed she wanted LC assistance. She reports she has no transportation as FOB works and [facility] able [facility]. [facility]. Informed mom there are several OP openings this week.   Mom reports she has no further questions/concerns at this time.        Maternal Data    Feeding Feeding Type: Bottle Fed - Formula Nipple Type: Slow - flow  LATCH Score                   Interventions    Lactation Tools Discussed/Used     Consult Status      Silas FloodSharon S  Aundra Espin 09/20/2016, 9:09 AM

## 2016-09-21 ENCOUNTER — Encounter: Payer: Self-pay | Admitting: Obstetrics and Gynecology

## 2016-09-21 ENCOUNTER — Ambulatory Visit (HOSPITAL_COMMUNITY): Payer: Medicaid Other

## 2016-09-26 ENCOUNTER — Encounter: Payer: Self-pay | Admitting: General Practice

## 2016-09-27 ENCOUNTER — Inpatient Hospital Stay (HOSPITAL_COMMUNITY): Payer: Medicaid Other

## 2016-09-27 ENCOUNTER — Encounter (HOSPITAL_COMMUNITY): Payer: Self-pay | Admitting: *Deleted

## 2016-09-28 ENCOUNTER — Encounter: Payer: Self-pay | Admitting: Obstetrics and Gynecology

## 2016-10-26 ENCOUNTER — Ambulatory Visit (INDEPENDENT_AMBULATORY_CARE_PROVIDER_SITE_OTHER): Payer: Medicaid Other | Admitting: Advanced Practice Midwife

## 2016-10-26 ENCOUNTER — Encounter: Payer: Self-pay | Admitting: Advanced Practice Midwife

## 2016-10-26 VITALS — BP 117/77 | HR 74 | Wt 210.7 lb

## 2016-10-26 DIAGNOSIS — Z3049 Encounter for surveillance of other contraceptives: Secondary | ICD-10-CM | POA: Diagnosis present

## 2016-10-26 DIAGNOSIS — Z3009 Encounter for other general counseling and advice on contraception: Secondary | ICD-10-CM

## 2016-10-26 DIAGNOSIS — Z30017 Encounter for initial prescription of implantable subdermal contraceptive: Secondary | ICD-10-CM

## 2016-10-26 DIAGNOSIS — A630 Anogenital (venereal) warts: Secondary | ICD-10-CM

## 2016-10-26 DIAGNOSIS — Z8632 Personal history of gestational diabetes: Secondary | ICD-10-CM

## 2016-10-26 LAB — POCT PREGNANCY, URINE: Preg Test, Ur: NEGATIVE

## 2016-10-26 MED ORDER — ETONOGESTREL 68 MG ~~LOC~~ IMPL
68.0000 mg | DRUG_IMPLANT | Freq: Once | SUBCUTANEOUS | Status: AC
  Administered 2016-10-26: 68 mg via SUBCUTANEOUS

## 2016-10-26 NOTE — Progress Notes (Signed)
Subjective:     Kristy Nguyen is a 26 y.o. female who presents for a postpartum visit. She is 5 weeks postpartum following a delivery 09/15/2016. I have fully reviewed the prenatal and intrapartum course. The delivery was at 37 gestational weeks. Outcome: vaginal . Anesthesia Epidual: . Postpartum course has been uncomplicated. Baby's course has been uncomplicated. Baby is feeding by bottle. Tried breastfeeding, but baby would not latch. Bleeding some bleeding. Bowel function is normal. Bladder function is normal. Patient is not sexually active. Contraception method is nothing at this time. Postpartum depression screening: negative.  Last Pap 03/2016 Nml.  Spanish video interpreter used.  Review of Systems Pertinent items are noted in HPI.   Objective:    BP 117/77   Pulse 74   Wt 210 lb 11.2 oz (95.6 kg)   BMI 39.81 kg/m   General:  alert, cooperative, appears stated age and moderately obese   Breasts:  declined  Lungs: clear to auscultation bilaterally  Heart:  regular rate and rhythm, S1, S2 normal, no murmur, click, rub or gallop  Abdomen: soft, non-tender; bowel sounds normal; no masses,  no organomegaly        Procedure Patient given informed consent, signed copy in the chart, time out was performed. Pregnancy test was Negative. Appropriate time out taken.  Patient's right arm was prepped and draped in the usual sterile fashion.. The ruler used to measure and mark insertion area.  Pt was prepped with alcohol swab and then injected with 2 cc of 1% lidocaine.  Pt was prepped with betadine, Nexplanon removed form packaging,  Device confirmed in needle, then inserted full length of needle and withdrawn per handbook instructions.  Pt insertion site covered with Pressure dressing.   Minimal blood loss.  Pt tolerated the procedure well.   Assessment:     Nml postpartum exam. Pap smear not done at today's visit.    1. Nexplanon insertion - etonogestrel (NEXPLANON) implant 68 mg; 68 mg  by Subdermal route once.  2. Genital warts  3. Birth control counseling  4. Postpartum care and examination  5. History of gestational diabetes  Other orders - Pregnancy, urine POC   Plan:    1. Contraception: Nexplanon 2. Back up BC x 1 week 3. Follow up in: 1 year or as needed.

## 2016-10-26 NOTE — Patient Instructions (Signed)
Etonogestrel implant Qu es este medicamento? El ETONOGESTREL es un dispositivo anticonceptivo (control de la natalidad). Se usa para evitar los embarazos. Se puede usar hasta por 3 aos. Este medicamento puede ser utilizado para otros usos; si tiene alguna pregunta consulte con su proveedor de atencin mdica o con su farmacutico. MARCAS COMUNES: Implanon, Nexplanon Qu le debo informar a mi profesional de la salud antes de tomar este medicamento? Necesita saber si usted presenta alguno de los siguientes problemas o situaciones: sangrado vaginal anormal enfermedad vascular o cogulos sanguneos cncer de mama, cervical, heptico depresin diabetes enfermedad de la vescula biliar dolores de cabeza enfermedad cardiaca o ataque cardiaco reciente alta presin sangunea alto nivel de colesterol enfermedad renal enfermedad heptica convulsiones fuma tabaco una reaccin alrgica o inusual al etonogestrel, otras hormonas, anestsicos o antispticos, medicamentos, alimentos, colorantes o conservantes si est embarazada o buscando quedar embarazada si est amamantando a un beb Cmo debo utilizar este medicamento? Un profesional de la salud inserta este dispositivo justo debajo de la piel en la parte interior del brazo. Hable con su pediatra para informarse acerca del uso de este medicamento en nios. Puede requerir atencin especial. Sobredosis: Pngase en contacto inmediatamente con un centro toxicolgico o una sala de urgencia si usted cree que haya tomado demasiado medicamento. ATENCIN: Este medicamento es solo para usted. No comparta este medicamento con nadie. Qu sucede si me olvido de una dosis? No se aplica en este caso. Qu puede interactuar con este medicamento? No tome este medicamento con ninguno de los siguientes frmacos: amprenavir bosentano fosamprenavir Este medicamento tambin podra interactuar con los siguientes medicamentos: barbitricos para inducir el sueo o para el  tratamiento de convulsiones ciertos medicamentos para infecciones micticas, tales como itraconazol y ketoconazol jugo de toronja griseofulvina medicamentos para tratar convulsiones, tales como carbamazepina, felbamato, oxcarbazepina, fenitona, topiramato modafinilo fenilbutazona rifampicina rufinamida algunos medicamentos para tratar la infeccin por el VIH, tales como atazanavir, indinavir, lopinavir, nelfinavir, tipranavir, ritonavir hierba de [place] Puede ser que esta lista no menciona todas las posibles interacciones. Informe a su profesional de la salud de todos los productos a base de hierbas, medicamentos de venta libre o suplementos nutritivos que est tomando. Si usted fuma, consume bebidas alcohlicas o si utiliza drogas ilegales, indqueselo tambin a su profesional de la salud. Algunas sustancias pueden interactuar con su medicamento. A qu debo estar atento al usar este medicamento? Este producto no protege contra la infeccin por el VIH (SIDA) u otras enfermedades de transmisin sexual. Usted debe sentir el implante al presionar con las yemas de los dedos sobre la piel donde se insert. Contacte a su mdico si no se siente el implante y usa un mtodo anticonceptivo no hormonal (como el condn) hasta que el mdico confirma que el implante est en su lugar. Si siente que el implante puede haber roto o doblado en su brazo, pngase en contacto con su proveedor de atencin mdica. Qu efectos secundarios puedo tener al utilizar este medicamento? Efectos secundarios que debe informar a su mdico o a su profesional de la salud tan pronto como sea posible: reacciones alrgicas, como erupcin cutnea, picazn o urticarias, e hinchazn de la cara, los labios o la lengua bultos en las mamas cambios en las emociones o el estado de nimo estado de nimo deprimido sangrado menstrual intenso o prolongado dolor, irritacin, hinchazn o moretones en el lugar de la insercin cicatriz en el lugar de la  insercin signos de infeccin en el sitio de insercin tales como fiebre, y enrojecimiento de   la piel, dolor o secrecin signos de Psychiatrist signos y sntomas de un cogulo sanguneo, tales como problemas respiratorios; cambios en la visin; dolor en el pecho; dolor de cabeza severo, repentino; dolor, hinchazn, calor en la pierna; dificultad para hablar; entumecimiento o debilidad repentina de la cara, el brazo o la pierna signos y sntomas de lesin al hgado, como orina amarilla oscura o [place]; sensacin general de estar enfermo o sntomas gripales; heces claras; prdida de apetito; nuseas; dolor en la regin abdominal superior derecha; cansancio o debilidad inusual; color amarillento de los ojos o la piel sangrado vaginal inusual, secrecin signos y sntomas de un derrame cerebral, tales como cambios en la visin; confusin; dificultad para hablar o entender; dolores de cabeza severos; entumecimiento o debilidad repentina de la cara, el brazo o la pierna; problemas al Advertising account planner; Psychiatrist; prdida del equilibrio o la coordinacin Efectos secundarios que generalmente no requieren Psychologist, prison and probation services (infrmelos a su mdico o a Producer, television/film/video de la salud si persisten o si son molestos): acn dolor de Retail buyer en las mamas cambios de peso mareos sensacin general de estar enfermo o sntomas gripales dolor de cabeza sangrado menstrual irregular nuseas dolor de garganta irritacin o inflamacin vaginal Puede ser que esta lista no menciona todos los posibles efectos secundarios. Comunquese a su mdico por asesoramiento mdico [employer]. Usted puede informar los efectos secundarios a la FDA por telfono al 1-800-FDA-1088. Dnde debo guardar mi medicina? Este medicamento se administra en hospitales o clnicas y no necesitar guardarlo en su domicilio. ATENCIN: Este folleto es un resumen. Puede ser que no cubra toda la posible informacin. Si usted tiene preguntas acerca de esta medicina,  consulte con su mdico, su farmacutico o su profesional de Radiographer, therapeutic.  2018 Elsevier/Gold Standard (2016-03-03 00:00:00)   Glendell Docker genitales (Genital Warts) Las verrugas genitales son Neomia Dear ETS (enfermedad de transmisin sexual) frecuente. Pueden tener el aspecto de bultos pequeos en los tejidos de la zona genital o anal. A veces, pueden irritarse y Programmer, multimedia. Las verrugas genitales se contagian fcilmente a Economist a travs del contacto sexual. Es importante recibir tratamiento porque estas verrugas pueden causar otros problemas. En las mujeres, el virus que causa las verrugas genitales puede aumentar el riesgo de tener cncer de cuello de tero. CAUSAS Un virus llamado virus del Geneticist, molecular (VPH) causa las verrugas genitales. El VPH se contagia al [employer] sexuales sin proteccin con una persona infectada. Se puede transmitir durante el sexo vaginal, anal y oral. Muchas personas no saben que estn infectadas. Pueden estar infectadas durante aos sin tener ningn problema. Sin embargo, aunque no tengan problemas, pueden transmitir la infeccin a sus International aid/development worker. FACTORES DE RIESGO Es ms probable que las Multimedia programmer las siguientes personas:  Las que mantienen relaciones sexuales sin proteccin.  Las que tienen muchos compaeros sexuales.  Las que se vuelven sexualmente activas antes de los 16aos.  Los hombres que no estn circuncidados.  Las mujeres que tienen un compaero sexual que no est circuncidado.  Las personas cuyo sistema de defensa del organismo (sistema inmunitario) est debilitado debido a enfermedades o medicamentos.  Los fumadores. SNTOMAS Los sntomas de las verrugas genitales incluyen lo siguiente:  Pequeos crecimientos en la zona genital o anal. Estas verrugas suelen crecer en grupos.  Picazn o irritacin en la zona genital o la anal.  Sangrado proveniente de las verrugas.  Relaciones sexuales  dolorosas. DIAGNSTICO Generalmente, es posible diagnosticar las verrugas genitales por su aspecto en la vagina,  la vulva, el pene, el peritoneo, el ano o el recto. Adems, pueden realizarse estudios, por ejemplo:  Biopsia. Se toma una muestra de tejido para poder estudiarla con un microscopio.  Colposcopia. En las mujeres, se [place] un instrumento ptico constituido por lupas para examinar la vagina y el cuello del tero. Se pueden aplicar algunas soluciones para que las clulas del VPH cambien de color y se puedan ver con ms facilidad.  Una prueba de [place] en las mujeres.  Pruebas de deteccin de otras enfermedades de transmisin sexual. TRATAMIENTO El tratamiento de las verrugas genitales puede incluir lo siguiente:  Customer service managerAplicar medicamentos recetados en las verrugas, los cuales pueden ser soluciones o cremas.  Congelar las verrugas con nitrgeno lquido (crioterapia).  Quemar las verrugas a travs de los siguientes mtodos: ? Academic librarianTratamiento con lser. ? Una sonda con corriente elctrica (electrocauterizacin).  La inyeccin de una sustancia (antgeno de cndida o antgeno de tricofito) en las verrugas para ayudar a que el sistema inmunitario del organismo las combata.  Inyecciones de interfern.  Ciruga para extirpar [employer] verrugas. INSTRUCCIONES PARA EL CUIDADO EN EL HOGAR Medicamentos  Aplquese los medicamentos de venta libre y los recetados solamente como se lo haya indicado el mdico.  No trate las verrugas genitales con los medicamentos que se usan para el tratamiento de las verrugas de las manos.  Pregntele al mdico si puede usar cremas de venta libre para Associate Professoraliviar la picazn. Instrucciones generales  No toque ni rasque las verrugas.  No mantenga relaciones sexuales Librarian, academichasta completar el tratamiento.  Informe a sus excompaeros sexuales y a los Nature conservation officeractuales acerca de la afeccin que padece, ya que es posible que ellos tambin necesiten tratamiento.  Concurra a todas las  visitas de control como se lo haya indicado el mdico. Esto es importante.  Despus del tratamiento, use preservativos durante las relaciones sexuales para prevenir futuras infecciones. Otras indicaciones para las Avayamujeres  Las mujeres que tienen verrugas genitales tal vez deban someterse a ms exmenes de deteccin del cncer de tero. Este tipo de cncer es de crecimiento lento y es curable si se lo detecta en una etapa temprana. Las probabilidades de Warehouse managertener cncer de cuello de tero aumentan con el VPH.  Si queda embarazada, informe a su mdico que ha tenido VPH. El mdico la controlar rigurosamente durante el embarazo para asegurarse de que el feto est sano. PREVENCIN Pregntele al mdico si debe aplicarse las vacunas contra el VPH. Estas vacunas previenen algunas infecciones por el VPH y cnceres. Se recomienda aplicar la vacuna a los hombres y las mujeres que tienen entre 9 y [place]26aos. Esta no ser efectiva si usted ya tiene el VPH y no se recomienda a las Probation officermujeres embarazadas. SOLICITE ATENCIN MDICA SI:  Siente dolor en la zona de la piel tratada o esta est enrojecida e hinchada.  Tiene fiebre.  Siente un Engineer, maintenance (IT)malestar generalizado.  Palpa bultos en la zona genital o en la anal, o alrededor de estas.  Tiene sangrado en la zona genital o la anal.  Siente dolor durante las relaciones sexuales. Esta informacin no tiene Theme park managercomo fin reemplazar el consejo del mdico. Asegrese de hacerle al mdico cualquier pregunta que tenga. Document Released: 11/10/2004 Document Revised: 10/22/2014 Document Reviewed: 04/28/2014 Elsevier Interactive Patient Education  [employer]2018 Elsevier Inc.

## 2016-10-28 ENCOUNTER — Telehealth: Payer: Self-pay | Admitting: Clinical

## 2016-10-28 DIAGNOSIS — Z30017 Encounter for initial prescription of implantable subdermal contraceptive: Secondary | ICD-10-CM | POA: Insufficient documentation

## 2016-10-28 HISTORY — DX: Encounter for initial prescription of implantable subdermal contraceptive: Z30.017

## 2016-10-28 NOTE — Telephone Encounter (Signed)
Phone call to pt, after inquiry about bill received for [facility] visit for depression, though she denies being depressed.  Pt says she had pregnancy Medicaid, but that when she called Medicaid, she was told that it did not cover a visit for depression. Pt is informed that her depression screening in February 2018 was positive, and  is encouraged to apply for charity care, by picking up an application at [facility] front desk, and turning it in as soon as possible. Pt says she will be coming into [facility] soon, and agrees to pick up an application at that time.

## 2016-11-01 ENCOUNTER — Other Ambulatory Visit: Payer: Medicaid Other

## 2016-11-01 DIAGNOSIS — O99810 Abnormal glucose complicating pregnancy: Secondary | ICD-10-CM

## 2016-11-02 ENCOUNTER — Telehealth: Payer: Self-pay | Admitting: General Practice

## 2016-11-02 LAB — GLUCOSE TOLERANCE, 2 HOURS
GLUCOSE FASTING GTT: 91 mg/dL (ref 65–99)
GLUCOSE, 2 HOUR: 94 mg/dL (ref 65–139)

## 2016-11-02 NOTE — Telephone Encounter (Signed)
-----   Message from [name], DO sent at 11/02/2016  9:31 AM EDT ----- Normal 2hr GTT. Need annual exam in 1 year

## 2016-11-02 NOTE — Telephone Encounter (Signed)
Called patient with pacific interpreter [phone], no answer- unable to leave voicemail as it has not been set up yet. Will send letter

## 2017-04-04 ENCOUNTER — Ambulatory Visit: Payer: Self-pay | Admitting: Internal Medicine

## 2017-04-04 ENCOUNTER — Encounter: Payer: Self-pay | Admitting: Internal Medicine

## 2017-04-04 VITALS — BP 124/82 | HR 78 | Resp 12 | Ht 59.0 in | Wt 224.0 lb

## 2017-04-04 DIAGNOSIS — J019 Acute sinusitis, unspecified: Secondary | ICD-10-CM

## 2017-04-04 DIAGNOSIS — H1033 Unspecified acute conjunctivitis, bilateral: Secondary | ICD-10-CM

## 2017-04-04 MED ORDER — AZITHROMYCIN 500 MG PO TABS: ORAL_TABLET | ORAL | 0 refills | Status: DC

## 2017-04-04 NOTE — Progress Notes (Signed)
   Subjective:    Patient ID: Kristy Nguyen, female    DOB: [birth year], 27 y.o.   MRN: [id number]  HPI   Here to establish  1 week ago, throat hurt and lost voice.  Had a swollen "ball" in her left anterior cervical area. Thursday, her left ear started to burn, the next day, her left eye turned red.  She had a lot of swelling of her face and lids of left eye with yellow discharge.  In the mornings over the weekend, her eye would be swollen shut and the eye burned and itched.   Today, when she awakened, her right eye was swollen and draining the discharge.   She lives with her boyfriend and their 626 month old son.  No one else is ill.  Her mother is sitting for her son so the patient does not pass on the illness to her son. Is having posterior pharyngeal drainage.  Throat is itchy.  Sputum is yellow green.   No dyspnea No fever.   Has used unknown eye drop she purchased to get the red out--made it worse, so stopped.  No outpatient medications have been marked as taking for the 04/04/17 encounter (Office Visit) with Julieanne MansonMulberry, Mariangela Heldt, MD.    Allergies  Allergen Reactions  . Fish Allergy Rash  . Penicillins Rash and Other (See Comments)    Has patient had a PCN reaction causing immediate rash, facial/tongue/throat swelling, SOB or lightheadedness with hypotension: No Has patient had a PCN reaction causing severe rash involving mucus membranes or skin necrosis: No Has patient had a PCN reaction that required hospitalization No Has patient had a PCN reaction occurring within the last 10 years: No If all of the above answers are "NO", then may proceed with Cephalosporin use.      Review of Systems     Objective:   Physical Exam  NAD HEENT: PERRL, EOMI, left conjunctivae quit injected, right mildly so.  Mild erythema of left upper and lower eyelids.  TMs pearly gray.  Nasal mucosa swollen and red with purulent discharge.  NT over maxillary and frontal sinus areas. Throat  without injection or exudate. Neck:  Supple, No adenopathy Chest:  CTA CV:  RRR without murmur or rub.  Radial pulses normal and equal          Assessment & Plan:  Acute sinusitis with what appears to be mild bacterial conjunctivitis:   Good handwashing. Warm compresses to eyes. Azithromycin 500 mg daily of 5 days. To call if worsens or no improvement in 48 hours.

## 2017-04-04 NOTE — Patient Instructions (Addendum)
Call progress report in 2 days.  2 drops baby shampoo in 1 cup warm water and gently wipe crust and discharge from eye.   Wash hands regularly and wipe down things you touch with lysol wipes or something similar

## 2017-05-29 ENCOUNTER — Other Ambulatory Visit: Payer: Self-pay

## 2017-05-29 ENCOUNTER — Emergency Department (HOSPITAL_COMMUNITY)
Admission: EM | Admit: 2017-05-29 | Discharge: 2017-05-29 | Disposition: A | Discharge status: Home / Self Care | Payer: Medicaid Other | Attending: Emergency Medicine | Admitting: Emergency Medicine

## 2017-05-29 DIAGNOSIS — H66001 Acute suppurative otitis media without spontaneous rupture of ear drum, right ear: Secondary | ICD-10-CM | POA: Insufficient documentation

## 2017-05-29 DIAGNOSIS — F17200 Nicotine dependence, unspecified, uncomplicated: Secondary | ICD-10-CM | POA: Insufficient documentation

## 2017-05-29 MED ORDER — ACETAMINOPHEN 500 MG PO TABS: 500.0000 mg | ORAL_TABLET | Freq: Four times a day (QID) | ORAL | 0 refills | Status: DC | PRN

## 2017-05-29 MED ORDER — ERYTHROMYCIN 5 MG/GM OP OINT: TOPICAL_OINTMENT | OPHTHALMIC | 0 refills | Status: DC

## 2017-05-29 MED ORDER — CETIRIZINE-PSEUDOEPHEDRINE ER 5-120 MG PO TB12: 1.0000 | ORAL_TABLET | Freq: Two times a day (BID) | ORAL | 0 refills | Status: DC

## 2017-05-29 MED ORDER — IBUPROFEN 600 MG PO TABS: 600.0000 mg | ORAL_TABLET | Freq: Four times a day (QID) | ORAL | 0 refills | Status: DC | PRN

## 2017-05-29 MED ORDER — AZITHROMYCIN 250 MG PO TABS: 250.0000 mg | ORAL_TABLET | Freq: Every day | ORAL | 0 refills | Status: DC

## 2017-05-29 MED ORDER — FLUORESCEIN SODIUM 1 MG OP STRP
1.0000 | ORAL_STRIP | Freq: Once | OPHTHALMIC | Status: AC
  Administered 2017-05-29: 1 via OPHTHALMIC
  Filled 2017-05-29: qty 1

## 2017-05-29 NOTE — ED Provider Notes (Signed)
[facility] EMERGENCY DEPARTMENT Provider Note   CSN: [id number] Arrival date & time: 05/29/17  0530     History   Chief Complaint Chief Complaint  Patient presents with  . Otalgia    HPI Kristy Nguyen is a 27 y.o. female who presents with a 1 day history of nasal congestion, right ear pain, sore throat, redness and tearing of right eye, and intermittent cough.  Patient reports she had similar symptoms 1 month ago and was given medication at a clinic, but she does not believe she was given any antibiotics.  She does not know exactly what was going on then, she does not think she had an infection.  She denies any fevers.  Patient has been taking ibuprofen at home with some relief.  She denies any chest pain or shortness of breath.  She denies any vision changes.  She reports yesterday, prior to her ear hurting, she had some intermittent dizziness.  HPI  Past Medical History:  Diagnosis Date  . Allergy   . Diabetes mellitus without complication (HCC)    gestational  . Medical history non-contributory     Patient Active Problem List   Diagnosis Date Noted  . Nexplanon insertion 10/28/2016  . NSVD (normal spontaneous vaginal delivery) 09/17/2016  . Oligohydramnios 09/14/2016  . Group B streptococcal infection during pregnancy 09/11/2016  . DM (diabetes mellitus), gestational 08/05/2016  . Supervision of high-risk pregnancy 04/05/2016    Past Surgical History:  Procedure Laterality Date  . NO PAST SURGERIES       OB History    Gravida  2   Para  1   Term  1   Preterm  0   AB  1   Living  1     SAB  1   TAB  0   Ectopic  0   Multiple  0   Live Births  1            Home Medications    Prior to Admission medications   Medication Sig Start Date End Date Taking? Authorizing Provider  acetaminophen (TYLENOL) 500 MG tablet Take 1 tablet (500 mg total) by mouth every 6 (six) hours as needed. 05/29/17   Kiah Keay, Waylan Boga, PA-C    azithromycin (ZITHROMAX) 250 MG tablet Take 1 tablet (250 mg total) by mouth daily. Take first 2 tablets together, then 1 every day until finished. 05/29/17   Audyn Dimercurio, Waylan Boga, PA-C  cetirizine-pseudoephedrine (ZYRTEC-D) 5-120 MG tablet Take 1 tablet by mouth 2 (two) times daily. 05/29/17   Emi Holes, PA-C  erythromycin ophthalmic ointment Place a 1/2 inch ribbon of ointment into the lower eyelid every 6 hours for 5 days. 05/29/17   Loi Rennaker, Waylan Boga, PA-C  ibuprofen (ADVIL,MOTRIN) 600 MG tablet Take 1 tablet (600 mg total) by mouth every 6 (six) hours as needed. 05/29/17   Emi Holes, PA-C    Family History Family History  Problem Relation Age of Onset  . Diabetes Maternal Grandmother   . Cancer Paternal Uncle     Social History Social History   Tobacco Use  . Smoking status: Current Some Day Smoker  . Smokeless tobacco: Former Engineer, water Use Topics  . Alcohol use: No  . Drug use: No     Allergies   Fish allergy and Penicillins   Review of Systems Review of Systems  Constitutional: Negative for chills and fever.  HENT: Positive for congestion, ear pain and sore throat. Negative for  ear discharge and facial swelling.   Eyes: Positive for discharge, redness and itching. Negative for pain and visual disturbance.  Respiratory: Positive for cough. Negative for shortness of breath.   Cardiovascular: Negative for chest pain.  Neurological: Positive for dizziness.  Psychiatric/Behavioral: The patient is not nervous/anxious.      Physical Exam Updated Vital Signs BP 124/79   Pulse 99   Temp 100 F (37.8 C)   Resp 20   Ht 5\' 3"  (1.6 m)   Wt 98 kg (216 lb)   SpO2 95%   BMI 38.26 kg/m   Physical Exam  Constitutional: She appears well-developed and well-nourished. No distress.  HENT:  Head: Normocephalic and atraumatic.  Left Ear: Tympanic membrane normal.  Mouth/Throat: Posterior oropharyngeal edema and posterior oropharyngeal erythema present. No  oropharyngeal exudate or tonsillar abscesses. Tonsils are 2+ on the right. Tonsils are 2+ on the left. No tonsillar exudate.  Right TM erythematous and dull, mildly bulging, preauricular tenderness, no mastoid tenderness  Eyes: Pupils are equal, round, and reactive to light. Conjunctivae and EOM are normal. Right eye exhibits no discharge. Left eye exhibits no discharge. No scleral icterus.  Mild injection of the right conjunctiva No uptake of fluorescein in the right eye No surrounding edema or erythema Visual acuity: Both: 20/20 R: 20/20 L: 20/20  Neck: Normal range of motion. Neck supple. No thyromegaly present.  Cardiovascular: Normal rate, regular rhythm, normal heart sounds and intact distal pulses. Exam reveals no gallop and no friction rub.  No murmur heard. Pulmonary/Chest: Effort normal and breath sounds normal. No stridor. No respiratory distress. She has no wheezes. She has no rales.  Abdominal: Soft. Bowel sounds are normal. She exhibits no distension. There is no tenderness. There is no rebound and no guarding.  Musculoskeletal: She exhibits no edema.  Lymphadenopathy:    She has no cervical adenopathy.  Neurological: She is alert. Coordination normal.  Skin: Skin is warm and dry. No rash noted. She is not diaphoretic. No pallor.  Psychiatric: She has a normal mood and affect.  Nursing note and vitals reviewed.    ED Treatments / Results  Labs (all labs ordered are listed, but only abnormal results are displayed) Labs Reviewed - No data to display  EKG None  Radiology No results found.  Procedures Procedures (including critical care time)  Medications Ordered in ED Medications  fluorescein ophthalmic strip 1 strip (1 strip Right Eye Given 05/29/17 [phone])     Initial Impression / Assessment and Plan / ED Course  I have reviewed the triage vital signs and the nursing notes.  Pertinent labs & imaging results that were available during my care of the patient  were reviewed by me and considered in my medical decision making (see chart for details).     Patient with right otitis media. Patient with temp of 100 in ED. Patient also suspected to have seasonal allergies.  No obvious signs of corneal abrasion, however considering unilateral symptoms of the eye, will cover with erythromycin ointment.  Will treat otitis media with Zithromax, as patient has penicillin allergy.  We will also discharge home with Zyrtec-D and ibuprofen and Tylenol for pain.  Follow-up to clinic if symptoms are not improving over the next week.  Return precautions discussed.  Patient understands and agrees with plan.  Patient vitals stable and discharged in satisfactory condition.  Final Clinical Impressions(s) / ED Diagnoses   Final diagnoses:  Acute suppurative otitis media of right ear without spontaneous rupture of tympanic  membrane, recurrence not specified    ED Discharge Orders        Ordered    cetirizine-pseudoephedrine (ZYRTEC-D) 5-120 MG tablet  2 times daily     05/29/17 0709    erythromycin ophthalmic ointment     05/29/17 0709    ibuprofen (ADVIL,MOTRIN) 600 MG tablet  Every 6 hours PRN     05/29/17 0709    acetaminophen (TYLENOL) 500 MG tablet  Every 6 hours PRN     05/29/17 0709    azithromycin (ZITHROMAX) 250 MG tablet  Daily     05/29/17 0709       Emi HolesLaw, Kazumi Lachney M, PA-C 05/29/17 [id number]    Geoffery Lyonselo, [place], MD 06/01/17 [id number]

## 2017-05-29 NOTE — ED Triage Notes (Signed)
Two days ago patient c/o right ear pain with some eye redness and sore throat.

## 2017-05-29 NOTE — Discharge Instructions (Signed)
Medications: azithromycin, erythromycin ointment, Zyrtec-D, ibuprofen, Tylenol  Treatment: Take azithromycin for 5 days until completed.  Apply erythromycin ointment to your right eye hours for 5 days.  Take Zyrtec-D twice daily for nasal congestion and allergy symptoms.  Alternate ibuprofen and Tylenol every 3 hours or take 1 every 6 hours for your pain.  Follow-up: Please follow-up with your doctor or the clinic if your symptoms are not improving over the next week.  Return to the emergency department if you develop any new or worsening symptoms.

## 2017-06-08 ENCOUNTER — Ambulatory Visit: Payer: Self-pay | Admitting: Internal Medicine

## 2017-06-15 ENCOUNTER — Ambulatory Visit: Payer: Self-pay | Admitting: Internal Medicine

## 2017-07-20 ENCOUNTER — Ambulatory Visit: Payer: Self-pay | Admitting: Internal Medicine

## 2018-06-12 IMAGING — US US OB COMP LESS 14 WK
1 series · 15 of 22 positions shown · non-contrast
Comparison: None.

CLINICAL DATA: Intermittent abdominal cramping and spotting since
02/24/2016.

EXAM:
OBSTETRIC <14 WK ULTRASOUND
TECHNIQUE: Transabdominal ultrasound was performed for evaluation of the
gestation as well as the maternal uterus and adnexal regions.

[Series 1: us ob comp less 14 wk · 15 of 22 slices shown]
[im 1/22]
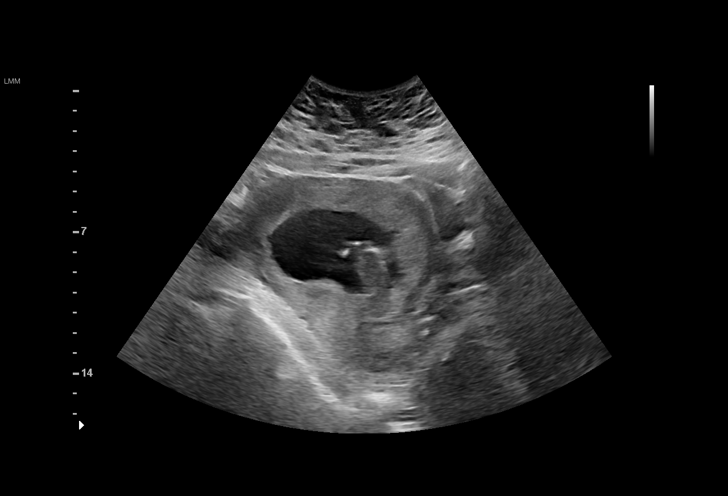
[im 3/22]
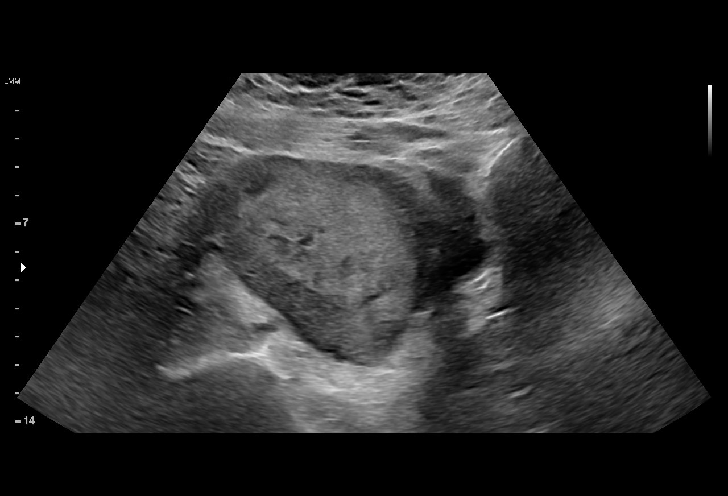
[im 4/22]
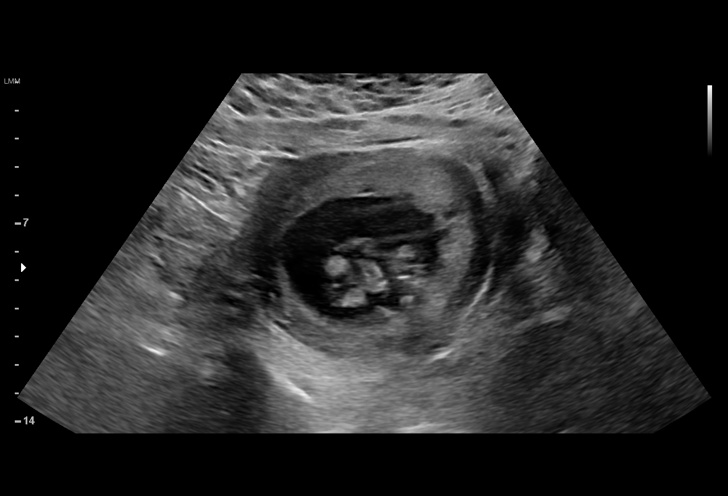
[im 6/22]
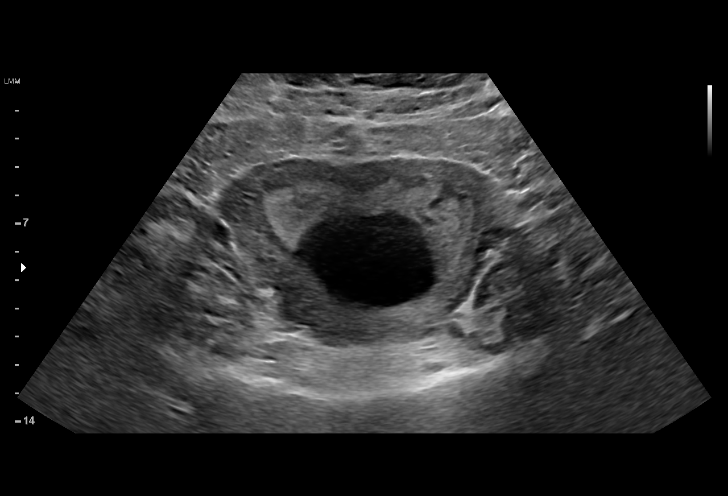
[im 7/22]
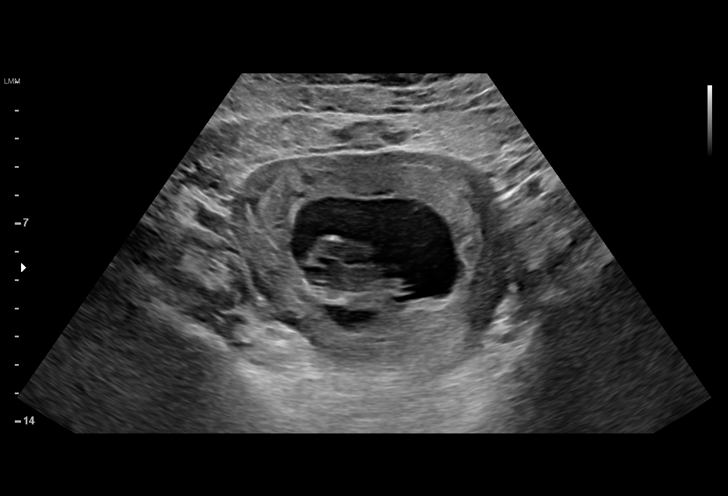
[im 9/22]
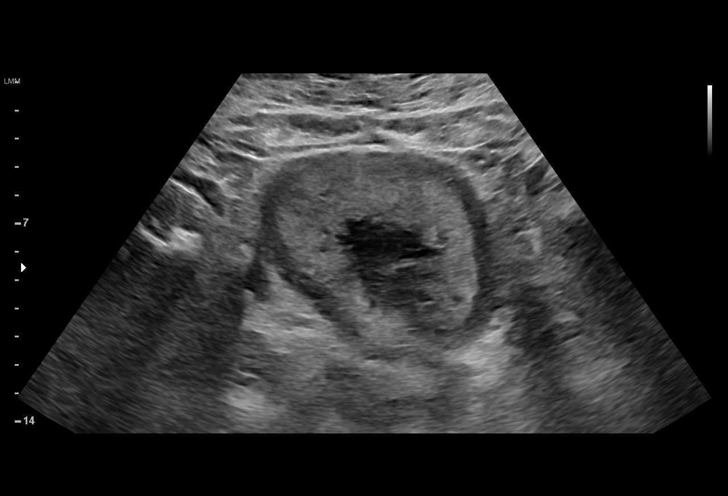
[im 10/22]
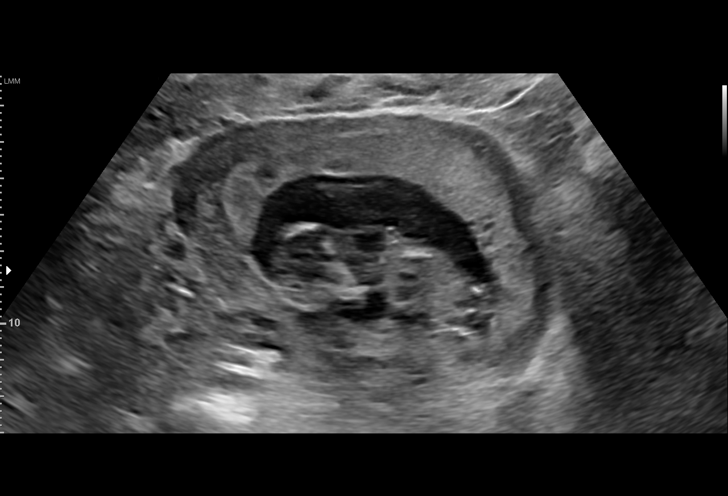
[im 12/22]
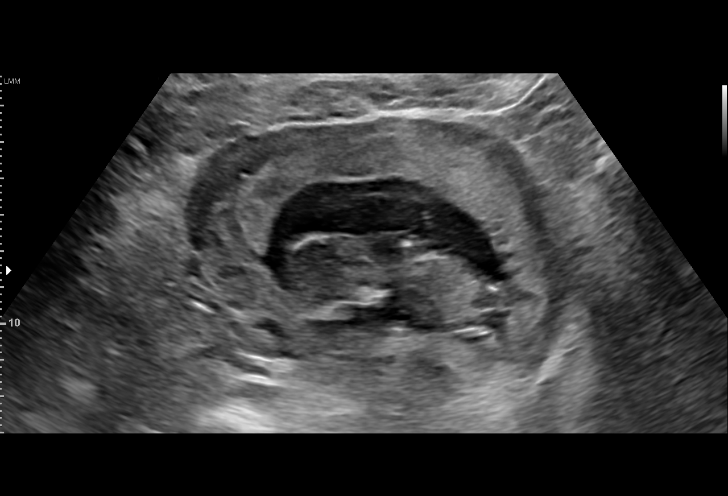
[im 13/22]
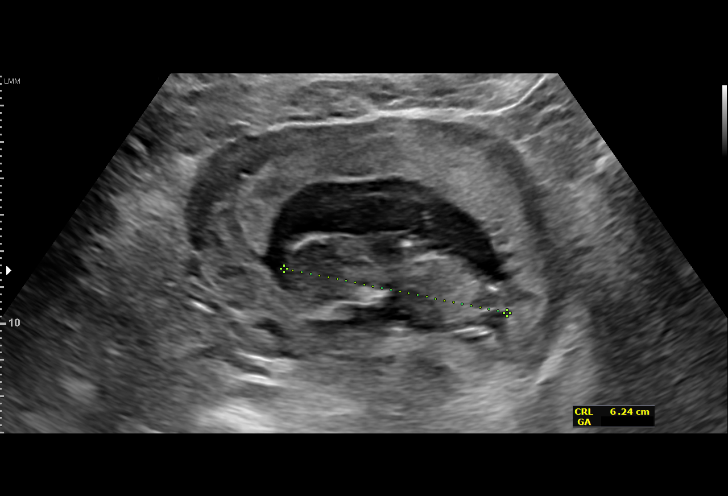
[im 14/22]
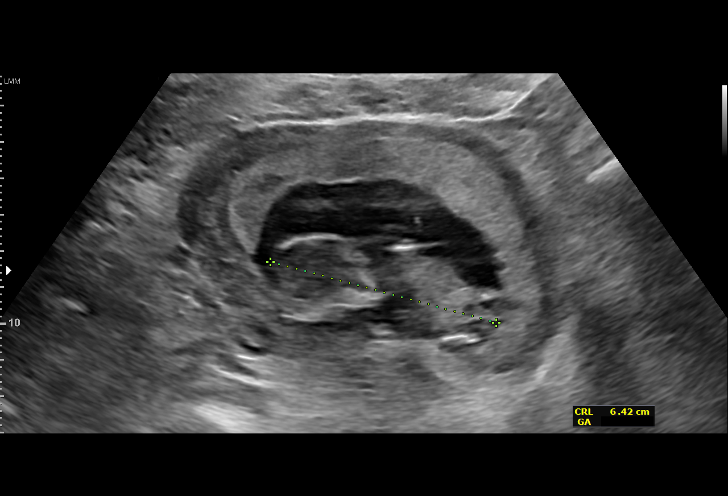
[im 16/22]
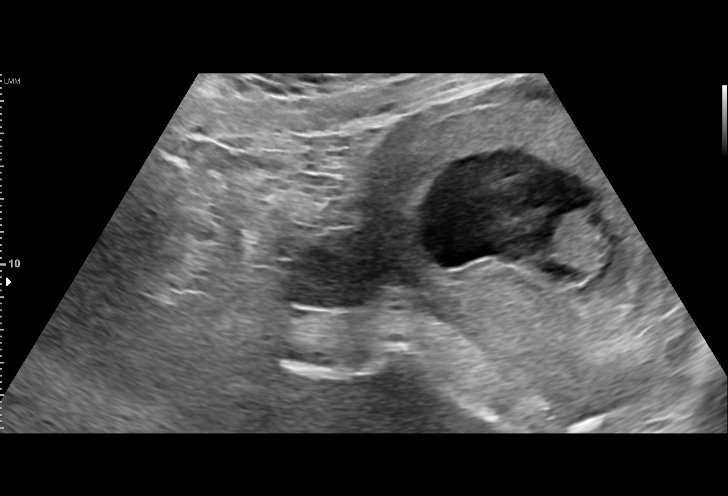
[im 17/22]
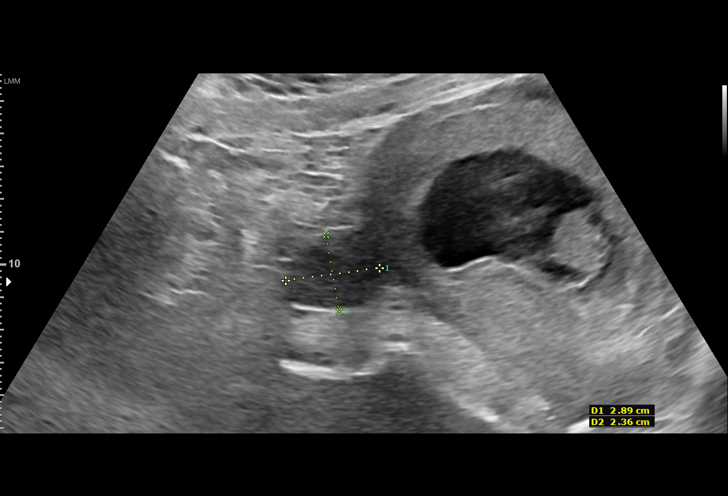
[im 19/22]
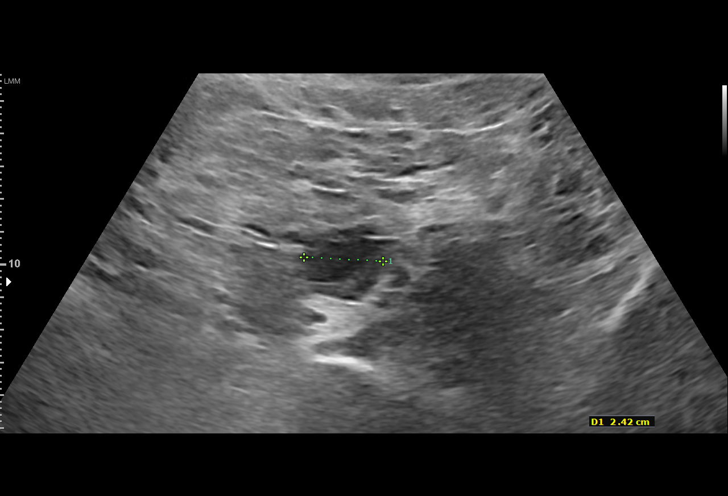
[im 20/22]
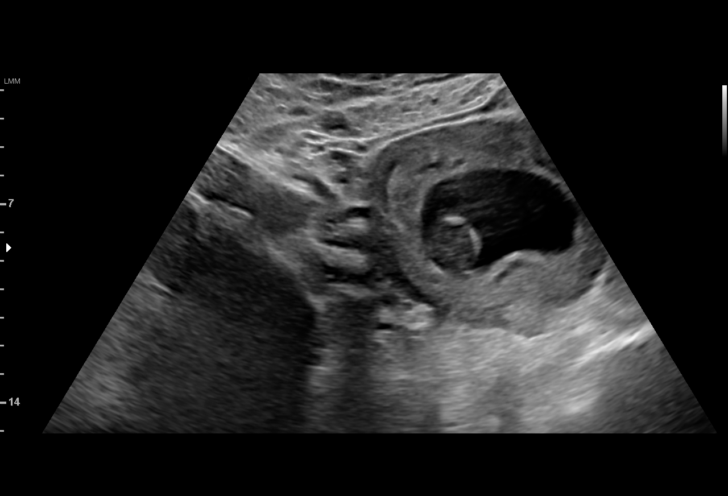
[im 22/22]
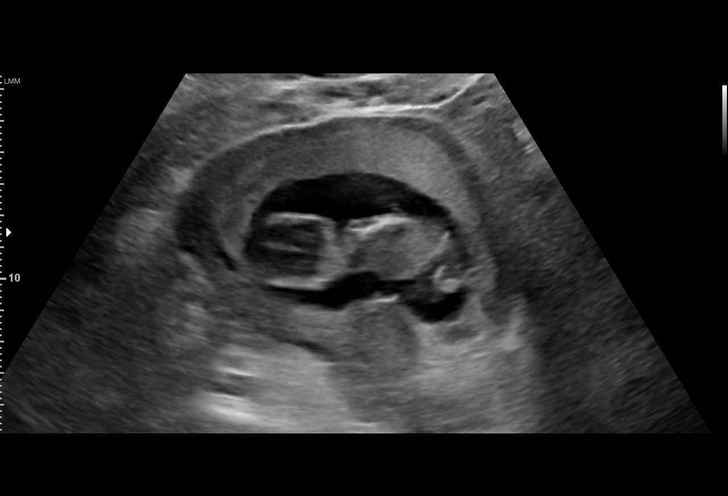

[15 of 22 positions shown; findings below may reference images not displayed]

FINDINGS: Intrauterine gestational sac: Single

Yolk sac:  Not Visualized.

Embryo:  Visualized.

Cardiac Activity: Visualized.

Heart Rate: 159 bpm

CRL:   63  mm   12 w 4d                  US EDC: 10/04/2016

Subchorionic hemorrhage:  None visualized.

Maternal uterus/adnexae: Nonvisualized right ovary. Normal left
ovary. No adnexal mass. No pelvic free fluid.
IMPRESSION: Single live intrauterine pregnancy as detailed above.

## 2018-08-20 ENCOUNTER — Other Ambulatory Visit: Payer: Self-pay

## 2018-08-21 ENCOUNTER — Ambulatory Visit (INDEPENDENT_AMBULATORY_CARE_PROVIDER_SITE_OTHER): Payer: BC Managed Care – PPO | Admitting: Gynecology

## 2018-08-21 ENCOUNTER — Encounter: Payer: Self-pay | Admitting: Gynecology

## 2018-08-21 VITALS — BP 122/78 | Ht 59.0 in | Wt 209.0 lb

## 2018-08-21 DIAGNOSIS — Z113 Encounter for screening for infections with a predominantly sexual mode of transmission: Secondary | ICD-10-CM | POA: Diagnosis not present

## 2018-08-21 DIAGNOSIS — Z308 Encounter for other contraceptive management: Secondary | ICD-10-CM | POA: Diagnosis not present

## 2018-08-21 DIAGNOSIS — Z01419 Encounter for gynecological examination (general) (routine) without abnormal findings: Secondary | ICD-10-CM

## 2018-08-21 DIAGNOSIS — N926 Irregular menstruation, unspecified: Secondary | ICD-10-CM

## 2018-08-21 NOTE — Patient Instructions (Signed)
Follow-up for Nexplanon removal as arranged

## 2018-08-21 NOTE — Progress Notes (Addendum)
Kristy LombardFatima Nguyen [birth year] [id number]        28 y.o.  G2P1011 new patient for annual gynecologic exam.  Also complaining of irregular bleeding.  Patient had Nexplanon placed 11/2016.  Was without menses until 03/2018 when she started bleeding heavily.  Was treated with progesterone which resolved her bleeding.  She then started bleeding again several weeks ago and has bled on and off since then.  Has also noticed headaches and weight gain since the Nexplanon was placed.  Recently separated from her partner when she found he was unfaithful with another man.    Past medical history,surgical history, problem list, medications, allergies, family history and social history were all reviewed and documented as reviewed in the EPIC chart.  ROS:  Performed with pertinent positives and negatives included in the history, assessment and plan.   Additional significant findings : None   Exam: Kennon PortelaKim Gardner assistant along with [place] provided interpreter present throughout the entire exam process Vitals:   08/21/18 1538  Weight: 209 lb (94.8 kg)  Height: 4\' 11"  (1.499 m)  BP 122/78 Body mass index is 42.21 kg/m.  General appearance:  Normal affect, orientation and appearance. Skin: Grossly normal.  Nexplanon rod palpated upper inner right arm. HEENT: Without gross lesions.  No cervical or supraclavicular adenopathy. Thyroid normal.  Lungs:  Clear without wheezing, rales or rhonchi Cardiac: RR, without RMG Abdominal:  Soft, nontender, without masses, guarding, rebound, organomegaly or hernia Breasts:  Examined lying and sitting without masses, retractions, discharge or axillary adenopathy. Pelvic:  Ext, BUS, Vagina: Normal  Cervix: Normal.  Pap smear, GC/chlamydia  Uterus: Anteverted, normal size, shape and contour, midline and mobile nontender   Adnexa: Without masses or tenderness    Anus and perineum: Normal     Assessment/Plan:  28 y.o. 282P1011 female for annual gynecologic exam.   With irregular bleeding, Nexplanon birth control  1. Nexplanon.  Patient has been having headaches and weight gain since placement.  Now is having some irregular bleeding.  We reviewed whether her irregular bleeding was due to the Nexplanon which is a common side effect or whether other pathology exists such as polyps or abnormal tissue growth as well as other hormonal abnormalities.  She did have irregular menses before her pregnancy and placement of the Nexplanon.  Options to include ultrasound now for pelvic surveillance and endometrial echo assessment versus removing her Nexplanon given the other side effects and considering alternative birth control and then monitoring her cycle and pursuing a more involved evaluation if irregular bleeding continues discussed.  Managing her bleeding now with hormonal manipulation such as progesterone intermittently and keeping the Nexplanon was discussed.  The patient strongly wants to have her Nexplanon removed.  She will schedule an appointment for this.  We will then follow her bleeding pattern afterwards and assuming it regulates then she would like to proceed with a Mirena IUD that we discussed also as alternative birth control today.  I discussed the insertional process and the risks and she would like to go ahead and schedule this although I would recommend waiting and seeing how her bleeding pattern does for several months using backup contraception if she does become sexually active.  Will check baseline CBC and TSH due to the irregular bleeding. 2. STD screening.  Reviewed options for STD screening and she would like to be screened.  As her ex-partner was involved with other men we discussed serum screening to include HIV hepatitis B hepatitis C and RPR along  with her GC/chlamydia screening.  She did note that he had condyloma on his penis.  Her exam today shows no evidence of condyloma.  We discussed HPV exposure and risks and she will report any abnormalities on  self exam.  We did a Pap smear today.  We discussed HPV screening but I think at this point we will hold as a negative screen does not mean that she has not been exposed HPV and as long as her cytology is normal will follow expectantly for now. 3. Breast health.  Breast exam normal today.  SBE monthly reviewed. 4. Health maintenance.  No routine lab work drawn as patient has a primary provider.  Follow-up for Nexplanon removal and follow-up monitoring of her cycles/Mirena IUD placement as we discussed today.  Additional time in excess of her routine gynecologic exam was spent in direct face to face counseling and coordination of care in regards to her irregular bleeding and contraceptive option review.    Anastasio Auerbach MD, 4:32 PM 08/21/2018

## 2018-08-21 NOTE — Addendum Note (Signed)
Addended by: Nelva Nay on: 08/21/2018 04:45 PM   Modules accepted: Orders

## 2018-08-22 LAB — PAP IG W/ RFLX HPV ASCU

## 2018-08-22 LAB — HEPATITIS B SURFACE ANTIGEN: Hepatitis B Surface Ag: NONREACTIVE

## 2018-08-22 LAB — CBC WITH DIFFERENTIAL/PLATELET
Absolute Monocytes: 697 cells/uL (ref 200–950)
Basophils Absolute: 94 cells/uL (ref 0–200)
Basophils Relative: 0.9 %
Eosinophils Absolute: 250 cells/uL (ref 15–500)
Eosinophils Relative: 2.4 %
HCT: 40.9 % (ref 35.0–45.0)
Hemoglobin: 13.5 g/dL (ref 11.7–15.5)
Lymphs Abs: 2870 cells/uL (ref 850–3900)
MCH: 29.6 pg (ref 27.0–33.0)
MCHC: 33 g/dL (ref 32.0–36.0)
MCV: 89.7 fL (ref 80.0–100.0)
MPV: 12.4 fL (ref 7.5–12.5)
Monocytes Relative: 6.7 %
Neutro Abs: 6490 cells/uL (ref 1500–7800)
Neutrophils Relative %: 62.4 %
Platelets: 349 10*3/uL (ref 140–400)
RBC: 4.56 10*6/uL (ref 3.80–5.10)
RDW: 11.7 % (ref 11.0–15.0)
Total Lymphocyte: 27.6 %
WBC: 10.4 10*3/uL (ref 3.8–10.8)

## 2018-08-22 LAB — TSH: TSH: 2.08 mIU/L

## 2018-08-22 LAB — RPR: RPR Ser Ql: NONREACTIVE

## 2018-08-22 LAB — C. TRACHOMATIS/N. GONORRHOEAE RNA
C. trachomatis RNA, TMA: NOT DETECTED
N. gonorrhoeae RNA, TMA: NOT DETECTED

## 2018-08-22 LAB — HIV ANTIBODY (ROUTINE TESTING W REFLEX): HIV 1&2 Ab, 4th Generation: NONREACTIVE

## 2018-08-22 LAB — HEPATITIS C ANTIBODY
Hepatitis C Ab: NONREACTIVE
SIGNAL TO CUT-OFF: 0.02 (ref <1.00)

## 2018-08-23 ENCOUNTER — Other Ambulatory Visit: Payer: Self-pay | Admitting: Anesthesiology

## 2018-08-23 MED ORDER — METRONIDAZOLE 500 MG PO TABS: 500.0000 mg | ORAL_TABLET | Freq: Two times a day (BID) | ORAL | 0 refills | Status: DC

## 2018-08-30 ENCOUNTER — Other Ambulatory Visit: Payer: Self-pay

## 2018-08-31 ENCOUNTER — Encounter: Payer: Self-pay | Admitting: Gynecology

## 2018-08-31 ENCOUNTER — Ambulatory Visit (INDEPENDENT_AMBULATORY_CARE_PROVIDER_SITE_OTHER): Payer: BC Managed Care – PPO | Admitting: Gynecology

## 2018-08-31 VITALS — BP 124/78

## 2018-08-31 DIAGNOSIS — Z3046 Encounter for surveillance of implantable subdermal contraceptive: Secondary | ICD-10-CM

## 2018-08-31 DIAGNOSIS — K130 Diseases of lips: Secondary | ICD-10-CM

## 2018-08-31 MED ORDER — NORETHINDRONE ACET-ETHINYL EST 1-20 MG-MCG PO TABS: 1.0000 | ORAL_TABLET | Freq: Every day | ORAL | 11 refills | Status: DC

## 2018-08-31 NOTE — Patient Instructions (Signed)
Apply Neosporin antibiotic cream that you can buy over-the-counter to the area on your lip 4 times daily.  Follow-up if this area does not heal  Start on the birth control pills as we discussed.

## 2018-08-31 NOTE — Progress Notes (Signed)
    Kristy Nguyen 02/22/1990 [id number]        28 y.o.  G2P1011 presents for Nexplanon removal.  Also complaining of a sore on her lip over the last several days.  We had discussed Mirena IUD in the past but she would prefer to start on low-dose oral contraceptives.  She presents with a [place] provided interpreter  Past medical history,surgical history, problem list, medications, allergies, family history and social history were all reviewed and documented in the EPIC chart.  Directed ROS with pertinent positives and negatives documented in the history of present illness/assessment and plan.  Exam: Caryn Bee assistant Vitals:   08/31/18 1545  BP: 124/78   General appearance:  Normal HEENT with small scabbed lesion right upper lip.  Procedure:  The removal process was reviewed with the patient and the potential risks were discussed to include bleeding, infection, damage to underlying neurovascular structures and the possibility that we may not be able to remove the rod or incomplete removal.  The right upper, inner arm was examined with the Nexplanon rod clearly palpated. The skin overlying the distal portion of the rod was cleansed with Betadine and infiltrated with 1% lidocaine. A small skin incision was made with a scalpel over the distal tip of the Nexplanon rod. The Nexplanon tip was subsequently delivered through the incision using blunt and sharp dissection and the tip was grasped with a forcep and the Nexplanon rod was removed intact, shown to the patient and discarded.  A Steri-Strip was used to close the small skin defect and a pressure dressing was applied with postoperative instructions given.  Assessment/Plan:  28 y.o. G2P1011 with:  1. Successful removal of her Nexplanon.  She would like to start on low-dose birth control pills which she has used in the past.  Loestrin 1/20 equivalent provided.  Recommended started now with backup contraception first pack.  Follow-up  if irregular bleeding or any other issues. 2. Pap smear showed bacterial changes suggesting BV.  We had called and prescribed Flagyl twice daily x7 days.  The patient has not started this yet and I encouraged her to go ahead and start and take this. 3. 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  Discussed HSV as well as bacterial possibility.  No history of similar outbreaks before.  As she is several days into it I recommended Neosporin ointment 4 times daily for now.  Assuming it resolves if she has any recurrence to follow-up and consider Valtrex suppression.  If worsens will follow-up.   Anastasio Auerbach MD, 4:32 PM 08/31/2018

## 2018-10-31 DIAGNOSIS — Z20828 Contact with and (suspected) exposure to other viral communicable diseases: Secondary | ICD-10-CM | POA: Diagnosis not present

## 2018-11-05 ENCOUNTER — Encounter: Payer: Self-pay | Admitting: Gynecology

## 2018-12-14 DIAGNOSIS — Z23 Encounter for immunization: Secondary | ICD-10-CM | POA: Diagnosis not present

## 2019-04-12 ENCOUNTER — Telehealth: Payer: Self-pay | Admitting: *Deleted

## 2019-04-12 MED ORDER — NORETHINDRONE ACET-ETHINYL EST 1-20 MG-MCG PO TABS: 1.0000 | ORAL_TABLET | Freq: Every day | ORAL | 4 refills | Status: DC

## 2019-04-12 NOTE — Telephone Encounter (Signed)
Spanish speaking patient walked into the office and spoke with Debarah Crape stating [facility] didn't have her Rx for Loestrin 1/20 mcg that was prescribed on 08/31/18. I will resend Rx,patient never picked up Rx.

## 2019-05-23 DIAGNOSIS — Z20828 Contact with and (suspected) exposure to other viral communicable diseases: Secondary | ICD-10-CM | POA: Diagnosis not present

## 2019-06-05 DIAGNOSIS — T192XXA Foreign body in vulva and vagina, initial encounter: Secondary | ICD-10-CM | POA: Diagnosis not present

## 2019-06-05 DIAGNOSIS — N760 Acute vaginitis: Secondary | ICD-10-CM | POA: Diagnosis not present

## 2020-02-06 ENCOUNTER — Other Ambulatory Visit: Payer: Self-pay

## 2020-02-06 ENCOUNTER — Emergency Department (HOSPITAL_COMMUNITY)
Admission: EM | Admit: 2020-02-06 | Discharge: 2020-02-06 | Disposition: A | Discharge status: Home / Self Care | Payer: BC Managed Care – PPO | Attending: Emergency Medicine | Admitting: Emergency Medicine

## 2020-02-06 DIAGNOSIS — R1030 Lower abdominal pain, unspecified: Secondary | ICD-10-CM | POA: Insufficient documentation

## 2020-02-06 DIAGNOSIS — Z5321 Procedure and treatment not carried out due to patient leaving prior to being seen by health care provider: Secondary | ICD-10-CM | POA: Insufficient documentation

## 2020-02-06 LAB — CBC
HCT: 40.1 % (ref 36.0–46.0)
Hemoglobin: 13.2 g/dL (ref 12.0–15.0)
MCH: 30.3 pg (ref 26.0–34.0)
MCHC: 32.9 g/dL (ref 30.0–36.0)
MCV: 92 fL (ref 80.0–100.0)
Platelets: 342 10*3/uL (ref 150–400)
RBC: 4.36 MIL/uL (ref 3.87–5.11)
RDW: 12.4 % (ref 11.5–15.5)
WBC: 8.5 10*3/uL (ref 4.0–10.5)
nRBC: 0 % (ref 0.0–0.2)

## 2020-02-06 LAB — COMPREHENSIVE METABOLIC PANEL
ALT: 20 U/L (ref 0–44)
AST: 21 U/L (ref 15–41)
Albumin: 4.1 g/dL (ref 3.5–5.0)
Alkaline Phosphatase: 80 U/L (ref 38–126)
Anion gap: 10 (ref 5–15)
BUN: 9 mg/dL (ref 6–20)
CO2: 26 mmol/L (ref 22–32)
Calcium: 9.8 mg/dL (ref 8.9–10.3)
Chloride: 103 mmol/L (ref 98–111)
Creatinine, Ser: 0.69 mg/dL (ref 0.44–1.00)
GFR, Estimated: >60 mL/min (ref >60)
Glucose, Bld: 104 mg/dL — ABNORMAL HIGH (ref 70–99)
Potassium: 3.8 mmol/L (ref 3.5–5.1)
Sodium: 139 mmol/L (ref 135–145)
Total Bilirubin: 0.3 mg/dL (ref 0.3–1.2)
Total Protein: 7.5 g/dL (ref 6.5–8.1)

## 2020-02-06 LAB — URINALYSIS, ROUTINE W REFLEX MICROSCOPIC
Bilirubin Urine: NEGATIVE
Glucose, UA: NEGATIVE mg/dL
Hgb urine dipstick: NEGATIVE
Ketones, ur: NEGATIVE mg/dL
Leukocytes,Ua: NEGATIVE
Nitrite: NEGATIVE
Protein, ur: NEGATIVE mg/dL
Specific Gravity, Urine: 1.017 (ref 1.005–1.030)
pH: 7 (ref 5.0–8.0)

## 2020-02-06 LAB — I-STAT BETA HCG BLOOD, ED (MC, WL, AP ONLY): I-stat hCG, quantitative: <5 m[IU]/mL (ref <5)

## 2020-02-06 LAB — LIPASE, BLOOD: Lipase: 21 U/L (ref 11–51)

## 2020-02-06 NOTE — ED Triage Notes (Signed)
Pt presents to ED POV. Pt c/o lower abd cramping and possible pregnancy. LMP - 12/30/2018 G2P1

## 2020-02-06 NOTE — ED Notes (Signed)
Called pt for rooming and no answer

## 2020-02-24 ENCOUNTER — Other Ambulatory Visit: Payer: Self-pay

## 2020-02-24 ENCOUNTER — Emergency Department (HOSPITAL_COMMUNITY)
Admission: EM | Admit: 2020-02-24 | Discharge: 2020-02-24 | Disposition: A | Discharge status: Home / Self Care | Payer: BC Managed Care – PPO | Attending: Emergency Medicine | Admitting: Emergency Medicine

## 2020-02-24 ENCOUNTER — Encounter (HOSPITAL_COMMUNITY): Payer: Self-pay | Admitting: Emergency Medicine

## 2020-02-24 DIAGNOSIS — R102 Pelvic and perineal pain: Secondary | ICD-10-CM | POA: Diagnosis not present

## 2020-02-24 DIAGNOSIS — Z5321 Procedure and treatment not carried out due to patient leaving prior to being seen by health care provider: Secondary | ICD-10-CM | POA: Diagnosis not present

## 2020-02-24 DIAGNOSIS — R110 Nausea: Secondary | ICD-10-CM | POA: Diagnosis not present

## 2020-02-24 DIAGNOSIS — R1030 Lower abdominal pain, unspecified: Secondary | ICD-10-CM | POA: Diagnosis not present

## 2020-02-24 LAB — CBC
HCT: 44 % (ref 36.0–46.0)
Hemoglobin: 13.8 g/dL (ref 12.0–15.0)
MCH: 29.4 pg (ref 26.0–34.0)
MCHC: 31.4 g/dL (ref 30.0–36.0)
MCV: 93.8 fL (ref 80.0–100.0)
Platelets: 309 10*3/uL (ref 150–400)
RBC: 4.69 MIL/uL (ref 3.87–5.11)
RDW: 12.2 % (ref 11.5–15.5)
WBC: 9.8 10*3/uL (ref 4.0–10.5)
nRBC: 0 % (ref 0.0–0.2)

## 2020-02-24 LAB — I-STAT BETA HCG BLOOD, ED (MC, WL, AP ONLY): I-stat hCG, quantitative: <5 m[IU]/mL (ref <5)

## 2020-02-24 LAB — COMPREHENSIVE METABOLIC PANEL
ALT: 19 U/L (ref 0–44)
AST: 23 U/L (ref 15–41)
Albumin: 3.9 g/dL (ref 3.5–5.0)
Alkaline Phosphatase: 93 U/L (ref 38–126)
Anion gap: 12 (ref 5–15)
BUN: 11 mg/dL (ref 6–20)
CO2: 23 mmol/L (ref 22–32)
Calcium: 9.7 mg/dL (ref 8.9–10.3)
Chloride: 102 mmol/L (ref 98–111)
Creatinine, Ser: 0.64 mg/dL (ref 0.44–1.00)
GFR, Estimated: >60 mL/min (ref >60)
Glucose, Bld: 116 mg/dL — ABNORMAL HIGH (ref 70–99)
Potassium: 4 mmol/L (ref 3.5–5.1)
Sodium: 137 mmol/L (ref 135–145)
Total Bilirubin: 0.4 mg/dL (ref 0.3–1.2)
Total Protein: 7.6 g/dL (ref 6.5–8.1)

## 2020-02-24 LAB — LIPASE, BLOOD: Lipase: 25 U/L (ref 11–51)

## 2020-02-24 NOTE — ED Triage Notes (Signed)
Pt reports lower abd/pelvic cramping x 1 month.  States she thinks she may be pregnant.  Reports nausea.  Denies vomiting, diarrhea, and urinary complaints.

## 2020-02-24 NOTE — ED Notes (Signed)
Pt not answering fo vital recheck

## 2020-02-24 NOTE — ED Notes (Signed)
Pt not responding to vital recheck  

## 2020-02-25 ENCOUNTER — Ambulatory Visit (INDEPENDENT_AMBULATORY_CARE_PROVIDER_SITE_OTHER): Payer: BC Managed Care – PPO | Admitting: Nurse Practitioner

## 2020-02-25 ENCOUNTER — Encounter: Payer: Self-pay | Admitting: Nurse Practitioner

## 2020-02-25 VITALS — BP 114/72 | HR 68 | Temp 98.6°F | Resp 16 | Ht 60.0 in | Wt 198.0 lb

## 2020-02-25 DIAGNOSIS — Z8632 Personal history of gestational diabetes: Secondary | ICD-10-CM | POA: Diagnosis not present

## 2020-02-25 DIAGNOSIS — R109 Unspecified abdominal pain: Secondary | ICD-10-CM

## 2020-02-25 DIAGNOSIS — Z01419 Encounter for gynecological examination (general) (routine) without abnormal findings: Secondary | ICD-10-CM

## 2020-02-25 DIAGNOSIS — R102 Pelvic and perineal pain: Secondary | ICD-10-CM

## 2020-02-25 DIAGNOSIS — Z202 Contact with and (suspected) exposure to infections with a predominantly sexual mode of transmission: Secondary | ICD-10-CM | POA: Diagnosis not present

## 2020-02-25 DIAGNOSIS — Z113 Encounter for screening for infections with a predominantly sexual mode of transmission: Secondary | ICD-10-CM

## 2020-02-25 DIAGNOSIS — R739 Hyperglycemia, unspecified: Secondary | ICD-10-CM

## 2020-02-25 LAB — WET PREP FOR TRICH, YEAST, CLUE

## 2020-02-25 MED ORDER — CEFTRIAXONE SODIUM 500 MG IJ SOLR
500.0000 mg | Freq: Once | INTRAMUSCULAR | Status: AC
  Administered 2020-02-25: 500 mg via INTRAMUSCULAR

## 2020-02-25 MED ORDER — DOXYCYCLINE HYCLATE 100 MG PO CAPS: 100.0000 mg | ORAL_CAPSULE | Freq: Two times a day (BID) | ORAL | 0 refills | Status: AC

## 2020-02-25 NOTE — Progress Notes (Signed)
30 y.o. [id number] Single Other or two or more races female here for annual exam.      Here for annual exam and evaluation of abdominal pain. Has been going on x 1 month. Has abdominal cramps and nausea. Went to ER twice, left with out finishing appointment r/t ten hour wait. (02/06/2020 and 02/25/2020) Some blood work done prior to leaving (results on chart)  Denies vaginal irritation, itching or burning. Sometimes has burning with urination x 1 month. (did not give urine specimen today). States periods are regular, no break through bleeding. Last period more "crampy"  Has been with new sexual partner x 5 months.Does not use condoms or birth control. He told her 4 days ago that he had an infection and she should get checked out. He went to doctor and found out he had gonorrhea (She texted him while here in the room and he gave her this information) He told her via text that he was treated with a "shot" and Doxycycline.  Wants to have a baby next year. "Has low fertility, took 8 years of trying to get pregnant with last child" (2018)  Patient's last menstrual period was 02/05/2020 (exact date).         (Pregnancy Test negative in ER 02/24/2020) Sexually active: Yes.    The current method of family planning is none.    Exercising: Yes.    boxing Smoker:  no  Health Maintenance: Pap:  08-21-2018 neg History of abnormal Pap:  no MMG:  none Colonoscopy:  none BMD:   none TDaP:  2018 Gardasil:   Not done Covid-19: done Hep C testing: hep c neg 2020 Screening Labs:will test a1c today (Hx GDM and elevated blood sugar in ER yesterday, states was fasting)   reports that she has been smoking. She has quit using smokeless tobacco. She reports that she does not drink alcohol and does not use drugs.  Past Medical History:  Diagnosis Date  . Allergy   . Diabetes mellitus without complication (HCC)    gestational  . Medical history non-contributory     Past Surgical History:  Procedure Laterality  Date  . NO PAST SURGERIES      Current Outpatient Medications  Medication Sig Dispense Refill  . cetirizine-pseudoephedrine (ZYRTEC-D) 5-120 MG tablet Take 1 tablet by mouth 2 (two) times daily. 30 tablet 0  . doxycycline (VIBRAMYCIN) 100 MG capsule Take 1 capsule (100 mg total) by mouth 2 (two) times daily for 7 days. Take BID for 14 days.  Take with food as can cause GI distress. 14 capsule 0  . ibuprofen (ADVIL,MOTRIN) 600 MG tablet Take 1 tablet (600 mg total) by mouth every 6 (six) hours as needed. 30 tablet 0   Current Facility-Administered Medications  Medication Dose Route Frequency Provider Last Rate Last Admin  . cefTRIAXone (ROCEPHIN) injection 500 mg  500 mg Intramuscular Once Clarita Crane, NP        Family History  Problem Relation Age of Onset  . Diabetes Maternal Grandmother   . Cancer Paternal Uncle        Brain    Review of Systems  Exam:   BP 114/72   Pulse 68   Resp 16   Ht 5' (1.524 m)   Wt 198 lb (89.8 kg)   LMP 02/05/2020 (Exact Date)   BMI 38.67 kg/m   Height: 5' (152.4 cm) Temp: 98.6  General appearance: alert, cooperative and appears stated age, no acute distress Head: Normocephalic, without obvious abnormality Neck:  no adenopathy, thyroid normal to inspection and palpation Lungs: clear to auscultation bilaterally Breasts: Normal to palpation without dominant masses Heart: regular rate and rhythm Abdomen: soft, no  masses,  no organomegaly, mildly tender suprapubic area Extremities: extremities normal, no edema Skin: No rashes or lesions Lymph nodes: Cervical, supraclavicular, and axillary nodes normal. No abnormal inguinal nodes palpated Neurologic: Grossly normal   Pelvic: External genitalia:  no lesions              Urethra:  normal appearing urethra with no masses, tenderness or lesions              Bartholins and Skenes: normal                 Vagina: normal appearing vagina, appropriate for age, moderate amount of white discharge, no  lesions              Cervix: mild cervical motion tenderness, no visible lesions             Bimanual Exam:   Uterus: normal size and shape, tender              Adnexa: no palpable mass, tender bilaterally (mild)  Joy, CMA Chaperone was present for exam.  A:   Well woman exam  Hyperglycemia - Plan: Hemoglobin A1c  Screen for STD (sexually transmitted disease) - Plan: HIV Antibody (routine testing w rflx), RPR, WET PREP FOR TRICH, YEAST, CLUE  Abdominal cramps  Exposure to gonorrhea - Plan: cefTRIAXone (ROCEPHIN) injection 500 mg, doxycycline (VIBRAMYCIN) 100 MG capsule, WET PREP FOR TRICH, YEAST, CLUE, C. trachomatis/N. gonorrhoeae RNA  Pelvic pain   P:   Pap : due 2023  Labs: reviewed from ER yesterday. Normal WBC count, elevated blood sugar (pt stated fasting) Hx GDM, A1c  RPR, HIV (drawn today)  Medications: Rocephin 500mg  given in office today, Rx Doxycycline sent to pharmacy  Briefly discussed birth control methods but pt unsure, wanted more information about IUD but wants pregnancy soon, will discuss further at follow up appointment.  F/U 2 weeks to re-evaluate abdominal pain/pelvic pain, discuss birth control

## 2020-02-25 NOTE — Patient Instructions (Signed)
Mantenimiento de Radiographer, therapeutic en las mujeres Health Maintenance, Female Adoptar un estilo de vida saludable y recibir atencin preventiva son importantes para promover la salud y Counsellor. Consulte al mdico sobre:  El esquema adecuado para hacerse pruebas y exmenes peridicos.  Cosas que puede hacer por su cuenta para prevenir enfermedades y [employer]. Qu debo saber sobre la dieta, el peso y el ejercicio? Consuma una dieta saludable  Consuma una dieta que incluya muchas verduras, frutas, productos lcteos con bajo contenido de [place] y Associate Professor.  No consuma muchos alimentos ricos en grasas slidas, azcares agregados o sodio.   Mantenga un peso saludable El ndice de masa muscular [facility]) se [place] para identificar problemas de [place]. Proporciona una estimacin de la grasa corporal basndose en el peso y la altura. Su mdico puede ayudarle a Engineer, site IMC y a Personnel officer o Pharmacologist un peso saludable. Haga ejercicio con regularidad Haga ejercicio con regularidad. Esta es una de las prcticas ms importantes que puede hacer por su salud. La mayora de los adultos deben seguir estas pautas:  Education officer, environmental, al menos, de actividad fsica por semana. El ejercicio debe aumentar la frecuencia cardaca y Media planner transpirar (ejercicio de intensidad moderada).  Hacer ejercicios de fortalecimiento por lo [employer] por semana. Agregue esto a su plan de ejercicio de intensidad moderada.  Pasar menos tiempo sentados. Incluso la actividad fsica ligera puede ser beneficiosa. Controle sus niveles de colesterol y lpidos en la sangre Comience a realizarse anlisis de lpidos y Oncologist en la sangre a los 20aos y luego reptalos cada 5aos. Hgase controlar los niveles de colesterol con mayor frecuencia si:  Sus niveles de lpidos y colesterol son altos.  Es mayor de 40aos.  Presenta un alto riesgo de padecer enfermedades cardacas. Qu debo saber sobre las  pruebas de deteccin del cncer? Segn su historia clnica y sus antecedentes familiares, es posible que deba realizarse pruebas de deteccin del cncer en diferentes edades. Esto puede incluir pruebas de deteccin de lo siguiente:  Cncer de mama.  Cncer de cuello uterino.  Cncer colorrectal.  Cncer de piel.  Cncer de pulmn. Qu debo saber sobre la enfermedad cardaca, la diabetes y la hipertensin arterial? Presin arterial y enfermedad cardaca  La hipertensin arterial causa enfermedades cardacas y [place] el riesgo de accidente cerebrovascular. Es ms probable que esto se manifieste en las personas que tienen lecturas de presin arterial alta, tienen ascendencia africana o tienen sobrepeso.  Hgase controlar la presin arterial: ? Cada 3 a 5 aos si tiene entre 18 y 89 aos. ? Todos los aos si es mayor de [place]. Diabetes Realcese exmenes de deteccin de la diabetes con regularidad. Este anlisis revisa el nivel de azcar en la sangre en [place]. Hgase las pruebas de deteccin:  Cada tresaos despus de los 40aos de edad si tiene un peso normal y un bajo riesgo de padecer diabetes.  Con ms frecuencia y a partir de [place] edad inferior si tiene sobrepeso o un alto riesgo de padecer diabetes. Qu debo saber sobre la prevencin de infecciones? Hepatitis B Si tiene un riesgo ms alto de contraer hepatitis B, debe someterse a un examen de deteccin de este virus. Hable con el mdico para averiguar si tiene riesgo de contraer la infeccin por hepatitis B. Hepatitis C Se recomienda el anlisis a:  [employer] 1945 y 1965.  Todas las personas que tengan un riesgo de haber contrado hepatitis C. Enfermedades de transmisin sexual (ETS)  Hgase las pruebas de deteccin de ITS, incluidas la gonorrea y la clamidia, si: ? Es sexualmente activa y es menor de [place]. ? Es mayor de 24aos, y Public affairs consultant informa que corre riesgo de tener este tipo de  infecciones. ? La actividad sexual ha cambiado desde que le hicieron la ltima prueba de deteccin y tiene un riesgo mayor de Warehouse manager clamidia o Copy. Pregntele al mdico si usted tiene riesgo.  Pregntele al mdico si usted tiene un alto riesgo de Primary school teacher VIH. El mdico tambin puede recomendarle un medicamento recetado para ayudar a evitar la infeccin por el VIH. Si elige tomar medicamentos para prevenir el VIH, primero debe [employer] de deteccin del VIH. Luego debe hacerse anlisis cada mientras est tomando los medicamentos. Embarazo  Si est por dejar de Armed forces training and education officer (fase premenopusica) y usted puede quedar [place], busque asesoramiento antes de [place].  Tome de 400 a (mcg) de cido [employer] si [place].  Pida mtodos de control de la natalidad (anticonceptivos) si desea evitar un embarazo no deseado. Osteoporosis y [place] La osteoporosis es una enfermedad en la que los huesos pierden los minerales y la fuerza por el avance de la edad. El resultado pueden ser fracturas en los [place]. Si tiene 65aos o ms, o si est en riesgo de sufrir osteoporosis y fracturas, pregunte a su mdico si debe:  Hacerse pruebas de deteccin de prdida sea.  Tomar un suplemento de calcio o de vitamina D para reducir el riesgo de fracturas.  Recibir terapia de reemplazo hormonal (TRH) para tratar los sntomas de la menopausia. Siga estas instrucciones en su casa: Estilo de vida  No consuma ningn producto que contenga nicotina o tabaco, como cigarrillos, cigarrillos electrnicos y tabaco de Theatre manager. Si necesita ayuda para dejar de fumar, consulte al mdico.  No consuma drogas.  No comparta agujas.  Solicite ayuda a su mdico si necesita apoyo o informacin para abandonar las drogas. Consumo de alcohol  No beba alcohol si: ? Su mdico le indica no hacerlo. ? Est embarazada, puede estar embarazada o est tratando de quedar  embarazada.  Si bebe alcohol: ? Limite la cantidad que consume de 0 a 1 medida por da. ? Limite la ingesta si est amamantando.  Est atento a la cantidad de alcohol que hay en las bebidas que toma. En los [place], una medida equivale a una botella de cerveza de 12oz ( ), un vaso de vino de 5oz ( ) o un vaso de una bebida alcohlica de alta graduacin de 1oz (14ml). Instrucciones generales  Realcese los estudios de rutina de la salud, dentales y de Wellsite geologist.  Mantngase al da con las vacunas.  Infrmele a su mdico si: ? Se siente deprimida con frecuencia. ? Alguna vez ha sido vctima de [place] o no se siente segura en su casa. Resumen  Adoptar un estilo de vida saludable y recibir atencin preventiva son importantes para promover la salud y Counsellor.  Siga las instrucciones del mdico acerca de una dieta saludable, el ejercicio y la realizacin de pruebas o exmenes para Hotel manager.  Siga las instrucciones del mdico con respecto al control del colesterol y la presin arterial. Esta informacin no tiene Theme park manager el consejo del mdico. Asegrese de hacerle al mdico cualquier pregunta que tenga. Document Revised: 02/21/2018 Document Reviewed: 02/21/2018 Elsevier Patient Education  2021 Elsevier Inc. [place] Gonorrhea La gonorrea es una ITS (infeccin de transmisin sexual) que puede Art therapist a  los [employer] a las mujeres. Si la infeccin no se trata, puede tener estas consecuencias:  Producir daos en los rganos genitales femeninos o masculinos.  Impedir que las mujeres y los hombres puedan concebir (infertilidad).  Daar al beb en gestacin si una mujer infectada est embarazada. Es importante que reciba tratamiento para la gonorrea lo antes posible. Es posible que todas sus parejas sexuales tambin deban recibir tratamiento para la infeccin. Cules son las causas? La causa de la gonorrea es una bacteria llamada  Neisseria gonorrhoeae. La infeccin se contagia de Neomia Dear persona a otra por contacto sexual (por va anal, vaginal u oral). Un recin nacido puede contraer la infeccin de su madre durante el nacimiento. Qu incrementa el riesgo? Los siguientes factores pueden hacer que sea ms propenso a Aeronautical engineer afeccin:  Ser Music therapist de 25aos y llevar Neomia Dear vida sexual activa.  Ser hombre homosexual o bisexual y Child psychotherapist sexuales con hombres.  Tener una nueva pareja sexual o tener mltiples parejas sexuales.  Tener una pareja sexual con una infeccin de transmisin sexual (ITS).  No usar preservativos correctamente o no usarlos cada vez que tiene [employer].  Tener antecedentes de ITS.  Mantener relaciones sexuales a cambio de dinero o drogas. Cules son los signos o sntomas? Cuando se presentan sntomas, pueden ser [employer] mujeres y en los hombres. Entre los sntomas, se pueden incluir los siguientes:  Secrecin anormal por el pene o la vagina. La secrecin puede ser amarilla verdosa, espesa y [place].  Dolor o ardor al Geographical information systems officer.  Irritacin, dolor, sangrado o secrecin por el recto. Esto puede ocurrir si la infeccin se contagi durante sexo anal.  Dolor de garganta o ganglios linfticos hinchados en el cuello. Esto puede ocurrir si la infeccin se contagi durante sexo oral.  Dolor o hinchazn en los testculos en los hombres.  Sangrado entre los perodos [employer]. En algunos casos, no hay sntomas. Cmo se diagnostica? Esta afeccin se diagnostica en funcin de lo siguiente:  Un examen fsico.  Una muestra de la secrecin que se examina en el microscopio para detectar si hay presencia de bacterias. La secrecin podr tomarse del pene, la vagina, la garganta o el recto.  Anlisis de [place]. Es posible que no le [employer] de todos los estudios durante su visita. Cmo se trata? Esta afeccin se trata con antibiticos.  Es importante que comience el tratamiento lo antes posible. El tratamiento precoz puede prevenir el desarrollo de otras enfermedades. No debe Nutritional therapist. Debe evitar todo tipo de actividad sexual durante 7das, como mnimo, despus de haber terminado el tratamiento y [place] que sus parejas sexuales hayan recibido [place]. Siga estas instrucciones en su casa:  Use los medicamentos recetados y de [employer] se lo haya indicado el mdico. Termine todo el antibitico, aunque comience a sentirse mejor.  No mantenga relaciones sexuales durante el tratamiento.  No mantenga relaciones sexuales, como mnimo, hasta 7das despus de que usted y su pareja o parejas hayan completado el tratamiento y su mdico lo autorice.  Es su responsabilidad retirar los [employer] de la prueba. Consulte al mdico o pregunte en el departamento donde se realiza la prueba cundo estarn [employer].  Si el resultado del anlisis de [place] es positivo, informe a sus Chief Strategy Officer recientes. Esto incluye a cualquier pareja con quien haya tenido [place] sexual oral, anal o vaginal. Ellos deben [employer] para la gonorrea aunque no tengan sntomas.  Podran necesitar tratamiento, aunque [address]obtenga resultados negativos del anlisis de [place]gonorrea.  Beba suficiente lquido como para Pharmacologistmantener la orina de color amarillo plido.  Concurra a todas las visitas de [address]se[address] como se lo haya indicado el mdico. Esto es importante. Cmo se previene?  Use preservativos de ltex correctamente cada vez que tenga relaciones sexuales.  Pregntele a su pareja sexual o sus parejas sexuales si se han realizado anlisis de ITS y si los [address]resultados fueron negativos.  Evite tener mltiples compaeros sexuales.  Hgase exmenes con regularidad para detectar la presencia de ITS.   Comunquese con un mdico si:  Sus sntomas no han mejorado despus de [address]algunos das de  tratamiento con antibiticos.  Sus sntomas empeoran.  Presenta nuevas sntomas, por ejemplo: ? Irritacin en el ojo. ? Dolor o hinchazn de las articulaciones. ? Erupciones inusuales.  Tiene fiebre. Resumen  La gonorrea es una ITS (infeccin de transmisin sexual) que puede Audiological scientistafectar tanto a los hombres como a las mujeres.  La infeccin se contagia de Neomia Dearuna persona a otra por contacto sexual oral, anal o vaginal. Un recin nacido puede contraer la infeccin de su madre durante el nacimiento.  Los sntomas incluyen secrecin anormal, dolor o ardor al Geographical information systems officerorinar, o dolor en el recto.  Esta afeccin se trata con antibiticos. No mantenga relaciones sexuales, como mnimo, hasta 7das despus de que usted y las parejas sexuales que tenga hayan completado el tratamiento con antibiticos.  Infrmele a su mdico si los sntomas empeoran o si tiene nuevos sntomas. Esta informacin no tiene Theme park managercomo fin reemplazar el consejo del mdico. Asegrese de hacerle al mdico cualquier pregunta que tenga. Document Revised: 03/11/2019 Document Reviewed: 03/11/2019 Elsevier Patient Education  2021 Arvin[employer]itorElsevier Inc.

## 2020-02-26 LAB — C. TRACHOMATIS/N. GONORRHOEAE RNA
C. trachomatis RNA, TMA: NOT DETECTED
N. gonorrhoeae RNA, TMA: NOT DETECTED

## 2020-02-27 ENCOUNTER — Other Ambulatory Visit: Payer: Self-pay | Admitting: Nurse Practitioner

## 2020-02-27 LAB — HIV ANTIBODY (ROUTINE TESTING W REFLEX): HIV 1&2 Ab, 4th Generation: NONREACTIVE

## 2020-02-27 LAB — HEMOGLOBIN A1C
Hgb A1c MFr Bld: 5.4 % of total Hgb (ref <5.7)
Mean Plasma Glucose: 108 mg/dL
eAG (mmol/L): 6 mmol/L

## 2020-02-27 LAB — FLUORESCENT TREPONEMAL AB(FTA)-IGG-BLD: Fluorescent Treponemal ABS: NONREACTIVE

## 2020-02-27 LAB — RPR: RPR Ser Ql: REACTIVE — AB

## 2020-02-27 LAB — RPR TITER: RPR Titer: 1:8 {titer} — ABNORMAL HIGH

## 2020-03-09 NOTE — Progress Notes (Signed)
GYNECOLOGY  VISIT  CC:   Follow up on abdominal pain, talk about birth control, re-check RPR  HPI: 30 y.o. [id number] Single Other or two or more races female here for follow up to re-evaluate abdominal pain & discuss birth control. She stated that after taking antibiotics she no longer felt any abdominal pain. It is completely resolved. She and her partner are both treated and waited 7 days prior to having sex. Reports that she does have a vaginal odor, especially after sex.  She wants something for birth control, but does not want any hormones. She has been talking to friends and decided that she wants a Paragard IUD.  Regarding RPR, per guidance from Aletha Halim, DIS, will repeat RPR today to rule out early infection vs false positive reading last time.     GYNECOLOGIC HISTORY: Patient's last menstrual period was 02/28/2020. Contraception: none Menopausal hormone therapy: none  Patient Active Problem List   Diagnosis Date Noted  . Nexplanon insertion 10/28/2016  . NSVD (normal spontaneous vaginal delivery) 09/17/2016  . Oligohydramnios 09/14/2016  . Group B streptococcal infection during pregnancy 09/11/2016  . DM (diabetes mellitus), gestational 08/05/2016  . Supervision of high-risk pregnancy 04/05/2016    Past Medical History:  Diagnosis Date  . Allergy   . Diabetes mellitus without complication (HCC)    gestational  . Medical history non-contributory     Past Surgical History:  Procedure Laterality Date  . NO PAST SURGERIES      MEDS:   Current Outpatient Medications on File Prior to Visit  Medication Sig Dispense Refill  . ibuprofen (ADVIL,MOTRIN) 600 MG tablet Take 1 tablet (600 mg total) by mouth every 6 (six) hours as needed. 30 tablet 0   No current facility-administered medications on file prior to visit.    ALLERGIES: Fish allergy and Penicillins  Family History  Problem Relation Age of Onset  . Diabetes Maternal Grandmother   . Cancer Paternal Uncle         Brain    Review of Systems  PHYSICAL EXAMINATION:    BP 116/70   Pulse 68   Resp 16   Wt 199 lb (90.3 kg)   LMP 02/28/2020   BMI 38.86 kg/m     General appearance: alert, cooperative, no acute distress  Abdomen: soft, non-tender; bowel sounds normal; no masses,  no organomegaly Lymph:  no inguinal LAD noted  Pelvic: External genitalia:  no lesions              Urethra:  normal appearing urethra with no masses, tenderness or lesions              Bartholins and Skenes: normal                 Vagina: normal appearing vagina               Cervix: no cervical motion tenderness and no lesions              Bimanual Exam:  Uterus:  normal size, contour, position, consistency, mobility, non-tender              Adnexa: no mass, fullness, tenderness               Chaperone, Joy, CMA, was present for exam.  Assessment: F/U pelvic pain-resolved Positive RPR test - Plan: RPR  Vaginal odor - Plan: WET PREP FOR TRICH, YEAST, CLUE  General counseling and advice on contraceptive management  BV (bacterial vaginosis) - Plan:  metroNIDAZOLE (FLAGYL) 500 MG tablet  Plan: RPR drawn today Will pre-cert Paragard, schedule insertion while on menses, re-test GC/CT day of insertion Treat BV as above Encouraged her to ask partner to be tested for syphilis

## 2020-03-10 ENCOUNTER — Encounter: Payer: Self-pay | Admitting: Nurse Practitioner

## 2020-03-10 ENCOUNTER — Other Ambulatory Visit: Payer: Self-pay

## 2020-03-10 ENCOUNTER — Ambulatory Visit (INDEPENDENT_AMBULATORY_CARE_PROVIDER_SITE_OTHER): Payer: BC Managed Care – PPO | Admitting: Nurse Practitioner

## 2020-03-10 VITALS — BP 116/70 | HR 68 | Resp 16 | Wt 199.0 lb

## 2020-03-10 DIAGNOSIS — Z3009 Encounter for other general counseling and advice on contraception: Secondary | ICD-10-CM

## 2020-03-10 DIAGNOSIS — B9689 Other specified bacterial agents as the cause of diseases classified elsewhere: Secondary | ICD-10-CM

## 2020-03-10 DIAGNOSIS — A530 Latent syphilis, unspecified as early or late: Secondary | ICD-10-CM

## 2020-03-10 DIAGNOSIS — N898 Other specified noninflammatory disorders of vagina: Secondary | ICD-10-CM | POA: Diagnosis not present

## 2020-03-10 DIAGNOSIS — Z1159 Encounter for screening for other viral diseases: Secondary | ICD-10-CM | POA: Diagnosis not present

## 2020-03-10 DIAGNOSIS — N760 Acute vaginitis: Secondary | ICD-10-CM | POA: Diagnosis not present

## 2020-03-10 LAB — WET PREP FOR TRICH, YEAST, CLUE

## 2020-03-10 MED ORDER — METRONIDAZOLE 500 MG PO TABS
500.0000 mg | ORAL_TABLET | Freq: Two times a day (BID) | ORAL | 0 refills | Status: DC
Start: 2020-03-10 — End: 2020-04-10

## 2020-03-10 NOTE — Patient Instructions (Signed)
Vaginosis bacteriana Bacterial Vaginosis  La vaginosis bacteriana es una infeccin de la vagina. Se produce cuando crece una cantidad excesiva de grmenes normales (bacterias sanas) en la vagina. Esta infeccin puede facilitar contraer otras infecciones por las relaciones sexuales (infecciones de transmisin sexual, ITS). Es muy importante que las mujeres embarazadas reciban tratamiento. Esta infeccin puede hacer que los bebs nazcan antes de tiempo o tengan un bajo peso al nacer. Cules son las causas? La causa de esta infeccin es el aumento de ciertas bacterias que crecen en la vagina. No se puede contraer esta infeccin en los asientos de inodoros, en las sbanas, en las piscinas ni en los objetos que entran en contacto con la vagina. Qu incrementa el riesgo?  Tener relaciones sexuales con una persona nueva o con ms de una persona.  Tener relaciones sexuales sin proteccin.  Hacerse duchas vaginales.  Tener colocado un dispositivo intrauterino(DIU).  Fumar.  Consumir drogas o beber alcohol. Esto puede llevarla a hacer cosas que son riesgosas.  Tomar ciertos antibiticos.  Estar embarazada. Cules son los signos o sntomas? Algunas mujeres no tienen sntomas. Entre los sntomas, se pueden incluir los siguientes:  Secrecin de la vagina. Puede ser gris o blanca. Puede ser acuosa o espumosa.  Olor a pescado. Esto puede ocurrir despus de tener relaciones sexuales o durante el perodo menstrual.  Picazn en la vagina y a su alrededor.  Sensacin de ardor o dolor al hacer pis (orinar). Cmo se trata? Esta infeccin se trata con antibiticos. Estos pueden administrarse en las siguientes presentaciones:  [place].  Crema para la vagina.  Medicamento que se coloca dentro de la vagina (vulo vaginal). Si la infeccin reaparece despus del tratamiento, es posible que necesite ms antibiticos. Siga estas instrucciones en su casa: Medicamentos  Use los medicamentos de  venta libre y los recetados como se lo haya indicado el mdico.  Tome o use el antibitico como se lo haya indicado el mdico. No deje de tomarlo o usarlo aunque comience a sentirse mejor. Instrucciones generales  Si la persona con la que tiene relaciones sexuales es mujer, dgale que usted tiene esta infeccin. Ella tendr que concurrir a visitas de control con el mdico. Si tiene una pareja sexual hombre, l no necesita tratamiento.  No mantenga relaciones sexuales hasta finalizar el tratamiento.  Beba suficiente lquido como para mantener la orina de color amarillo plido.  Mantenga la vagina y el ano limpios. ? Lave la zona con agua tibia cada da. ? Cuando vaya al bao, higiencese de adelante hacia atrs.  Si est amamantando a un beb, pregunte al mdico si debera seguir hacindolo durante el tratamiento.  Cumpla con todas las visitas de seguimiento. Cmo se previene? Autocuidado  No se haga duchas vaginales.  Use nicamente agua tibia para lavar la zona alrededor de la vagina.  Use ropa interior de algodn o cuya parte de adentro sea de algodn.  No use pantalones ajustados ni pantis; en especial, durante el verano. Sexo seguro  Use proteccin al tener relaciones sexuales. Esto puede comprender lo siguiente: ? Use preservativos. ? Use barreras bucales. Estas son capas delgadas de ltex que protegen la boca durante el sexo oral.  Limite la cantidad de personas con las que tiene relaciones sexuales. Para prevenir esta infeccin, lo mejor es tener relaciones sexuales con una sola persona.  Hgase exmenes de ITS. Tambin debe hacerse los exmenes la persona con quien tiene relaciones sexuales. Drogas y alcohol  No fume ni consuma ningn producto que contenga nicotina o   tabaco. Si necesita ayuda para dejar de consumir estos productos, consulte al mdico.  No consuma drogas.  No beba alcohol si: ? El mdico le indica que no lo haga. ? Est embarazada, puede estar  embarazada o est tratando de quedar embarazada.  Si bebe alcohol: ? Limite la cantidad que consume de 0 a 1 medida por da. ? Sepa cunta cantidad de alcohol hay en las bebidas que toma. En los [place], una medida equivale a una botella de cerveza de 12oz ( ), un vaso de vino de 5oz ( ) o un vaso de una bebida alcohlica de alta graduacin de 1oz (21ml). Dnde buscar ms informacin  Centers for Disease Control and Prevention (Centros para el Control y la Prevencin de Event organiser): FootballExhibition.com.br  American Sexual Health Association (Asociacin Estadounidense de la Salud Sexual): www.ashastd.org  Office on [employer] (Oficina para la Salud de la Mujer): TravelLesson.ca Comunquese con un mdico si:  Los sntomas no mejoran, incluso despus de Medical illustrator.  Tiene ms secrecin o siente dolor al hacer pis.  Tiene fiebre o escalofros.  Tiene dolor en el vientre (abdomen) o en la zona que se encuentra entre las caderas.  Siente dolor durante el sexo.  Le sangra la vagina entre perodos menstruales. Resumen  Esta infeccin puede ocurrir cuando crecen demasiados grmenes (bacterias) en la vagina.  Esta infeccin puede facilitar contraer infecciones por las relaciones sexuales (infecciones de transmisin sexual, ITS). El tratamiento puede disminuir esa posibilidad.  Reciba tratamiento si est embarazada. Esta infeccin puede hacer que los bebs nazcan antes de [place].  No deje de tomar ni de usar el antibitico aunque comience a sentirse mejor. Esta informacin no tiene Theme park manager el consejo del mdico. Asegrese de hacerle al mdico cualquier pregunta que tenga. Document Revised: 09/04/2019 Document Reviewed: 09/04/2019 Elsevier Patient Education  2021 Elsevier Inc.   Colocacin de un dispositivo intrauterino, cuidados posteriores Intrauterine Device Insertion, Care After [employer] brinda informacin sobre cmo cuidarse despus  del procedimiento. Su mdico tambin podr darle instrucciones ms especficas. Comunquese con el mdico si tiene problemas o preguntas. Qu puedo esperar despus del procedimiento? Despus del procedimiento, es normal tener los siguientes sntomas:  Dolor y clicos abdominales.  Sangrado. Puede ser ligero o intenso. Esto puede durar [employer].  Dolor en la parte inferior de la espalda.  Mareos.  Dolores de [place].  Nuseas. Siga estas instrucciones en su casa:  Antes de [employer] sexual nuevamente, revise la zona y asegrese de poder tocar el hilo o los hilos del DIU. Debe poder tocar el extremo del hilo por debajo de la abertura del cuello uterino. Si el hilo del DIU est en su lugar, puede retomar la actividad sexual. ? Si le colocaron un DIU hormonal despus de que hayan pasado 7das del inicio del perodo menstrual ms reciente, Sports coach un mtodo anticonceptivo adicional durante los 7das posteriores a Education officer, community del DIU. Pregntele al mdico si esto es vlido para su caso.  Siga controlando que el DIU est en su lugar; para ello, toque los hilos despus de cada perodo menstrual o una vez al mes.  El DIU no la proteger de las infecciones de transmisin sexual (ITS). Use mtodos que eviten el intercambio de fluidos corporales entre la pareja (anticonceptivos de barrera) siempre que tenga relaciones sexuales. Los anticonceptivos de Warehouse manager se pueden usar durante las relaciones sexuales orales, vaginales o [place]. Algunos de los mtodos de barrera ms usados son los siguientes: ? Preservativo masculino. ? Preservativo  femenino. ? Preservativo bucal.  Use los medicamentos de venta libre y los recetados solamente como se lo haya indicado el mdico.  Oceanographer a todas las visitas de seguimiento como se lo haya indicado el mdico. Esto es importante.   Comunquese con un mdico si:  Se siente dbil o siente que va a desvanecerse.  Tiene cualquiera de los  siguientes problemas con el hilo o los hilos del DIU: ? El hilo le [place] o lo lastima a usted o a su pareja sexual. ? No puede tocar el hilo. ? El hilo se ha alargado.  Puede sentir el DIU en la vagina.  Cree que puede estar embarazada o no tiene su perodo menstrual.  Cree que puede tener una infeccin de transmisin sexual (ITS). Solicite ayuda de inmediato si:  Tiene sntomas similares a los de la gripe, como cansancio (fatiga) y Research scientist (life sciences).  Siente escalofros o tiene fiebre.  Tiene un sangrado ms abundante o dura ms de un ciclo menstrual normal.  Le sale una secrecin anormal o con mal olor de la vagina.  Siente un dolor abdominal nuevo, que empeora o que no est en la misma zona en la que sinti antes los clicos y Chief Technology Officer.  Siente dolor durante la actividad sexual. Resumen  Despus del procedimiento, es comn tener dolor y clicos en el abdomen. Tambin es comn tener un sangrado leve o un sangrado ms abundante, similar al perodo menstrual.  Siga controlando que el DIU est en su lugar; para ello, toque los hilos despus de cada perodo menstrual o una vez al mes.  Concurra a todas las visitas de [address] se lo haya indicado el mdico. Esto es importante.  Comunquese con el mdico si tiene problemas con los hilos del DIU, por ejemplo, si el hilo se alarga o si le resulta molesto a usted o a su pareja sexual durante la actividad sexual. Esta informacin no tiene Theme park manager el consejo del mdico. Asegrese de hacerle al mdico cualquier pregunta que tenga. Document Revised: 03/08/2019 Document Reviewed: 03/08/2019 Elsevier Patient Education  2021 [employer].

## 2020-03-12 LAB — RPR: RPR Ser Ql: REACTIVE — AB

## 2020-03-12 LAB — FLUORESCENT TREPONEMAL AB(FTA)-IGG-BLD: Fluorescent Treponemal ABS: NONREACTIVE

## 2020-03-12 LAB — RPR TITER: RPR Titer: 1:8 {titer} — ABNORMAL HIGH

## 2020-03-31 ENCOUNTER — Ambulatory Visit: Payer: BC Managed Care – PPO | Admitting: Nurse Practitioner

## 2020-04-10 ENCOUNTER — Encounter: Payer: Self-pay | Admitting: Nurse Practitioner

## 2020-04-10 ENCOUNTER — Ambulatory Visit (INDEPENDENT_AMBULATORY_CARE_PROVIDER_SITE_OTHER): Payer: BC Managed Care – PPO | Admitting: Nurse Practitioner

## 2020-04-10 ENCOUNTER — Other Ambulatory Visit: Payer: Self-pay

## 2020-04-10 VITALS — BP 120/70 | HR 70 | Resp 16 | Wt 199.0 lb

## 2020-04-10 DIAGNOSIS — Z30430 Encounter for insertion of intrauterine contraceptive device: Secondary | ICD-10-CM | POA: Diagnosis not present

## 2020-04-10 DIAGNOSIS — Z30431 Encounter for routine checking of intrauterine contraceptive device: Secondary | ICD-10-CM | POA: Diagnosis not present

## 2020-04-10 DIAGNOSIS — Z3009 Encounter for other general counseling and advice on contraception: Secondary | ICD-10-CM

## 2020-04-10 LAB — PREGNANCY, URINE: Preg Test, Ur: NEGATIVE

## 2020-04-10 MED ORDER — PARAGARD INTRAUTERINE COPPER IU IUD: INTRAUTERINE_SYSTEM | Freq: Once | INTRAUTERINE | Status: DC

## 2020-04-10 NOTE — Progress Notes (Signed)
GYNECOLOGY  VISIT  CC:   ParaGard IUD insertion  HPI: 30 y.o. G2P1011 Single Other or two or more races female here for Paragard Insert  Pt reports has used condoms every time she had intercourse since last appointment. UPT neg today  GYNECOLOGIC HISTORY: Patient's last menstrual period was 03/30/2020 (exact date). Contraception: Condoms  Patient Active Problem List   Diagnosis Date Noted  . Nexplanon insertion 10/28/2016  . NSVD (normal spontaneous vaginal delivery) 09/17/2016  . Oligohydramnios 09/14/2016  . Group B streptococcal infection during pregnancy 09/11/2016  . DM (diabetes mellitus), gestational 08/05/2016  . Supervision of high-risk pregnancy 04/05/2016    Past Medical History:  Diagnosis Date  . Allergy   . Diabetes mellitus without complication (HCC)    gestational  . Medical history non-contributory     Past Surgical History:  Procedure Laterality Date  . NO PAST SURGERIES      MEDS:   Current Outpatient Medications on File Prior to Visit  Medication Sig Dispense Refill  . ibuprofen (ADVIL,MOTRIN) 600 MG tablet Take 1 tablet (600 mg total) by mouth every 6 (six) hours as needed. 30 tablet 0   No current facility-administered medications on file prior to visit.    ALLERGIES: Fish allergy and Penicillins  Family History  Problem Relation Age of Onset  . Diabetes Maternal Grandmother   . Cancer Paternal Uncle        Brain      Review of Systems  PHYSICAL EXAMINATION:    Wt 199 lb (90.3 kg)   LMP 03/30/2020 (Exact Date)   BMI 38.86 kg/m        Patient Active Problem List   Diagnosis Date Noted  . Nexplanon insertion 10/28/2016  . NSVD (normal spontaneous vaginal delivery) 09/17/2016  . Oligohydramnios 09/14/2016  . Group B streptococcal infection during pregnancy 09/11/2016  . DM (diabetes mellitus), gestational 08/05/2016  . Supervision of high-risk pregnancy 04/05/2016   Past Medical History:  Diagnosis Date  . Allergy   .  Diabetes mellitus without complication (HCC)    gestational  . Medical history non-contributory    Current Outpatient Medications on File Prior to Visit  Medication Sig Dispense Refill  . ibuprofen (ADVIL,MOTRIN) 600 MG tablet Take 1 tablet (600 mg total) by mouth every 6 (six) hours as needed. 30 tablet 0   No current facility-administered medications on file prior to visit.   Fish allergy and Penicillins  ROS Vitals:   04/10/20 1541  BP: 120/70  Pulse: 70  Resp: 16  Weight: 199 lb (90.3 kg)    Gen:  WNWF healthy female NAD Abdomen: soft, non-tender  Pelvic exam: Vulva:  normal female genitalia Vagina:  Normal discharge Cervix:  Non-tender, Negative CMT, no lesions or redness. Uterus: normal size and shape, non tender, mid position   Procedure:  Speculum inserted.  Cervix visualized and cleansed with Betadine x 3.  .  Single toothed tenaculum applied to anterior lip of cervix without difficulty.  Uterus sounded to 8cm   IUD was inserted into endometrial cavity.  Strings cut to 3 cm.  Small bleeding noted.  Pt tolerated the procedure well.  All instruments removed from vagina.  A: Insertion of Paragard IUD   P:  Return for recheck 4-6 weeks Post procedure instructions included in after visit summary in Spanish Pt aware to call for any concerns Pt aware removal due no later than 03/2030.  IUD card given to pt.  Chaperone, Joy, CMA, was present for exam.  Assessment:  Visit for insertion of Paragard intrauterine device - Plan: Pregnancy, urine, C. trachomatis/N. gonorrhoeae RNA  General counseling and advice on contraceptive management - Plan: IUD Insertion, paragard intrauterine copper IUD   Plan: F/U 4 weeks for string check

## 2020-04-10 NOTE — Patient Instructions (Signed)
Colocacin de un dispositivo intrauterino, cuidados posteriores Intrauterine Device Insertion, Care After [employer] brinda informacin sobre cmo cuidarse despus del procedimiento. Su mdico tambin podr darle instrucciones ms especficas. Comunquese con el mdico si tiene problemas o preguntas. Qu puedo esperar despus del procedimiento? Despus del procedimiento, es normal tener los siguientes sntomas:  Dolor y clicos abdominales.  Sangrado. Puede ser ligero o intenso. Esto puede durar [employer].  Dolor en la parte inferior de la espalda.  Mareos.  Dolores de [place].  Nuseas. Siga estas instrucciones en su casa:  Antes de [employer] sexual nuevamente, revise la zona y asegrese de poder tocar el hilo o los hilos del DIU. Debe poder tocar el extremo del hilo por debajo de la abertura del cuello uterino. Si el hilo del DIU est en su lugar, puede retomar la actividad sexual.  Siga controlando que el DIU est en su lugar; para ello, toque los hilos despus de cada perodo menstrual o una vez al mes.  El DIU no la proteger de las infecciones de transmisin sexual (ITS). Use mtodos que eviten el intercambio de fluidos corporales entre la pareja (anticonceptivos de barrera) siempre que tenga relaciones sexuales. Los anticonceptivos de Warehouse manager se pueden usar durante las relaciones sexuales orales, vaginales o [place]. Algunos de los mtodos de barrera ms usados son los siguientes: ? Preservativo masculino. ? Preservativo femenino. ? Preservativo bucal.  Use los medicamentos de venta libre y los recetados solamente como se lo haya indicado el mdico.  Oceanographer a todas las visitas de seguimiento como se lo haya indicado el mdico. Esto es importante.   Comunquese con un mdico si:  Se siente dbil o siente que va a desvanecerse.  Tiene cualquiera de los siguientes problemas con el hilo o los hilos del DIU: ? El hilo le [place] o lo lastima a usted o a su pareja  sexual. ? No puede tocar el hilo. ? El hilo se ha alargado.  Puede sentir el DIU en la vagina.  Cree que puede estar embarazada o no tiene su perodo menstrual.  Cree que puede tener una infeccin de transmisin sexual (ITS). Solicite ayuda de inmediato si:  Tiene sntomas similares a los de la gripe, como cansancio (fatiga) y Research scientist (life sciences).  Siente escalofros o tiene fiebre.  Tiene un sangrado ms abundante o dura ms de un ciclo menstrual normal.  Le sale una secrecin anormal o con mal olor de la vagina.  Siente un dolor abdominal nuevo, que empeora o que no est en la misma zona en la que sinti antes los clicos y Chief Technology Officer.  Siente dolor durante la actividad sexual. Resumen  Despus del procedimiento, es comn tener dolor y clicos en el abdomen. Tambin es comn tener un sangrado leve o un sangrado ms abundante, similar al perodo menstrual.  Siga controlando que el DIU est en su lugar; para ello, toque los hilos despus de cada perodo menstrual o una vez al mes.  Concurra a todas las visitas de [address] se lo haya indicado el mdico. Esto es importante.  Comunquese con el mdico si tiene problemas con los hilos del DIU, por ejemplo, si el hilo se alarga o si le resulta molesto a usted o a su pareja sexual durante la actividad sexual. Esta informacin no tiene Theme park manager el consejo del mdico. Asegrese de hacerle al mdico cualquier pregunta que tenga. Document Revised: 03/08/2019 Document Reviewed: 03/08/2019 Elsevier Patient Education  2021 [employer].

## 2020-04-13 LAB — C. TRACHOMATIS/N. GONORRHOEAE RNA
C. trachomatis RNA, TMA: NOT DETECTED
N. gonorrhoeae RNA, TMA: NOT DETECTED

## 2020-04-14 NOTE — Progress Notes (Signed)
Spanish speaking, Please notify negative gonorrhea and chlamydia test.

## 2020-04-29 ENCOUNTER — Other Ambulatory Visit: Payer: Self-pay

## 2020-04-29 ENCOUNTER — Encounter: Payer: Self-pay | Admitting: Nurse Practitioner

## 2020-04-29 ENCOUNTER — Ambulatory Visit (INDEPENDENT_AMBULATORY_CARE_PROVIDER_SITE_OTHER): Payer: BC Managed Care – PPO | Admitting: Nurse Practitioner

## 2020-04-29 VITALS — BP 116/72 | HR 94 | Resp 14 | Ht 60.0 in | Wt 202.0 lb

## 2020-04-29 DIAGNOSIS — B9689 Other specified bacterial agents as the cause of diseases classified elsewhere: Secondary | ICD-10-CM

## 2020-04-29 DIAGNOSIS — N941 Unspecified dyspareunia: Secondary | ICD-10-CM | POA: Diagnosis not present

## 2020-04-29 DIAGNOSIS — N898 Other specified noninflammatory disorders of vagina: Secondary | ICD-10-CM | POA: Diagnosis not present

## 2020-04-29 DIAGNOSIS — N760 Acute vaginitis: Secondary | ICD-10-CM

## 2020-04-29 DIAGNOSIS — Z30431 Encounter for routine checking of intrauterine contraceptive device: Secondary | ICD-10-CM | POA: Diagnosis not present

## 2020-04-29 LAB — WET PREP FOR TRICH, YEAST, CLUE

## 2020-04-29 MED ORDER — METRONIDAZOLE 500 MG PO TABS: 500.0000 mg | ORAL_TABLET | Freq: Two times a day (BID) | ORAL | 0 refills | Status: DC

## 2020-04-29 NOTE — Progress Notes (Signed)
   Acute Office Visit  Subjective:    Patient ID: Kristy Nguyen, female    DOB: November 14, 1990, 30 y.o.   MRN: [id number]   HPI 30 y.o. presents today for vaginal odor and yellow discharge that started about 5 days ago. She also has had abdominal cramping after intercourse that lasts a couple of hours. Paraguard inserted 04/10/2020. She is unable to feel strings herself and partner has not been able to either, so she is worried it has been misplaced. She was treated for BV 1 month ago. Negative STD testing at that time. Interpreter present during visit.    Review of Systems  Constitutional: Negative.   Gastrointestinal: Positive for abdominal pain.  Genitourinary: Positive for dyspareunia, vaginal bleeding (spotting) and vaginal discharge.       Vaginal odor       Objective:    Physical Exam Constitutional:      Appearance: Normal appearance. She is obese.  Abdominal:     Tenderness: There is no abdominal tenderness.  Genitourinary:    General: Normal vulva.     Vagina: Vaginal discharge present. No erythema.     Cervix: Normal.     Comments: IUD string visible at endocervix    BP 116/72 (BP Location: Right Arm, Patient Position: Sitting, Cuff Size: Large)   Pulse 94   Resp 14   Ht 5' (1.524 m)   Wt 202 lb (91.6 kg)   LMP 04/25/2020   BMI 39.45 kg/m  Wt Readings from Last 3 Encounters:  04/29/20 202 lb (91.6 kg)  04/10/20 199 lb (90.3 kg)  03/10/20 199 lb (90.3 kg)   Wet prep + clue cells     Assessment & Plan:   Problem List Items Addressed This Visit   None   Visit Diagnoses    Bacterial vaginosis    -  Primary   Relevant Medications   metroNIDAZOLE (FLAGYL) 500 MG tablet   IUD check up       Vaginal discharge       Relevant Orders   WET PREP FOR TRICH, YEAST, CLUE   Dyspareunia in female         Plan: Wet prep positive for clue cells. Flagyl 500 mg BID x 7 days. Instructed to take with food and avoid alcohol. We discussed that cramping can occur  with intercourse initially due to adjustment of new device. If pain continues we will need to discuss other options. She is agreeable to plan.    Olivia Mackie DNP, 2:41 PM 04/29/2020

## 2020-05-08 ENCOUNTER — Ambulatory Visit: Payer: BC Managed Care – PPO | Admitting: Nurse Practitioner

## 2020-06-17 DIAGNOSIS — R197 Diarrhea, unspecified: Secondary | ICD-10-CM | POA: Diagnosis not present

## 2020-06-17 DIAGNOSIS — R112 Nausea with vomiting, unspecified: Secondary | ICD-10-CM | POA: Diagnosis not present

## 2020-07-28 ENCOUNTER — Other Ambulatory Visit: Payer: Self-pay

## 2020-07-28 ENCOUNTER — Encounter: Payer: Self-pay | Admitting: Obstetrics & Gynecology

## 2020-07-28 ENCOUNTER — Ambulatory Visit (INDEPENDENT_AMBULATORY_CARE_PROVIDER_SITE_OTHER): Payer: BC Managed Care – PPO | Admitting: Obstetrics & Gynecology

## 2020-07-28 VITALS — BP 110/68

## 2020-07-28 DIAGNOSIS — N898 Other specified noninflammatory disorders of vagina: Secondary | ICD-10-CM

## 2020-07-28 DIAGNOSIS — Z113 Encounter for screening for infections with a predominantly sexual mode of transmission: Secondary | ICD-10-CM | POA: Diagnosis not present

## 2020-07-28 DIAGNOSIS — R300 Dysuria: Secondary | ICD-10-CM

## 2020-07-28 LAB — WET PREP FOR TRICH, YEAST, CLUE

## 2020-07-28 MED ORDER — FLUCONAZOLE 150 MG PO TABS: 150.0000 mg | ORAL_TABLET | Freq: Every day | ORAL | 2 refills | Status: AC

## 2020-07-28 MED ORDER — SULFAMETHOXAZOLE-TRIMETHOPRIM 800-160 MG PO TABS: 1.0000 | ORAL_TABLET | Freq: Two times a day (BID) | ORAL | 0 refills | Status: AC

## 2020-07-28 NOTE — Progress Notes (Signed)
Kristy Nguyen 22-Dec-1990 [id number]   History:    30 y.o. G5P1A1L1 Married  RP:  Established patient presenting for vaginal itching and burning with urination  HPI: C/O vaginal itching and burning with urination x 3 days.  No pelvic pain.  Well on Paragard IUD x 04/10/2020.  Normal menses every month, LMP 06/28/2020.  No fever.  Past medical history,surgical history, family history and social history were all reviewed and documented in the EPIC chart.  Gynecologic History Patient's last menstrual period was 06/28/2020 (exact date).  Obstetric History OB History  Gravida Para Term Preterm AB Living  2 1 1  0 1 1  SAB IAB Ectopic Multiple Live Births  1 0 0 0 1    # Outcome Date GA Lbr Len/2nd Weight Sex Delivery Anes PTL Lv  2 Term 09/15/16 [redacted]w[redacted]d / 00:13 7 lb 1.4 oz (3.215 kg) M Vag-Spont EPI  LIV  1 SAB              ROS: A ROS was performed and pertinent positives and negatives are included in the history.  GENERAL: No fevers or chills. HEENT: No change in vision, no earache, sore throat or sinus congestion. NECK: No pain or stiffness. CARDIOVASCULAR: No chest pain or pressure. No palpitations. PULMONARY: No shortness of breath, cough or wheeze. GASTROINTESTINAL: No abdominal pain, nausea, vomiting or diarrhea, melena or bright red blood per rectum. GENITOURINARY: No urinary frequency, urgency, hesitancy or dysuria. MUSCULOSKELETAL: No joint or muscle pain, no back pain, no recent trauma. DERMATOLOGIC: No rash, no itching, no lesions. ENDOCRINE: No polyuria, polydipsia, no heat or cold intolerance. No recent change in weight. HEMATOLOGICAL: No anemia or easy bruising or bleeding. NEUROLOGIC: No headache, seizures, numbness, tingling or weakness. PSYCHIATRIC: No depression, no loss of interest in normal activity or change in sleep pattern.     Exam:   BP 110/68   LMP 06/28/2020 (Exact Date) Comment: Has paraguard IUD  There is no height or weight on file to calculate  BMI.  General appearance : Well developed well nourished female. No acute distress  Pelvic: Vulva: Normal             Vagina: No gross lesions  Increased discharge.  Wet prep done.  Cervix: No gross lesions or discharge.  IUD strings visible at external os.  Gono-Chlam done.  Uterus  AV, normal size, shape and consistency, non-tender and mobile  Adnexa  Without masses or tenderness  Anus: Normal  Wet prep:  Yeasts present with hyphae U/A: Yellow cloudy, nitrites negative, protein 2+, white blood cells packed, red blood cells packed, bacteria many.  Urine culture pending.   Assessment/Plan:  30 y.o. female  1. Vagina itching Severe yeast vaginitis confirmed by wet prep.  Will treat with fluconazole 150 mg 1 tablet daily for 5 days.  Usage reviewed.  Prescription sent to pharmacy with refills. - WET PREP FOR TRICH, YEAST, CLUE  2. Dysuria Abnormal urine analysis.  Probable acute cystitis.  Will treat with Bactrim DS 1 tablet twice a day for 3 days.  Usage reviewed and prescription sent to pharmacy. - Urinalysis,Complete w/RFL Culture  3. Screen for STD (sexually transmitted disease) - C. trachomatis/N. gonorrhoeae RNA - HIV antibody (with reflex) - RPR - Hepatitis B Surface AntiGEN - Hepatitis C Antibody  Other orders - sulfamethoxazole-trimethoprim (BACTRIM DS) 800-160 MG tablet; Take 1 tablet by mouth 2 (two) times daily for 3 days. - fluconazole (DIFLUCAN) 150 MG tablet; Take 1 tablet (150 mg total)  by mouth daily for 5 days.   Genia Del MD, 3:50 PM 07/28/2020

## 2020-07-29 LAB — HEPATITIS C ANTIBODY
Hepatitis C Ab: NONREACTIVE
SIGNAL TO CUT-OFF: 0.01 (ref <1.00)

## 2020-07-29 LAB — HEPATITIS B SURFACE ANTIGEN: Hepatitis B Surface Ag: NONREACTIVE

## 2020-07-29 LAB — RPR: RPR Ser Ql: NONREACTIVE

## 2020-07-29 LAB — HIV ANTIBODY (ROUTINE TESTING W REFLEX): HIV 1&2 Ab, 4th Generation: NONREACTIVE

## 2020-07-31 ENCOUNTER — Encounter: Payer: Self-pay | Admitting: Obstetrics & Gynecology

## 2020-07-31 LAB — URINALYSIS, COMPLETE W/RFL CULTURE
Bilirubin Urine: NEGATIVE
Glucose, UA: NEGATIVE
Hyaline Cast: NONE SEEN /LPF
Nitrites, Initial: NEGATIVE
Specific Gravity, Urine: 1.021 (ref 1.001–1.035)
pH: 5.5 (ref 5.0–8.0)

## 2020-07-31 LAB — C. TRACHOMATIS/N. GONORRHOEAE RNA
C. trachomatis RNA, TMA: NOT DETECTED
N. gonorrhoeae RNA, TMA: NOT DETECTED

## 2020-07-31 LAB — CULTURE INDICATED

## 2020-07-31 LAB — URINE CULTURE
MICRO NUMBER:: 12005321
SPECIMEN QUALITY:: ADEQUATE

## 2020-08-10 DIAGNOSIS — J069 Acute upper respiratory infection, unspecified: Secondary | ICD-10-CM | POA: Diagnosis not present

## 2020-08-11 ENCOUNTER — Other Ambulatory Visit: Payer: Self-pay | Admitting: Anesthesiology

## 2020-08-11 MED ORDER — CIPROFLOXACIN HCL 500 MG PO TABS: 500.0000 mg | ORAL_TABLET | Freq: Two times a day (BID) | ORAL | 0 refills | Status: DC

## 2020-08-21 ENCOUNTER — Ambulatory Visit (INDEPENDENT_AMBULATORY_CARE_PROVIDER_SITE_OTHER): Payer: BC Managed Care – PPO | Admitting: Obstetrics & Gynecology

## 2020-08-21 ENCOUNTER — Other Ambulatory Visit: Payer: Self-pay

## 2020-08-21 ENCOUNTER — Encounter: Payer: Self-pay | Admitting: Obstetrics & Gynecology

## 2020-08-21 VITALS — BP 110/76 | HR 76 | Resp 16

## 2020-08-21 DIAGNOSIS — Z3169 Encounter for other general counseling and advice on procreation: Secondary | ICD-10-CM | POA: Diagnosis not present

## 2020-08-21 DIAGNOSIS — R102 Pelvic and perineal pain: Secondary | ICD-10-CM

## 2020-08-21 DIAGNOSIS — Z113 Encounter for screening for infections with a predominantly sexual mode of transmission: Secondary | ICD-10-CM | POA: Diagnosis not present

## 2020-08-21 DIAGNOSIS — Z30432 Encounter for removal of intrauterine contraceptive device: Secondary | ICD-10-CM | POA: Diagnosis not present

## 2020-08-21 LAB — PREGNANCY, URINE: Preg Test, Ur: NEGATIVE

## 2020-08-21 NOTE — Progress Notes (Signed)
    Kristy Nguyen 1990/11/16 [id number]        30 y.o.  G2P1011 Getting married in 3 months  RP: Severe dysmenorrhea since ParaGard IUD insertion 03/2020  HPI: Severe dysmenorrhea since ParaGard IUD insertion 03/2020.  Menses with normal flow every month.  No pelvic pain or vaginal discharge.  Requests STI screen.  Would like to conceive after getting married in 3 months.     OB History  Gravida Para Term Preterm AB Living  2 1 1  0 1 1  SAB IAB Ectopic Multiple Live Births  1 0 0 0 1    # Outcome Date GA Lbr Len/2nd Weight Sex Delivery Anes PTL Lv  2 Term 09/15/16 [redacted]w[redacted]d / 00:13 7 lb 1.4 oz (3.215 kg) M Vag-Spont EPI  LIV  1 SAB             Past medical history,surgical history, problem list, medications, allergies, family history and social history were all reviewed and documented in the EPIC chart.   Directed ROS with pertinent positives and negatives documented in the history of present illness/assessment and plan.  Exam:  Vitals:   08/21/20 1434  BP: 110/76  Pulse: 76  Resp: 16   General appearance:  Normal  Abdomen: Normal  Gynecologic exam: Vulva normal.  Speculum:  Cervix/Vagina normal.  Gono-Chlam done.  IUD removal:  Verbal informed consent obtained.  Speculum:  Cervix with IUD strings visible at the EO.  IUD strings grasped with a ring clamp.  Easy removal of IUD.  IUD complete, intact.  Well tolerated without Cx.  UPT Neg   Assessment/Plan:  30 y.o. G2P1011   1. Pelvic cramping Dysmenorrhea with ParaGard IUD.  Would also like to conceive in the near future.  Decision therefore to remove the ParaGard IUD.  Easy removal without complication.  Urine pregnancy test negative. - Pregnancy, urine  2. Screen for STD (sexually transmitted disease) - HIV antibody (with reflex) - RPR - Hepatitis B Surface AntiGEN - Hepatitis C Antibody - C. trachomatis/N. gonorrhoeae RNA  3. Encounter for IUD removal Easy IUD removal.  No complication.   Well-tolerated.  4. Encounter for preconception consultation Recommend improving fitness and nutrition.  Will start on prenatal vitamins.  Other orders - Multiple Vitamin (MULTIVITAMIN PO); Take by mouth.   10/22/20 MD, 3:03 PM 08/21/2020

## 2020-08-24 LAB — HEPATITIS C ANTIBODY
Hepatitis C Ab: NONREACTIVE
SIGNAL TO CUT-OFF: 0.01 (ref <1.00)

## 2020-08-24 LAB — HEPATITIS B SURFACE ANTIGEN: Hepatitis B Surface Ag: NONREACTIVE

## 2020-08-24 LAB — HIV ANTIBODY (ROUTINE TESTING W REFLEX): HIV 1&2 Ab, 4th Generation: NONREACTIVE

## 2020-08-24 LAB — C. TRACHOMATIS/N. GONORRHOEAE RNA
C. trachomatis RNA, TMA: DETECTED — AB
N. gonorrhoeae RNA, TMA: NOT DETECTED

## 2020-08-24 LAB — RPR: RPR Ser Ql: NONREACTIVE

## 2020-09-02 ENCOUNTER — Encounter: Payer: Self-pay | Admitting: Anesthesiology

## 2020-09-02 ENCOUNTER — Other Ambulatory Visit: Payer: Self-pay | Admitting: Anesthesiology

## 2020-09-02 MED ORDER — DOXYCYCLINE HYCLATE 100 MG PO CAPS: 100.0000 mg | ORAL_CAPSULE | Freq: Two times a day (BID) | ORAL | 0 refills | Status: DC

## 2020-11-11 ENCOUNTER — Other Ambulatory Visit: Payer: Self-pay

## 2020-11-11 ENCOUNTER — Encounter (HOSPITAL_COMMUNITY): Payer: Self-pay | Admitting: Emergency Medicine

## 2020-11-11 ENCOUNTER — Emergency Department (HOSPITAL_COMMUNITY)
Admission: EM | Admit: 2020-11-11 | Discharge: 2020-11-12 | Disposition: A | Discharge status: Home / Self Care | Payer: BC Managed Care – PPO | Attending: Emergency Medicine | Admitting: Emergency Medicine

## 2020-11-11 DIAGNOSIS — R109 Unspecified abdominal pain: Secondary | ICD-10-CM | POA: Diagnosis not present

## 2020-11-11 DIAGNOSIS — Z87891 Personal history of nicotine dependence: Secondary | ICD-10-CM | POA: Insufficient documentation

## 2020-11-11 DIAGNOSIS — N390 Urinary tract infection, site not specified: Secondary | ICD-10-CM

## 2020-11-11 DIAGNOSIS — A64 Unspecified sexually transmitted disease: Secondary | ICD-10-CM

## 2020-11-11 DIAGNOSIS — R300 Dysuria: Secondary | ICD-10-CM | POA: Insufficient documentation

## 2020-11-11 DIAGNOSIS — D72829 Elevated white blood cell count, unspecified: Secondary | ICD-10-CM | POA: Insufficient documentation

## 2020-11-11 DIAGNOSIS — R112 Nausea with vomiting, unspecified: Secondary | ICD-10-CM | POA: Diagnosis not present

## 2020-11-11 DIAGNOSIS — N7689 Other specified inflammation of vagina and vulva: Secondary | ICD-10-CM | POA: Diagnosis not present

## 2020-11-11 DIAGNOSIS — R1031 Right lower quadrant pain: Secondary | ICD-10-CM | POA: Diagnosis not present

## 2020-11-11 DIAGNOSIS — E119 Type 2 diabetes mellitus without complications: Secondary | ICD-10-CM | POA: Diagnosis not present

## 2020-11-11 LAB — PREGNANCY, URINE: Preg Test, Ur: NEGATIVE

## 2020-11-11 LAB — COMPREHENSIVE METABOLIC PANEL
ALT: 16 U/L (ref 0–44)
AST: 20 U/L (ref 15–41)
Albumin: 3.7 g/dL (ref 3.5–5.0)
Alkaline Phosphatase: 91 U/L (ref 38–126)
Anion gap: 7 (ref 5–15)
BUN: 10 mg/dL (ref 6–20)
CO2: 24 mmol/L (ref 22–32)
Calcium: 9.4 mg/dL (ref 8.9–10.3)
Chloride: 106 mmol/L (ref 98–111)
Creatinine, Ser: 0.7 mg/dL (ref 0.44–1.00)
GFR, Estimated: >60 mL/min (ref >60)
Glucose, Bld: 106 mg/dL — ABNORMAL HIGH (ref 70–99)
Potassium: 4.2 mmol/L (ref 3.5–5.1)
Sodium: 137 mmol/L (ref 135–145)
Total Bilirubin: 0.1 mg/dL — ABNORMAL LOW (ref 0.3–1.2)
Total Protein: 7.3 g/dL (ref 6.5–8.1)

## 2020-11-11 LAB — URINALYSIS, ROUTINE W REFLEX MICROSCOPIC
Bilirubin Urine: NEGATIVE
Glucose, UA: NEGATIVE mg/dL
Hgb urine dipstick: NEGATIVE
Ketones, ur: 5 mg/dL — AB
Nitrite: NEGATIVE
Protein, ur: 30 mg/dL — AB
Specific Gravity, Urine: 1.028 (ref 1.005–1.030)
Squamous Epithelial / HPF: >50 — ABNORMAL HIGH (ref 0–5)
pH: 6 (ref 5.0–8.0)

## 2020-11-11 LAB — CBC WITH DIFFERENTIAL/PLATELET
Abs Immature Granulocytes: 0.06 10*3/uL (ref 0.00–0.07)
Basophils Absolute: 0.1 10*3/uL (ref 0.0–0.1)
Basophils Relative: 1 %
Eosinophils Absolute: 0.2 10*3/uL (ref 0.0–0.5)
Eosinophils Relative: 2 %
HCT: 41.9 % (ref 36.0–46.0)
Hemoglobin: 13 g/dL (ref 12.0–15.0)
Immature Granulocytes: 1 %
Lymphocytes Relative: 25 %
Lymphs Abs: 2.8 10*3/uL (ref 0.7–4.0)
MCH: 29.4 pg (ref 26.0–34.0)
MCHC: 31 g/dL (ref 30.0–36.0)
MCV: 94.8 fL (ref 80.0–100.0)
Monocytes Absolute: 0.8 10*3/uL (ref 0.1–1.0)
Monocytes Relative: 7 %
Neutro Abs: 7.2 10*3/uL (ref 1.7–7.7)
Neutrophils Relative %: 64 %
Platelets: 308 10*3/uL (ref 150–400)
RBC: 4.42 MIL/uL (ref 3.87–5.11)
RDW: 12.3 % (ref 11.5–15.5)
WBC: 11.2 10*3/uL — ABNORMAL HIGH (ref 4.0–10.5)
nRBC: 0 % (ref 0.0–0.2)

## 2020-11-11 LAB — I-STAT BETA HCG BLOOD, ED (MC, WL, AP ONLY): I-stat hCG, quantitative: <5 m[IU]/mL (ref <5)

## 2020-11-11 LAB — LIPASE, BLOOD: Lipase: 28 U/L (ref 11–51)

## 2020-11-11 NOTE — ED Provider Notes (Signed)
Emergency Medicine Provider Triage Evaluation Note  Kristy Nguyen , a 30 y.o. female  was evaluated in triage.  Pt complains of gradual onset, constant, achy, suprapubic abdominal pain for the past few days.  Also complains of dysuria and urinary frequency.  No history of UTIs.  Denies nausea, vomiting, fevers, chills. No discharge.   Review of Systems  Positive: + dysuria, urinary frequency, suprapubic pain Negative: - nausea, vomiting, fevers  Physical Exam  BP 121/79 (BP Location: Left Arm)   Pulse 91   Temp 98.6 F (37 C) (Oral)   Resp 18   LMP 10/16/2020   SpO2 100%  Gen:   Awake, no distress   Resp:  Normal effort  MSK:   Moves extremities without difficulty  Other:  No abdominal TTP  Medical Decision Making  Medically screening exam initiated at 7:30 PM.  Appropriate orders placed.  Kristy Nguyen was informed that the remainder of the evaluation will be completed by another provider, this initial triage assessment does not replace that evaluation, and the importance of remaining in the ED until their evaluation is complete.     Tanda Rockers, PA-C 11/11/20 1931    Bethann Berkshire, MD 11/11/20 2142

## 2020-11-11 NOTE — ED Notes (Signed)
Called patient x3 for vitals. No answer 

## 2020-11-11 NOTE — ED Notes (Signed)
Patient reports LLQ pain onset this week with dysuria , no emesis or diarrhea.

## 2020-11-12 ENCOUNTER — Emergency Department (HOSPITAL_COMMUNITY): Payer: BC Managed Care – PPO

## 2020-11-12 ENCOUNTER — Encounter (HOSPITAL_COMMUNITY): Payer: Self-pay | Admitting: Radiology

## 2020-11-12 DIAGNOSIS — R109 Unspecified abdominal pain: Secondary | ICD-10-CM | POA: Diagnosis not present

## 2020-11-12 LAB — WET PREP, GENITAL
Clue Cells Wet Prep HPF POC: NONE SEEN
Sperm: NONE SEEN
Trich, Wet Prep: NONE SEEN
Yeast Wet Prep HPF POC: NONE SEEN

## 2020-11-12 MED ORDER — DEXTROSE 5 % IV SOLN
500.0000 mg | Freq: Once | INTRAVENOUS | Status: AC
  Administered 2020-11-12: 500 mg via INTRAVENOUS
  Filled 2020-11-12: qty 500

## 2020-11-12 MED ORDER — CEFTRIAXONE SODIUM 500 MG IJ SOLR: 500.0000 mg | Freq: Once | INTRAMUSCULAR | Status: DC

## 2020-11-12 MED ORDER — CEPHALEXIN 500 MG PO CAPS: 1000.0000 mg | ORAL_CAPSULE | Freq: Two times a day (BID) | ORAL | 0 refills | Status: AC

## 2020-11-12 MED ORDER — IOHEXOL 300 MG/ML  SOLN
100.0000 mL | Freq: Once | INTRAMUSCULAR | Status: AC | PRN
  Administered 2020-11-12: 100 mL via INTRAVENOUS

## 2020-11-12 MED ORDER — DOXYCYCLINE MONOHYDRATE 100 MG PO TABS: 100.0000 mg | ORAL_TABLET | Freq: Two times a day (BID) | ORAL | 0 refills | Status: AC

## 2020-11-12 MED ORDER — DOXYCYCLINE HYCLATE 100 MG PO TABS
100.0000 mg | ORAL_TABLET | Freq: Once | ORAL | Status: AC
  Administered 2020-11-12: 100 mg via ORAL
  Filled 2020-11-12: qty 1

## 2020-11-12 NOTE — ED Provider Notes (Signed)
[facility] EMERGENCY DEPARTMENT Provider Note   CSN: [id number] Arrival date & time: 11/11/20  1804     History Chief Complaint  Patient presents with   Abdominal Pain    Kristy Nguyen is a 30 y.o. female with insignificant PMHx presented to the ED c/o of abdominal pain that has been ongoing for the past 2-3 days. Patient states the pain is intermittent throughout the day and is localized to the lower abdomin. Patient reports associated chills, N/V, heartburn, dysuria, urgency, frequency and vaginal irritation. Patient is sexually active, reports unprotected sex with one female partner. She is not on any form of contraception and endorses she is trying for pregnancy. Patient is G2P1 with LMP 10/16/20 irregular cycle.  Patient denies fever, CP, SOB, constipation, or diarrhea. Denies vaginal discharge or dyspareunia.   Spanish interpreter used: Emmanual ID#: [id number]  Abdominal Pain Associated symptoms: dysuria, nausea and vomiting   Associated symptoms: no chest pain, no chills, no constipation, no diarrhea, no fever, no shortness of breath and no vaginal discharge       Past Medical History:  Diagnosis Date   Allergy    Diabetes mellitus without complication (HCC)    gestational   Medical history non-contributory     Patient Active Problem List   Diagnosis Date Noted   Nexplanon insertion 10/28/2016   NSVD (normal spontaneous vaginal delivery) 09/17/2016   Oligohydramnios 09/14/2016   Group B streptococcal infection during pregnancy 09/11/2016   DM (diabetes mellitus), gestational 08/05/2016   Supervision of high-risk pregnancy 04/05/2016    Past Surgical History:  Procedure Laterality Date   INTRAUTERINE DEVICE (IUD) INSERTION     04-10-2020   NO PAST SURGERIES       OB History     Gravida  2   Para  1   Term  1   Preterm  0   AB  1   Living  1      SAB  1   IAB  0   Ectopic  0   Multiple  0   Live Births  1            Family History  Problem Relation Age of Onset   Diabetes Maternal Grandmother    Cancer Paternal Uncle        Brain    Social History   Tobacco Use   Smoking status: Some Days   Smokeless tobacco: Former  Building services engineer Use: Never used  Substance Use Topics   Alcohol use: No   Drug use: No    Home Medications Prior to Admission medications   Medication Sig Start Date End Date Taking? Authorizing Provider  ciprofloxacin (CIPRO) 500 MG tablet Take 1 tablet (500 mg total) by mouth 2 (two) times daily. 08/11/20   Genia Del, MD  doxycycline (VIBRAMYCIN) 100 MG capsule Take 1 capsule (100 mg total) by mouth 2 (two) times daily. 09/02/20   Genia Del, MD  ibuprofen (ADVIL,MOTRIN) 600 MG tablet Take 1 tablet (600 mg total) by mouth every 6 (six) hours as needed. 05/29/17   Emi Holes, PA-C  Multiple Vitamin (MULTIVITAMIN PO) Take by mouth.    [provider]    Allergies    Fish allergy and Penicillins  Review of Systems   Review of Systems  Constitutional:  Negative for chills and fever.  Respiratory:  Negative for shortness of breath.   Cardiovascular:  Negative for chest pain.  Gastrointestinal:  Positive for abdominal  pain, nausea and vomiting. Negative for constipation and diarrhea.  Genitourinary:  Positive for dysuria, frequency and urgency. Negative for dyspareunia and vaginal discharge.  Neurological:  Negative for headaches.   Physical Exam Updated Vital Signs BP 121/79   Pulse 85   Temp 98.4 F (36.9 C) (Oral)   Resp 14   LMP 10/16/2020   SpO2 100%   Physical Exam HENT:     Head: Normocephalic and atraumatic.  Cardiovascular:     Rate and Rhythm: Normal rate and regular rhythm.     Heart sounds: Normal heart sounds.  Pulmonary:     Effort: Pulmonary effort is normal.     Breath sounds: Normal breath sounds.  Abdominal:     General: Abdomen is flat.     Palpations: Abdomen is soft.     Tenderness: There is  abdominal tenderness in the suprapubic area.  Musculoskeletal:     Right lower leg: No edema.     Left lower leg: No edema.  Skin:    General: Skin is warm and dry.  Neurological:     Mental Status: She is alert, oriented to person, place, and time and easily aroused.  Psychiatric:        Behavior: Behavior is cooperative.    ED Results / Procedures / Treatments   Labs (all labs ordered are listed, but only abnormal results are displayed) Labs Reviewed  WET PREP, GENITAL - Abnormal; Notable for the following components:      Result Value   WBC, Wet Prep HPF POC MANY (*)    All other components within normal limits  URINALYSIS, ROUTINE W REFLEX MICROSCOPIC - Abnormal; Notable for the following components:   APPearance CLOUDY (*)    Ketones, ur 5 (*)    Protein, ur 30 (*)    Leukocytes,Ua MODERATE (*)    Bacteria, UA FEW (*)    Squamous Epithelial / LPF >50 (*)    All other components within normal limits  COMPREHENSIVE METABOLIC PANEL - Abnormal; Notable for the following components:   Glucose, Bld 106 (*)    Total Bilirubin 0.1 (*)    All other components within normal limits  CBC WITH DIFFERENTIAL/PLATELET - Abnormal; Notable for the following components:   WBC 11.2 (*)    All other components within normal limits  PREGNANCY, URINE  LIPASE, BLOOD  I-STAT BETA HCG BLOOD, ED ([facility], [facility], [facility] ONLY)  GC/CHLAMYDIA PROBE AMP ([place]) NOT AT [facility]    EKG None  Radiology No results found.  Procedures Procedures   Medications Ordered in ED Medications  cefTRIAXone (ROCEPHIN) injection 500 mg (has no administration in time range)  doxycycline (VIBRA-TABS) tablet 100 mg (has no administration in time range)    ED Course  I have reviewed the triage vital signs and the nursing notes.  Pertinent labs & imaging results that were available during my care of the patient were reviewed by me and considered in my medical decision making (see chart for details).    MDM  Rules/Calculators/A&P                           Abdominal pain Possible STI Patient presented with lower abdominal pain with associated chills, N/V, dysuria, urgency, frequency and vaginal irritation. Patient denies fevers. Patient is sexually active, unprotected sex with men, no contraception. On PE, RLQ tenderness on palpation. Pelvic exam positive for cervical motion tenderness, cervix not friable, and no adnexal tenderness. CT of  abd/pelvis w contrast pending. Vitals stable; CBC significant for leukocytosis; UA positive for leukocytes, few bacteria; pregnancy test negative, beta-hcg <5; Patient given doxycyline and ceftriaxone for STI treatment since she is symptomatic. Wet prep/GC prob pending. Final Clinical Impression(s) / ED Diagnoses Final diagnoses:  None    Rx / DC Orders ED Discharge Orders     None        Dellis Filbert, MD 11/12/20 1057    Milagros Loll, MD 11/13/20 [phone]

## 2020-11-12 NOTE — Discharge Instructions (Addendum)
Please take antibiotics as prescribed on the bottle  You are being treated for urinary tract infection and sexually transmitted infection  Follow up with your primary care doctor in two weeks to make sure everything is okay.  CT scan of your stomach was normal

## 2020-11-13 LAB — GC/CHLAMYDIA PROBE AMP (~~LOC~~) NOT AT ARMC
Chlamydia: NEGATIVE
Comment: NEGATIVE
Comment: NORMAL
Neisseria Gonorrhea: NEGATIVE

## 2020-11-27 ENCOUNTER — Ambulatory Visit (INDEPENDENT_AMBULATORY_CARE_PROVIDER_SITE_OTHER): Payer: BC Managed Care – PPO | Admitting: Nurse Practitioner

## 2020-11-27 ENCOUNTER — Encounter: Payer: Self-pay | Admitting: Anesthesiology

## 2020-11-27 ENCOUNTER — Other Ambulatory Visit: Payer: Self-pay

## 2020-11-27 ENCOUNTER — Encounter: Payer: Self-pay | Admitting: Nurse Practitioner

## 2020-11-27 VITALS — BP 118/76

## 2020-11-27 DIAGNOSIS — N912 Amenorrhea, unspecified: Secondary | ICD-10-CM | POA: Diagnosis not present

## 2020-11-27 DIAGNOSIS — Z3201 Encounter for pregnancy test, result positive: Secondary | ICD-10-CM

## 2020-11-27 DIAGNOSIS — Z3A01 Less than 8 weeks gestation of pregnancy: Secondary | ICD-10-CM

## 2020-11-27 LAB — PREGNANCY, URINE: Preg Test, Ur: POSITIVE — AB

## 2020-11-27 NOTE — Progress Notes (Signed)
   Acute Office Visit  Subjective:    Patient ID: Kristy Nguyen, female    DOB: 08/23/1990, 30 y.o.   MRN: [id number]   HPI 30 y.o. presents today for positive home UPT. LMP 10/16/2020. Paragard removed 08/2020. Happy with pregnancy. Supportive partner. Taking prenatal vitamin. Denies bleeding. Having occasional mild cramping.    Review of Systems  Constitutional: Negative.   Genitourinary: Negative.       Objective:    Physical Exam Constitutional:      Appearance: Normal appearance.  GU: Not indicated  BP 118/76   LMP 10/16/2020  Wt Readings from Last 3 Encounters:  04/29/20 202 lb (91.6 kg)  04/10/20 199 lb (90.3 kg)  03/10/20 199 lb (90.3 kg)   UPT positive     Assessment & Plan:   Problem List Items Addressed This Visit   None Visit Diagnoses     Less than [redacted] weeks gestation of pregnancy    -  Primary   Amenorrhea       Relevant Orders   Pregnancy, urine      Plan: Congratulated on pregnancy. Continue PNV. Discussed pregnancy safe behaviors and red flag symptoms. Reassurance provided that mild cramping is normal during first trimester.  Provided her with local OB practices. All questions answered.      Olivia Mackie DNP, 2:30 PM 11/27/2020

## 2020-11-30 ENCOUNTER — Encounter (HOSPITAL_BASED_OUTPATIENT_CLINIC_OR_DEPARTMENT_OTHER): Payer: Self-pay | Admitting: *Deleted

## 2020-11-30 ENCOUNTER — Emergency Department (HOSPITAL_BASED_OUTPATIENT_CLINIC_OR_DEPARTMENT_OTHER): Payer: BC Managed Care – PPO

## 2020-11-30 ENCOUNTER — Other Ambulatory Visit: Payer: Self-pay

## 2020-11-30 DIAGNOSIS — B9689 Other specified bacterial agents as the cause of diseases classified elsewhere: Secondary | ICD-10-CM | POA: Insufficient documentation

## 2020-11-30 DIAGNOSIS — O23591 Infection of other part of genital tract in pregnancy, first trimester: Secondary | ICD-10-CM | POA: Diagnosis not present

## 2020-11-30 DIAGNOSIS — R102 Pelvic and perineal pain: Secondary | ICD-10-CM | POA: Insufficient documentation

## 2020-11-30 DIAGNOSIS — Z3A01 Less than 8 weeks gestation of pregnancy: Secondary | ICD-10-CM | POA: Insufficient documentation

## 2020-11-30 DIAGNOSIS — O26891 Other specified pregnancy related conditions, first trimester: Secondary | ICD-10-CM | POA: Insufficient documentation

## 2020-11-30 DIAGNOSIS — N760 Acute vaginitis: Secondary | ICD-10-CM | POA: Insufficient documentation

## 2020-11-30 LAB — CBC
HCT: 41.5 % (ref 36.0–46.0)
Hemoglobin: 13.8 g/dL (ref 12.0–15.0)
MCH: 30 pg (ref 26.0–34.0)
MCHC: 33.3 g/dL (ref 30.0–36.0)
MCV: 90.2 fL (ref 80.0–100.0)
Platelets: 360 10*3/uL (ref 150–400)
RBC: 4.6 MIL/uL (ref 3.87–5.11)
RDW: 12.5 % (ref 11.5–15.5)
WBC: 13.8 10*3/uL — ABNORMAL HIGH (ref 4.0–10.5)
nRBC: 0 % (ref 0.0–0.2)

## 2020-11-30 LAB — COMPREHENSIVE METABOLIC PANEL
ALT: 18 U/L (ref 0–44)
AST: 18 U/L (ref 15–41)
Albumin: 4.4 g/dL (ref 3.5–5.0)
Alkaline Phosphatase: 86 U/L (ref 38–126)
Anion gap: 9 (ref 5–15)
BUN: 12 mg/dL (ref 6–20)
CO2: 24 mmol/L (ref 22–32)
Calcium: 10 mg/dL (ref 8.9–10.3)
Chloride: 100 mmol/L (ref 98–111)
Creatinine, Ser: 0.63 mg/dL (ref 0.44–1.00)
GFR, Estimated: >60 mL/min (ref >60)
Glucose, Bld: 107 mg/dL — ABNORMAL HIGH (ref 70–99)
Potassium: 4.3 mmol/L (ref 3.5–5.1)
Sodium: 133 mmol/L — ABNORMAL LOW (ref 135–145)
Total Bilirubin: 0.4 mg/dL (ref 0.3–1.2)
Total Protein: 8.4 g/dL — ABNORMAL HIGH (ref 6.5–8.1)

## 2020-11-30 LAB — URINALYSIS, MICROSCOPIC (REFLEX)

## 2020-11-30 LAB — URINALYSIS, ROUTINE W REFLEX MICROSCOPIC
Bilirubin Urine: NEGATIVE
Glucose, UA: NEGATIVE mg/dL
Ketones, ur: NEGATIVE mg/dL
Nitrite: NEGATIVE
Specific Gravity, Urine: 1.03 (ref 1.005–1.030)
pH: 5.5 (ref 5.0–8.0)

## 2020-11-30 LAB — HCG, QUANTITATIVE, PREGNANCY: hCG, Beta Chain, Quant, S: 21830 m[IU]/mL — ABNORMAL HIGH (ref <5)

## 2020-11-30 NOTE — ED Triage Notes (Signed)
Lower abdominal pain that started yesterday. Pt reports +urine pregnancy. LMP 9/2. No vaginal bleeding or discharge.

## 2020-12-01 ENCOUNTER — Emergency Department (HOSPITAL_BASED_OUTPATIENT_CLINIC_OR_DEPARTMENT_OTHER)
Admission: EM | Admit: 2020-12-01 | Discharge: 2020-12-01 | Disposition: A | Discharge status: Home / Self Care | Payer: BC Managed Care – PPO | Attending: Emergency Medicine | Admitting: Emergency Medicine

## 2020-12-01 ENCOUNTER — Encounter: Payer: Self-pay | Admitting: Nurse Practitioner

## 2020-12-01 DIAGNOSIS — O26899 Other specified pregnancy related conditions, unspecified trimester: Secondary | ICD-10-CM

## 2020-12-01 DIAGNOSIS — R102 Pelvic and perineal pain: Secondary | ICD-10-CM | POA: Diagnosis not present

## 2020-12-01 DIAGNOSIS — Z3A01 Less than 8 weeks gestation of pregnancy: Secondary | ICD-10-CM | POA: Diagnosis not present

## 2020-12-01 DIAGNOSIS — B9689 Other specified bacterial agents as the cause of diseases classified elsewhere: Secondary | ICD-10-CM

## 2020-12-01 DIAGNOSIS — O26891 Other specified pregnancy related conditions, first trimester: Secondary | ICD-10-CM | POA: Diagnosis not present

## 2020-12-01 LAB — WET PREP, GENITAL
Sperm: NONE SEEN
Trich, Wet Prep: NONE SEEN
Yeast Wet Prep HPF POC: NONE SEEN

## 2020-12-01 MED ORDER — SODIUM CHLORIDE 0.9 % IV BOLUS
1000.0000 mL | Freq: Once | INTRAVENOUS | Status: AC
  Administered 2020-12-01: 1000 mL via INTRAVENOUS

## 2020-12-01 MED ORDER — CEPHALEXIN 500 MG PO CAPS: 500.0000 mg | ORAL_CAPSULE | Freq: Three times a day (TID) | ORAL | 0 refills | Status: DC

## 2020-12-01 MED ORDER — METRONIDAZOLE 500 MG PO TABS
500.0000 mg | ORAL_TABLET | Freq: Two times a day (BID) | ORAL | 0 refills | Status: DC
Start: 2020-12-01 — End: 2020-12-22

## 2020-12-01 MED ORDER — ONDANSETRON HCL 4 MG/2ML IJ SOLN
4.0000 mg | Freq: Once | INTRAMUSCULAR | Status: AC
  Administered 2020-12-01: 4 mg via INTRAVENOUS
  Filled 2020-12-01: qty 2

## 2020-12-01 NOTE — ED Provider Notes (Signed)
[facility] EMERGENCY DEPARTMENT Provider Note   CSN: [id number] Arrival date & time: 11/30/20  2206     History Chief Complaint  Patient presents with   Abdominal Pain    Kristy Nguyen is a 30 y.o. female.  Patient with lower abdominal pain and cramping since yesterday.  Reports positive pregnancy test at home does not know how far along she is.  Believes her last menstrual cycle was September 2 but her periods are irregular.  She has cramping lower abdominal pain that comes and goes similar to her menses.  No vaginal bleeding or discharge.  Four episodes of vomiting today.  No diarrhea.  No pain with urination or blood in the urine.  No chest pain or shortness of breath.  Taking Tylenol without relief.  She has been seen by OB without had ultrasound with this pregnancy This is her second pregnancy.  History of 1 miscarriage in the past and this feels similar  The history is provided by the patient.  Abdominal Pain Associated symptoms: no cough, no dysuria, no fever, no hematuria, no nausea, no shortness of breath, no vaginal bleeding, no vaginal discharge and no vomiting       History reviewed. No pertinent past medical history.  There are no problems to display for this patient.   History reviewed. No pertinent surgical history.   OB History     Gravida  1   Para      Term      Preterm      AB      Living         SAB      IAB      Ectopic      Multiple      Live Births              No family history on file.     Home Medications Prior to Admission medications   Not on File    Allergies    Fish allergy and Penicillins  Review of Systems   Review of Systems  Constitutional:  Negative for activity change, appetite change and fever.  HENT:  Negative for congestion and rhinorrhea.   Respiratory:  Negative for cough, chest tightness and shortness of breath.   Gastrointestinal:  Positive for abdominal pain. Negative for  nausea and vomiting.  Genitourinary:  Negative for dysuria, hematuria, vaginal bleeding and vaginal discharge.  Musculoskeletal:  Negative for arthralgias and myalgias.  Skin:  Negative for rash.  Neurological:  Negative for dizziness, weakness and headaches.   all other systems are negative except as noted in the HPI and PMH.   Physical Exam Updated Vital Signs BP 107/89 (BP Location: Right Arm)   Pulse 83   Temp 98.8 F (37.1 C) (Oral)   Resp 16   Ht 5\' 2"  (1.575 m)   Wt 90.7 kg   LMP 10/16/2020   SpO2 99%   BMI 36.58 kg/m   Physical Exam Vitals and nursing note reviewed.  Constitutional:      General: She is not in acute distress.    Appearance: She is well-developed. She is obese. She is not ill-appearing.  HENT:     Head: Normocephalic and atraumatic.     Mouth/Throat:     Pharynx: No oropharyngeal exudate.  Eyes:     Conjunctiva/sclera: Conjunctivae normal.     Pupils: Pupils are equal, round, and reactive to light.  Neck:     Comments: No meningismus. Cardiovascular:  Rate and Rhythm: Normal rate and regular rhythm.     Heart sounds: Normal heart sounds. No murmur heard. Pulmonary:     Effort: Pulmonary effort is normal. No respiratory distress.     Breath sounds: Normal breath sounds.  Abdominal:     Palpations: Abdomen is soft.     Tenderness: There is abdominal tenderness. There is no guarding or rebound.     Comments: Minimal suprapubic tenderness, no guarding or rebound.  No right lower quadrant tender  Genitourinary:    Comments: Robin EMT present for chaperone.  Normal external genitalia.  Scant discharge in vaginal vault.  No CMT.  Midline uterine tenderness, no lateralized adnexal tenderness. Musculoskeletal:        General: No tenderness. Normal range of motion.     Cervical back: Normal range of motion and neck supple.  Skin:    General: Skin is warm.  Neurological:     Mental Status: She is alert and oriented to person, place, and time.      Cranial Nerves: No cranial nerve deficit.     Motor: No abnormal muscle tone.     Coordination: Coordination normal.     Comments:  5/5 strength throughout. CN 2-12 intact.Equal grip strength.   Psychiatric:        Behavior: Behavior normal.    ED Results / Procedures / Treatments   Labs (all labs ordered are listed, but only abnormal results are displayed) Labs Reviewed  WET PREP, GENITAL - Abnormal; Notable for the following components:      Result Value   Clue Cells Wet Prep HPF POC PRESENT (*)    WBC, Wet Prep HPF POC MANY (*)    All other components within normal limits  COMPREHENSIVE METABOLIC PANEL - Abnormal; Notable for the following components:   Sodium 133 (*)    Glucose, Bld 107 (*)    Total Protein 8.4 (*)    All other components within normal limits  CBC - Abnormal; Notable for the following components:   WBC 13.8 (*)    All other components within normal limits  URINALYSIS, ROUTINE W REFLEX MICROSCOPIC - Abnormal; Notable for the following components:   APPearance HAZY (*)    Hgb urine dipstick TRACE (*)    Protein, ur TRACE (*)    Leukocytes,Ua TRACE (*)    All other components within normal limits  HCG, QUANTITATIVE, PREGNANCY - Abnormal; Notable for the following components:   hCG, Beta Chain, Quant, S 21,830 (*)    All other components within normal limits  URINALYSIS, MICROSCOPIC (REFLEX) - Abnormal; Notable for the following components:   Bacteria, UA MANY (*)    All other components within normal limits  URINE CULTURE  GC/CHLAMYDIA PROBE AMP ([place]) NOT AT [facility]    EKG None  Radiology US OB Comp Less 14 Wks  Result Date: 12/01/2020 CLINICAL DATA:  Pelvic pain and positive pregnancy test EXAM: OBSTETRIC <14 WK ULTRASOUND TECHNIQUE: Transabdominal ultrasound was performed for evaluation of the gestation as well as the maternal uterus and adnexal regions. COMPARISON:  None. FINDINGS: Intrauterine gestational sac: Present Yolk sac:  Present  Embryo:  Present Cardiac Activity: Present Heart Rate: 157 bpm CRL:   3.2 mm   6 w 0 d                  [place] EDC: 07/26/2021 Subchorionic hemorrhage:  None visualized. Maternal uterus/adnexae: Right ovary is within normal limits. Left ovary is not well visualized on this  exam. IMPRESSION: Single live intrauterine gestation at 6 weeks. No acute abnormality is noted. Electronically Signed   By: Alcide Clever M.D.   On: 12/01/2020 00:16    Procedures Procedures   Medications Ordered in ED Medications  sodium chloride 0.9 % bolus 1,000 mL (has no administration in time range)  ondansetron (ZOFRAN) injection 4 mg (has no administration in time range)    ED Course  I have reviewed the triage vital signs and the nursing notes.  Pertinent labs & imaging results that were available during my care of the patient were reviewed by me and considered in my medical decision making (see chart for details).    MDM Rules/Calculators/A&P                          Positive pregnancy test with lower abdominal pain for the past 2 days.  Vital stable, no distress.  Ultrasound shows a viable 6-week IUP Pyuria on UA.  Will send for culture  Pelvic exam as above.  No bleeding.  Vitals remained stable.  Results discussed with patient that she has a viable IUP at this time but cannot predict whether this will be a successful pregnancy or not.  Treat for bacterial vaginosis as well as pyuria in setting of pregnancy.  Culture is pending.  Abdomen soft.  Low suspicion for ovarian torsion or TOA.  Patient tolerating p.o. and ambulatory.  No vomiting throughout ED course. Follow-up with her OB doctor.  Return precautions discussed Final Clinical Impression(s) / ED Diagnoses Final diagnoses:  Pelvic pain in pregnancy  Bacterial vaginosis    Rx / DC Orders ED Discharge Orders     None        Cree Napoli, Jeannett Senior, MD 12/01/20 [phone]

## 2020-12-01 NOTE — Discharge Instructions (Signed)
Ultrasound today shows a 6 weeks pregnancy growing in the right place.  Take the antibiotics as prescribed for bacterial vaginosis and a questionable urinary tract infection.  Follow-up with your gynecologist.  Return to the ED with worsening pain, bleeding, other concerns

## 2020-12-02 LAB — URINE CULTURE

## 2020-12-02 LAB — GC/CHLAMYDIA PROBE AMP (~~LOC~~) NOT AT ARMC
Chlamydia: NEGATIVE
Comment: NEGATIVE
Comment: NORMAL
Neisseria Gonorrhea: NEGATIVE

## 2020-12-22 ENCOUNTER — Inpatient Hospital Stay (HOSPITAL_COMMUNITY)
Admission: AD | Admit: 2020-12-22 | Discharge: 2020-12-22 | Disposition: A | Discharge status: Home / Self Care | Payer: BC Managed Care – PPO | Attending: Obstetrics and Gynecology | Admitting: Obstetrics and Gynecology

## 2020-12-22 ENCOUNTER — Encounter (HOSPITAL_COMMUNITY): Payer: Self-pay | Admitting: *Deleted

## 2020-12-22 ENCOUNTER — Emergency Department (HOSPITAL_COMMUNITY)
Admission: EM | Admit: 2020-12-22 | Discharge: 2020-12-22 | Discharge status: Against Medical Advice | Payer: BC Managed Care – PPO

## 2020-12-22 DIAGNOSIS — Z3A09 9 weeks gestation of pregnancy: Secondary | ICD-10-CM | POA: Diagnosis not present

## 2020-12-22 DIAGNOSIS — Z880 Allergy status to penicillin: Secondary | ICD-10-CM | POA: Insufficient documentation

## 2020-12-22 DIAGNOSIS — O210 Mild hyperemesis gravidarum: Secondary | ICD-10-CM | POA: Insufficient documentation

## 2020-12-22 DIAGNOSIS — O219 Vomiting of pregnancy, unspecified: Secondary | ICD-10-CM | POA: Diagnosis not present

## 2020-12-22 LAB — URINALYSIS, ROUTINE W REFLEX MICROSCOPIC
Bilirubin Urine: NEGATIVE
Glucose, UA: NEGATIVE mg/dL
Hgb urine dipstick: NEGATIVE
Ketones, ur: 80 mg/dL — AB
Nitrite: NEGATIVE
Protein, ur: 30 mg/dL — AB
Specific Gravity, Urine: 1.021 (ref 1.005–1.030)
pH: 6 (ref 5.0–8.0)

## 2020-12-22 MED ORDER — METOCLOPRAMIDE HCL 5 MG/ML IJ SOLN
10.0000 mg | Freq: Once | INTRAMUSCULAR | Status: AC
  Administered 2020-12-22: 10 mg via INTRAVENOUS
  Filled 2020-12-22: qty 2

## 2020-12-22 MED ORDER — LACTATED RINGERS IV BOLUS
1000.0000 mL | Freq: Once | INTRAVENOUS | Status: AC
  Administered 2020-12-22: 1000 mL via INTRAVENOUS

## 2020-12-22 MED ORDER — FAMOTIDINE IN NACL 20-0.9 MG/50ML-% IV SOLN
20.0000 mg | Freq: Once | INTRAVENOUS | Status: AC
  Administered 2020-12-22: 20 mg via INTRAVENOUS
  Filled 2020-12-22: qty 50

## 2020-12-22 MED ORDER — METOCLOPRAMIDE HCL 10 MG PO TABS: 10.0000 mg | ORAL_TABLET | Freq: Four times a day (QID) | ORAL | 0 refills | Status: DC

## 2020-12-22 MED ORDER — FAMOTIDINE 20 MG PO TABS: 20.0000 mg | ORAL_TABLET | Freq: Two times a day (BID) | ORAL | 0 refills | Status: DC

## 2020-12-22 NOTE — Discharge Instructions (Signed)
[facility]   [facility]             [address], [place], [place] [address] [phone]  [facility]                                                             [address], [address], [place], [place], [address] [phone]  [facility] [place] [facility]                                    [address], [address], [place], [place], [address] [phone]  [facility] [place] [address], [address], [place], [place], [address] [phone]  [facility] [place] [facility]                                 [address], [place], [place], [address] [phone]  [facility]                                    [address], [place], [place], [address] [phone]  [facility] [address], [address], [place], [place], [address]                               [facility] [address], [place], [place], [address] [phone]  [facility]         Phone: [phone]  [facility]      Phone: [phone]   [facility]      Phone: [phone]  [facility]         Phone: [phone]   [facility]    Phone: [phone]  [facility]      Phone: [phone]  [facility]      Phone: [phone]  [facility]        Phone: [phone]  [facility]      Phone: [phone]  

## 2020-12-22 NOTE — MAU Note (Signed)
Pain in her belly for 3 days, comes and goes.  Has been in bed for 4 days.  Can't keep anything down, throws up everything.  No food or water in her mouth, she throws up. Feels so cold, did not check her temp.

## 2020-12-22 NOTE — ED Notes (Signed)
Called for vitals/triage x6

## 2020-12-22 NOTE — MAU Provider Note (Signed)
History     CSN: [id number]  Arrival date and time: 12/22/20 1212   None    Chief Complaint  Patient presents with   Emesis   Abdominal Pain   HPI Kristy Nguyen is a 30 y.o. G3P1011 at [redacted]w[redacted]d here for nausea and vomiting since Friday. Patient reports that every time she tries to eat or drink something, she vomits. She also reports increase in heartburn. Has not taken any meds. She denies any recent sick contacts. She also endorses intermittent left sided pain when she moves that has been ongoing. She denies vaginal bleeding, vaginal discharge, or urinary s/s.    OB History     Gravida  3   Para  1   Term  1   Preterm  0   AB  1   Living  1      SAB  1   IAB  0   Ectopic  0   Multiple      Live Births  1           Past Medical History:  Diagnosis Date   Allergy    Diabetes mellitus without complication (HCC)    gestational   Medical history non-contributory     Past Surgical History:  Procedure Laterality Date   INTRAUTERINE DEVICE (IUD) INSERTION     04-10-2020   NO PAST SURGERIES      Family History  Problem Relation Age of Onset   Diabetes Maternal Grandmother    Cancer Paternal Uncle        Brain    Social History   Tobacco Use   Smoking status: Never   Smokeless tobacco: Former  Building services engineer Use: Never used  Substance Use Topics   Alcohol use: No   Drug use: No    Allergies:  Allergies  Allergen Reactions   Fish Allergy Swelling   Fish Allergy Rash   Penicillins Rash and Other (See Comments)    Has patient had a PCN reaction causing immediate rash, facial/tongue/throat swelling, SOB or lightheadedness with hypotension: No Has patient had a PCN reaction causing severe rash involving mucus membranes or skin necrosis: No Has patient had a PCN reaction that required hospitalization No Has patient had a PCN reaction occurring within the last 10 years: No If all of the above answers are "NO", then may proceed with  Cephalosporin use.   Penicillins Swelling and Rash    No medications prior to admission.    Review of Systems  Constitutional: Negative.   Respiratory: Negative.    Cardiovascular: Negative.   Gastrointestinal:  Positive for nausea and vomiting.  Genitourinary: Negative.   Musculoskeletal: Negative.   Neurological:  Positive for headaches.  Psychiatric/Behavioral: Negative.    Physical Exam   Blood pressure 121/75, pulse 68, temperature 98.8 F (37.1 C), temperature source Oral, resp. rate 18, height 5\' 2"  (1.575 m), weight 93.8 kg, last menstrual period 10/16/2020, SpO2 98 %, unknown if currently breastfeeding.  Physical Exam Constitutional:      Appearance: She is obese.  HENT:     Head: Normocephalic and atraumatic.  Cardiovascular:     Rate and Rhythm: Normal rate.  Pulmonary:     Effort: Pulmonary effort is normal.  Abdominal:     Palpations: Abdomen is soft.     Tenderness: There is no abdominal tenderness.  Neurological:     General: No focal deficit present.     Mental Status: She is alert and oriented to  person, place, and time.  Psychiatric:        Mood and Affect: Mood normal. Mood is not anxious.        Behavior: Behavior normal.    MAU Course  Procedures  MDM UA >80 ketones LR bolus x2 Reglan and Pepcid IV PO challenge; able to tolerate apple juice. Patient reports that she feels much better  Assessment and Plan  [redacted] weeks gestation of pregnancy Nausea and vomiting   - Discharged home in stable condition - Reglan and Pepcid sent to pharmacy - List of OBGYN providers given to patient. Encouraged to establish [facility] as soon as possible - Strict warning precautions given. Return to MAU as needed    Brand Males, CNM 12/22/2020, 5:35 PM

## 2021-01-14 ENCOUNTER — Encounter (HOSPITAL_COMMUNITY): Payer: Self-pay | Admitting: Obstetrics and Gynecology

## 2021-01-14 ENCOUNTER — Inpatient Hospital Stay (HOSPITAL_COMMUNITY)
Admission: AD | Admit: 2021-01-14 | Discharge: 2021-01-14 | Disposition: A | Discharge status: Home / Self Care | Payer: BC Managed Care – PPO | Attending: Obstetrics and Gynecology | Admitting: Obstetrics and Gynecology

## 2021-01-14 DIAGNOSIS — Z3A12 12 weeks gestation of pregnancy: Secondary | ICD-10-CM | POA: Diagnosis not present

## 2021-01-14 DIAGNOSIS — O26852 Spotting complicating pregnancy, second trimester: Secondary | ICD-10-CM

## 2021-01-14 DIAGNOSIS — F17210 Nicotine dependence, cigarettes, uncomplicated: Secondary | ICD-10-CM | POA: Diagnosis not present

## 2021-01-14 DIAGNOSIS — O26851 Spotting complicating pregnancy, first trimester: Secondary | ICD-10-CM | POA: Insufficient documentation

## 2021-01-14 DIAGNOSIS — Z880 Allergy status to penicillin: Secondary | ICD-10-CM | POA: Insufficient documentation

## 2021-01-14 DIAGNOSIS — O99331 Smoking (tobacco) complicating pregnancy, first trimester: Secondary | ICD-10-CM | POA: Insufficient documentation

## 2021-01-14 DIAGNOSIS — O26899 Other specified pregnancy related conditions, unspecified trimester: Secondary | ICD-10-CM

## 2021-01-14 DIAGNOSIS — R109 Unspecified abdominal pain: Secondary | ICD-10-CM | POA: Diagnosis not present

## 2021-01-14 DIAGNOSIS — R42 Dizziness and giddiness: Secondary | ICD-10-CM

## 2021-01-14 DIAGNOSIS — O26859 Spotting complicating pregnancy, unspecified trimester: Secondary | ICD-10-CM | POA: Diagnosis present

## 2021-01-14 LAB — URINALYSIS, ROUTINE W REFLEX MICROSCOPIC
Bilirubin Urine: NEGATIVE
Glucose, UA: NEGATIVE mg/dL
Ketones, ur: NEGATIVE mg/dL
Nitrite: NEGATIVE
Protein, ur: NEGATIVE mg/dL
Specific Gravity, Urine: 1.025 (ref 1.005–1.030)
pH: 6 (ref 5.0–8.0)

## 2021-01-14 LAB — URINALYSIS, MICROSCOPIC (REFLEX)

## 2021-01-14 NOTE — MAU Provider Note (Addendum)
Chief Complaint: No chief complaint on file.   Event Date/Time   First Provider Initiated Contact with Patient 01/14/21 2024        SUBJECTIVE HPI: Kristy Nguyen is a 30 y.o. G3P1011 at [redacted]w[redacted]d by LMP who presents to maternity admissions reporting pink spotting and mild pelvic cramping since yesterday.  Was seen in ED 11/30/20 and US showed single live IUP at 6 wks.  Marland KitchenHas had one SAB so is worried. States has occasional short bouts of vertigo/room spinning.  Lasts only a few minutes then goes away She denies vaginal itching/burning, urinary symptoms, h/a, n/v, or fever/chills.    Vaginal Bleeding The patient's primary symptoms include pelvic pain (mild intermittent cramping) and vaginal bleeding. The patient's pertinent negatives include no genital itching, genital lesions or genital odor. This is a new problem. The current episode started yesterday. The problem occurs intermittently. The pain is mild. She is pregnant. Associated symptoms include abdominal pain. Pertinent negatives include no diarrhea, dysuria, fever or frequency. The vaginal discharge was bloody. The vaginal bleeding is spotting. She has not been passing clots. She has not been passing tissue. Nothing aggravates the symptoms. She has tried nothing for the symptoms.  Abdominal Pain This is a new problem. The current episode started yesterday. The onset quality is gradual. The problem occurs intermittently. The pain is mild. The quality of the pain is cramping. Pertinent negatives include no diarrhea, dysuria, fever, frequency or myalgias. Nothing aggravates the pain. The pain is relieved by Nothing. She has tried nothing for the symptoms.  RN Note: PT SAYS SHE SAW A PINK DOT WHEN SHE WIPED YESTERDAY- 9A THEN SHE HAD ABD CRAMPING - STILL HAS SAME CRAMP- MORE WHEN SHE STANDS LAST SEX- LAST Friday  NO PNC YET- APPOINTMENT AT [facility] ON 01-19-2021    Past Medical History:  Diagnosis Date   Allergy    Diabetes mellitus without  complication (HCC)    gestational   Medical history non-contributory    Past Surgical History:  Procedure Laterality Date   INTRAUTERINE DEVICE (IUD) INSERTION     04-10-2020   NO PAST SURGERIES     Social History   Socioeconomic History   Marital status: Single    Spouse name: Not on file   Number of children: Not on file   Years of education: Not on file   Highest education level: Not on file  Occupational History   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   Social Determinants of Health   Financial Resource Strain: Not on file  Food Insecurity: Not on file  Transportation Needs: Not on file  Physical Activity: Not on file  Stress: Not on file  Social Connections: Not on file  Intimate Partner Violence: Not on file   No current facility-administered medications on file prior to encounter.   Current Outpatient Medications on File Prior to Encounter  Medication Sig Dispense Refill   famotidine (PEPCID) 20 MG  tablet Take 1 tablet (20 mg total) by mouth 2 (two) times daily. 30 tablet 0   metoCLOPramide (REGLAN) 10 MG tablet Take 1 tablet (10 mg total) by mouth every 6 (six) hours. 30 tablet 0   Multiple Vitamin (MULTIVITAMIN PO) Take by mouth.     Allergies  Allergen Reactions   Fish Allergy Swelling   Fish Allergy Rash   Penicillins Rash and Other (See Comments)    Has patient had a PCN reaction causing immediate rash, facial/tongue/throat swelling, SOB or lightheadedness with hypotension: No Has patient had a PCN  reaction causing severe rash involving mucus membranes or skin necrosis: No Has patient had a PCN reaction that required hospitalization No Has patient had a PCN reaction occurring within the last 10 years: No If all of the above answers are "NO", then may proceed with Cephalosporin use.   Penicillins Swelling and Rash    I have reviewed patient's Past Medical Hx, Surgical Hx, Family Hx, Social Hx, medications and allergies.   ROS:  Review of Systems  Constitutional:  Negative for fever.  Gastrointestinal:  Positive for abdominal pain. Negative for diarrhea.  Genitourinary:  Positive for pelvic pain (mild intermittent cramping) and vaginal bleeding. Negative for dysuria and frequency.  Musculoskeletal:  Negative for myalgias.  Review of Systems  Other systems negative   Physical Exam  Physical Exam Patient Vitals for the past 24 hrs:  BP Temp Temp src Pulse Resp Height Weight  01/14/21 1923 110/66 99.1 F (37.3 C) Oral 85 20 4\' 8"  (1.422 m) 92.2 kg   Constitutional: Well-developed, well-nourished female in no acute distress.  Cardiovascular: normal rate Respiratory: normal effort GI: Abd soft, non-tender.  MS: Extremities nontender, no edema, normal ROM Neurologic: Alert and oriented x 4.  GU: Neg CVAT.  PELVIC EXAM: Uterus nontender, no bleeding observed presently  FHT  150s per bedside US  LAB RESULTS Results for orders placed or performed during the hospital encounter of 01/14/21 (from the past 24 hour(s))  Urinalysis, Routine w reflex microscopic Urine, Clean Catch     Status: Abnormal   Collection Time: 01/14/21  7:31 PM  Result Value Ref Range   Color, Urine YELLOW YELLOW   APPearance CLEAR CLEAR   Specific Gravity, Urine 1.025 1.005 - 1.030   pH 6.0 5.0 - 8.0   Glucose, UA NEGATIVE NEGATIVE mg/dL   Hgb urine dipstick TRACE (A) NEGATIVE   Bilirubin Urine NEGATIVE NEGATIVE   Ketones, ur NEGATIVE NEGATIVE mg/dL   Protein, ur NEGATIVE NEGATIVE mg/dL   Nitrite  NEGATIVE NEGATIVE   Leukocytes,Ua TRACE (A) NEGATIVE   Blood type O+  IMAGING Pt informed that the ultrasound is considered a limited OB ultrasound and is not intended to be a complete ultrasound exam.  Patient also informed that the ultrasound is not being completed with the intent of assessing for fetal or placental anomalies or any pelvic abnormalities.  Explained that the purpose of today's ultrasound is to assess for fetal viability.  Patient acknowledges the purpose of the exam and the limitations of the study.    Gestational sac with single fetus visible, c/w 12 weeks FHR 150s Fetus very active Placenta lateral  Normal appearing amniotic sac  MAU Management/MDM: Ordered UA which is normal   Trace Hematuria c/w vaginal bleeding Bedside US done to verify viability.  Single live active fetus observed Patient is very relieved Discussed it is unclear what the source of spotting is, but since FHR is stable and we have seen fetal  progression in growth, it is probably of little consequence.   ASSESSMENT Single IUP at [redacted]w[redacted]d Spotting in early pregnancy Mild intermittent cramping  PLAN Discharge home Pelvic rest discussed Advised to keep New OB appt as scheduled  Pt stable at time of discharge. Encouraged to return here if she develops worsening of symptoms, increase in pain, fever, or other concerning symptoms.    Hansel Feinstein CNM, MSN Certified Nurse-Midwife 01/14/2021  8:24 PM

## 2021-01-14 NOTE — MAU Note (Signed)
PT SAYS SHE SAW A PINK DOT WHEN SHE WIPED YESTERDAY- 9A THEN SHE HAD ABD CRAMPING - STILL HAS SAME CRAMP- MORE WHEN SHE STANDS LAST SEX- LAST Friday  NO PNC YET- APPOINTMENT AT [facility] ON 01-19-2021

## 2021-01-15 DIAGNOSIS — O26859 Spotting complicating pregnancy, unspecified trimester: Secondary | ICD-10-CM | POA: Diagnosis present

## 2021-01-28 DIAGNOSIS — Z31430 Encounter of female for testing for genetic disease carrier status for procreative management: Secondary | ICD-10-CM | POA: Diagnosis not present

## 2021-01-28 DIAGNOSIS — Z23 Encounter for immunization: Secondary | ICD-10-CM | POA: Diagnosis not present

## 2021-01-28 DIAGNOSIS — Z3481 Encounter for supervision of other normal pregnancy, first trimester: Secondary | ICD-10-CM | POA: Diagnosis not present

## 2021-01-28 DIAGNOSIS — Z3482 Encounter for supervision of other normal pregnancy, second trimester: Secondary | ICD-10-CM | POA: Diagnosis not present

## 2021-01-28 DIAGNOSIS — N760 Acute vaginitis: Secondary | ICD-10-CM | POA: Diagnosis not present

## 2021-01-28 DIAGNOSIS — Z113 Encounter for screening for infections with a predominantly sexual mode of transmission: Secondary | ICD-10-CM | POA: Diagnosis not present

## 2021-01-28 LAB — OB RESULTS CONSOLE HGB/HCT, BLOOD
HCT: 37 (ref 29–41)
Hemoglobin: 11.9

## 2021-01-28 LAB — OB RESULTS CONSOLE GC/CHLAMYDIA
Chlamydia: NEGATIVE
Gonorrhea: NEGATIVE

## 2021-01-28 LAB — OB RESULTS CONSOLE ABO/RH: RH Type: POSITIVE

## 2021-01-28 LAB — OB RESULTS CONSOLE PLATELET COUNT: Platelets: 272

## 2021-01-28 LAB — OB RESULTS CONSOLE ANTIBODY SCREEN: Antibody Screen: NEGATIVE

## 2021-01-28 LAB — HEPATITIS C ANTIBODY: HCV Ab: NEGATIVE

## 2021-01-28 LAB — OB RESULTS CONSOLE HEPATITIS B SURFACE ANTIGEN: Hepatitis B Surface Ag: NEGATIVE

## 2021-01-28 LAB — OB RESULTS CONSOLE RUBELLA ANTIBODY, IGM: Rubella: IMMUNE

## 2021-01-28 LAB — OB RESULTS CONSOLE RPR: RPR: NONREACTIVE

## 2021-01-28 LAB — OB RESULTS CONSOLE HIV ANTIBODY (ROUTINE TESTING): HIV: NONREACTIVE

## 2021-01-28 LAB — GLUCOSE TOLERANCE, 1 HOUR: Glucose, 1 Hour GTT: 170

## 2021-01-29 DIAGNOSIS — Z20828 Contact with and (suspected) exposure to other viral communicable diseases: Secondary | ICD-10-CM | POA: Diagnosis not present

## 2021-02-01 LAB — CYTOLOGY - PAP: Pap: NEGATIVE

## 2021-02-02 ENCOUNTER — Encounter (HOSPITAL_COMMUNITY): Payer: Self-pay | Admitting: Obstetrics and Gynecology

## 2021-02-02 ENCOUNTER — Inpatient Hospital Stay (HOSPITAL_COMMUNITY)
Admission: AD | Admit: 2021-02-02 | Discharge: 2021-02-02 | Disposition: A | Discharge status: Home / Self Care | Payer: BC Managed Care – PPO | Attending: Obstetrics and Gynecology | Admitting: Obstetrics and Gynecology

## 2021-02-02 ENCOUNTER — Other Ambulatory Visit: Payer: Self-pay

## 2021-02-02 DIAGNOSIS — B9689 Other specified bacterial agents as the cause of diseases classified elsewhere: Secondary | ICD-10-CM | POA: Insufficient documentation

## 2021-02-02 DIAGNOSIS — O24419 Gestational diabetes mellitus in pregnancy, unspecified control: Secondary | ICD-10-CM | POA: Diagnosis not present

## 2021-02-02 DIAGNOSIS — O23592 Infection of other part of genital tract in pregnancy, second trimester: Secondary | ICD-10-CM | POA: Diagnosis not present

## 2021-02-02 DIAGNOSIS — Z3A15 15 weeks gestation of pregnancy: Secondary | ICD-10-CM | POA: Insufficient documentation

## 2021-02-02 DIAGNOSIS — Z0371 Encounter for suspected problem with amniotic cavity and membrane ruled out: Secondary | ICD-10-CM | POA: Diagnosis not present

## 2021-02-02 DIAGNOSIS — F17210 Nicotine dependence, cigarettes, uncomplicated: Secondary | ICD-10-CM | POA: Diagnosis not present

## 2021-02-02 DIAGNOSIS — O99332 Smoking (tobacco) complicating pregnancy, second trimester: Secondary | ICD-10-CM | POA: Diagnosis not present

## 2021-02-02 DIAGNOSIS — Z3481 Encounter for supervision of other normal pregnancy, first trimester: Secondary | ICD-10-CM | POA: Diagnosis not present

## 2021-02-02 DIAGNOSIS — N898 Other specified noninflammatory disorders of vagina: Secondary | ICD-10-CM | POA: Diagnosis not present

## 2021-02-02 DIAGNOSIS — Z3482 Encounter for supervision of other normal pregnancy, second trimester: Secondary | ICD-10-CM | POA: Diagnosis not present

## 2021-02-02 DIAGNOSIS — N760 Acute vaginitis: Secondary | ICD-10-CM

## 2021-02-02 DIAGNOSIS — Z8759 Personal history of other complications of pregnancy, childbirth and the puerperium: Secondary | ICD-10-CM | POA: Diagnosis not present

## 2021-02-02 HISTORY — DX: Gestational diabetes mellitus in pregnancy, unspecified control: O24.419

## 2021-02-02 LAB — WET PREP, GENITAL
Sperm: NONE SEEN
Trich, Wet Prep: NONE SEEN
WBC, Wet Prep HPF POC: >=10 — AB (ref <10)
Yeast Wet Prep HPF POC: NONE SEEN

## 2021-02-02 LAB — GLUCOSE TOLERANCE, 3 HOURS
Glucose 1 Hour: 223
Glucose 2 Hour: 165
Glucose 3 Hour: 137
Glucose Fasting: 92

## 2021-02-02 LAB — OB RESULTS CONSOLE VARICELLA ZOSTER ANTIBODY, IGG: Varicella: IMMUNE

## 2021-02-02 LAB — POCT FERN TEST: POCT Fern Test: NEGATIVE

## 2021-02-02 MED ORDER — METRONIDAZOLE 500 MG PO TABS: 500.0000 mg | ORAL_TABLET | Freq: Two times a day (BID) | ORAL | 0 refills | Status: DC

## 2021-02-02 NOTE — MAU Provider Note (Signed)
History     CSN: [id number]  Arrival date and time: 02/02/21 1201   Event Date/Time   First Provider Initiated Contact with Patient 02/02/21 1339      Chief Complaint  Patient presents with   Rupture of Membranes   HPI Vedanshi Massaro is a 30 y.o. G3P1011 at [redacted]w[redacted]d who presents with vaginal discharge. Reports that since yesterday she has had several episodes of watery discharge. At one point she had it running down her legs. Has had some intermittent cramping as well. Rates pain 8/10. Hasn't treated symptoms. Nothing makes better or worse. Denies vaginal itching or irritation. No odor to discharge. Denies vaginal bleeding.   OB History     Gravida  3   Para  1   Term  1   Preterm  0   AB  1   Living  1      SAB  1   IAB  0   Ectopic  0   Multiple      Live Births  1           Past Medical History:  Diagnosis Date   Allergy    Diabetes mellitus without complication (HCC)    gestational   Gestational diabetes     Past Surgical History:  Procedure Laterality Date   NO PAST SURGERIES      Family History  Problem Relation Age of Onset   Diabetes Maternal Grandmother    Cancer Paternal Uncle        Brain    Social History   Tobacco Use   Smoking status: Some Days    Types: Cigarettes   Smokeless tobacco: Former  Building services engineer Use: Never used  Substance Use Topics   Alcohol use: No   Drug use: No    Allergies:  Allergies  Allergen Reactions   Fish Allergy Swelling   Fish Allergy Rash   Penicillins Rash and Other (See Comments)    Has patient had a PCN reaction causing immediate rash, facial/tongue/throat swelling, SOB or lightheadedness with hypotension: No Has patient had a PCN reaction causing severe rash involving mucus membranes or skin necrosis: No Has patient had a PCN reaction that required hospitalization No Has patient had a PCN reaction occurring within the last 10 years: No If all of the above answers are  "NO", then may proceed with Cephalosporin use.   Penicillins Swelling and Rash    Medications Prior to Admission  Medication Sig Dispense Refill Last Dose   Multiple Vitamin (MULTIVITAMIN PO) Take by mouth.   02/01/2021   famotidine (PEPCID) 20 MG tablet Take 1 tablet (20 mg total) by mouth 2 (two) times daily. 30 tablet 0    metoCLOPramide (REGLAN) 10 MG tablet Take 1 tablet (10 mg total) by mouth every 6 (six) hours. 30 tablet 0     Review of Systems  Constitutional: Negative.   Gastrointestinal:  Positive for abdominal pain.  Genitourinary:  Positive for vaginal discharge. Negative for dysuria and vaginal bleeding.  Physical Exam   Blood pressure 110/66, pulse 94, temperature 98.1 F (36.7 C), temperature source Oral, resp. rate 18, weight 91.9 kg, last menstrual period 10/16/2020, SpO2 100 %, unknown if currently breastfeeding.  Physical Exam Vitals and nursing note reviewed. Exam conducted with a chaperone present.  Constitutional:      General: She is not in acute distress.    Appearance: Normal appearance.  HENT:     Head: Normocephalic and atraumatic.  Eyes:     General: No scleral icterus.    Conjunctiva/sclera: Conjunctivae normal.     Pupils: Pupils are equal, round, and reactive to light.  Pulmonary:     Effort: Pulmonary effort is normal. No respiratory distress.  Abdominal:     Palpations: Abdomen is soft.     Tenderness: There is no abdominal tenderness.  Genitourinary:    Comments: Pelvic: NEFG, no bleeding. Moderate mount of thin white/gray discharge. No pooling of fluid. Cervix closed.  Skin:    General: Skin is warm and dry.  Neurological:     General: No focal deficit present.     Mental Status: She is alert.  Psychiatric:        Mood and Affect: Mood normal.        Behavior: Behavior normal.    MAU Course  Procedures Results for orders placed or performed during the hospital encounter of 02/02/21 (from the past 24 hour(s))  Wet prep, genital      Status: Abnormal   Collection Time: 02/02/21  2:06 PM   Specimen: PATH Cytology Cervicovaginal Ancillary Only  Result Value Ref Range   Yeast Wet Prep HPF POC NONE SEEN NONE SEEN   Trich, Wet Prep NONE SEEN NONE SEEN   Clue Cells Wet Prep HPF POC PRESENT (A) NONE SEEN   WBC, Wet Prep HPF POC >=10 (A) <10   Sperm NONE SEEN   POCT fern test     Status: Normal   Collection Time: 02/02/21  2:36 PM  Result Value Ref Range   POCT Fern Test Negative = intact amniotic membranes     MDM FHT present via doppler  SSE performed. No pooling of fluid. Has moderate amount of discharge. Wet prep positive for clue cells. Will tx for BV.  Fern slide negative Cervix closed  *[facility] provided Spanish interpreter used for this encounter* Assessment and Plan   1. Encounter for suspected PROM, with rupture of membranes not found  -no pooling & fern negative  2. Bacterial vaginosis  -rx flagyl -gc/ct pending  3. [redacted] weeks gestation of pregnancy      Judeth Horn 02/02/2021, 2:45 PM

## 2021-02-02 NOTE — MAU Note (Signed)
Presents stating she began leaking fluid yesterday.  States had LOF yesterday and this morning, states it's not enough fluid to warrant wearing a sanitary napkin.  Denies VB.

## 2021-02-03 LAB — GC/CHLAMYDIA PROBE AMP (~~LOC~~) NOT AT ARMC
Chlamydia: NEGATIVE
Comment: NEGATIVE
Comment: NORMAL
Neisseria Gonorrhea: NEGATIVE

## 2021-02-22 ENCOUNTER — Inpatient Hospital Stay (HOSPITAL_COMMUNITY)
Admission: AD | Admit: 2021-02-22 | Discharge: 2021-02-22 | Disposition: A | Discharge status: Home / Self Care | Payer: BC Managed Care – PPO | Attending: Obstetrics and Gynecology | Admitting: Obstetrics and Gynecology

## 2021-02-22 ENCOUNTER — Encounter (HOSPITAL_COMMUNITY): Payer: Self-pay | Admitting: Obstetrics and Gynecology

## 2021-02-22 DIAGNOSIS — O0992 Supervision of high risk pregnancy, unspecified, second trimester: Secondary | ICD-10-CM | POA: Diagnosis not present

## 2021-02-22 DIAGNOSIS — O26852 Spotting complicating pregnancy, second trimester: Secondary | ICD-10-CM | POA: Diagnosis not present

## 2021-02-22 DIAGNOSIS — O26859 Spotting complicating pregnancy, unspecified trimester: Secondary | ICD-10-CM

## 2021-02-22 DIAGNOSIS — R1032 Left lower quadrant pain: Secondary | ICD-10-CM | POA: Insufficient documentation

## 2021-02-22 DIAGNOSIS — Z3A18 18 weeks gestation of pregnancy: Secondary | ICD-10-CM

## 2021-02-22 DIAGNOSIS — O0990 Supervision of high risk pregnancy, unspecified, unspecified trimester: Secondary | ICD-10-CM

## 2021-02-22 DIAGNOSIS — O26892 Other specified pregnancy related conditions, second trimester: Secondary | ICD-10-CM | POA: Diagnosis not present

## 2021-02-22 DIAGNOSIS — N949 Unspecified condition associated with female genital organs and menstrual cycle: Secondary | ICD-10-CM

## 2021-02-22 DIAGNOSIS — R1031 Right lower quadrant pain: Secondary | ICD-10-CM | POA: Insufficient documentation

## 2021-02-22 DIAGNOSIS — Z8659 Personal history of other mental and behavioral disorders: Secondary | ICD-10-CM | POA: Insufficient documentation

## 2021-02-22 DIAGNOSIS — O99891 Other specified diseases and conditions complicating pregnancy: Secondary | ICD-10-CM

## 2021-02-22 LAB — URINALYSIS, ROUTINE W REFLEX MICROSCOPIC
Bilirubin Urine: NEGATIVE
Glucose, UA: NEGATIVE mg/dL
Hgb urine dipstick: NEGATIVE
Ketones, ur: NEGATIVE mg/dL
Nitrite: NEGATIVE
Protein, ur: NEGATIVE mg/dL
Specific Gravity, Urine: 1.025 (ref 1.005–1.030)
pH: 6.5 (ref 5.0–8.0)

## 2021-02-22 LAB — WET PREP, GENITAL
Sperm: NONE SEEN
Trich, Wet Prep: NONE SEEN
Yeast Wet Prep HPF POC: NONE SEEN

## 2021-02-22 LAB — URINALYSIS, MICROSCOPIC (REFLEX): Squamous Epithelial / HPF: >50 (ref 0–5)

## 2021-02-22 NOTE — MAU Note (Signed)
Pt reports she has been having sharp pain in her lower pelvis and groin area since Friday.  Worse at night makes it hard to sleep. Denies any vag bleeding or discharge.

## 2021-02-22 NOTE — MAU Provider Note (Signed)
Patient Kristy Nguyen is a 31 y.o. G3P1011  At [redacted]w[redacted]d here with complaints of lower abdominal pain that started on Friday. She reports it started after she lifted a heavy box at work. 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 She denies bleeding, LOF, dysuria. She denies any abdominal trauma; she denies contractions, she denies any other complaints. The pain is worse at night when she tries to sleep  History     CSN: [id number]  Arrival date and time: 02/22/21 1925   Event Date/Time   First Provider Initiated Contact with Patient 02/22/21 2009      Chief Complaint  Patient presents with   Abdominal Pain   Abdominal Pain This is a recurrent problem. The current episode started in the past 7 days. The problem occurs intermittently. The problem has been waxing and waning. The pain is located in the suprapubic region. The pain is at a severity of 6/10 (it comes and goes and it lasts for about 15 minutes when it comes on). The pain is moderate. The abdominal pain radiates to the LLQ and RLQ. Pertinent negatives include no diarrhea, dysuria, fever, nausea or vomiting. Nothing aggravates the pain. The pain is relieved by Nothing.   OB History     Gravida  3   Para  1   Term  1   Preterm  0   AB  1   Living  1      SAB  1   IAB  0   Ectopic  0   Multiple      Live Births  1           Past Medical History:  Diagnosis Date   Allergy    Diabetes mellitus without complication ([place])    gestational   Gestational diabetes    Nexplanon insertion 10/28/2016   NSVD (normal spontaneous vaginal delivery) 09/17/2016   Supervision of high-risk pregnancy 04/05/2016    Clinic [facility] Prenatal Labs  Dating lmp and 12.4 week [place] Blood type: --/--/O POS (02/10 1138)   Genetic Screen 1 Screen:    AFP:     Quad:     NIPS: Antibody: negative  Anatomic [place]  Normal, anterior placenta, female  Rubella:  immune  GTT Early:               Third trimester:  Glucose, Fasting 65  - 91 mg/dL 95    Glucose, 1 hour 65 - 179 mg/dL 192    Glucose, 2 hour 65 - 152 mg/dL 150     RPR:   nega    Past Surgical History:  Procedure Laterality Date   DILATION AND EVACUATION  2010    Family History  Problem Relation Age of Onset   Hypertension Mother    Diabetes Father    Heart Problems Father    Anxiety disorder Sister    Depression Sister    Cancer Paternal Uncle        Brain   Diabetes Maternal Grandmother    Diabetes Maternal Grandfather    Diabetes Paternal Grandmother    Diabetes Paternal Grandfather     Social History   Tobacco Use   Smoking status: Every Day    Types: Cigarettes   Smokeless tobacco: Former  Scientific laboratory technician Use: Never used  Substance Use Topics   Alcohol use: No   Drug use: No    Allergies:  Allergies  Allergen Reactions   Fish  Allergy Itching and Swelling   Latex Rash   Penicillins Itching, Swelling and Rash    Has patient had a PCN reaction causing immediate rash, facial/tongue/throat swelling, SOB or lightheadedness with hypotension: No Has patient had a PCN reaction causing severe rash involving mucus membranes or skin necrosis: No Has patient had a PCN reaction that required hospitalization No Has patient had a PCN reaction occurring within the last 10 years: No If all of the above answers are "NO", then may proceed with Cephalosporin use.    No medications prior to admission.    Review of Systems  Constitutional:  Negative for fever.  Gastrointestinal:  Positive for abdominal pain. Negative for diarrhea, nausea and vomiting.  Genitourinary:  Negative for dysuria.  Physical Exam   Blood pressure 126/61, pulse 91, temperature 99.1 F (37.3 C), resp. rate 16, height 4\' 10"  (1.473 m), weight 93 kg, last menstrual period 10/16/2020, SpO2 98 %, unknown if currently breastfeeding.  Physical Exam Constitutional:      Appearance: She is well-developed.  Abdominal:     General: Abdomen is flat.     Palpations:  Abdomen is soft.  Genitourinary:    Vagina: Normal.     Cervix: Normal.  Neurological:     Mental Status: She is alert.    MAU Course  Procedures  MDM -FHR is 162 by Doppler -wet prep negative -UA is with leukocyte Assessment and Plan   1. Round ligament pain   2. Spotting affecting pregnancy in second trimester   3. Supervision of high risk pregnancy, antepartum   4. 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 Work note to return on Wednesday and lifting restriction note given.  -GC CT pending; urine culture pending -keep upcoming appointment for [place] -keep upcoming appt at [facility] 02/23/2021, 10:07 AM

## 2021-02-23 LAB — GC/CHLAMYDIA PROBE AMP (~~LOC~~) NOT AT ARMC
Chlamydia: NEGATIVE
Comment: NEGATIVE
Comment: NORMAL
Neisseria Gonorrhea: NEGATIVE

## 2021-02-24 LAB — CULTURE, OB URINE: Culture: >=100000 — AB

## 2021-02-25 ENCOUNTER — Telehealth: Payer: Self-pay | Admitting: Family Medicine

## 2021-02-25 DIAGNOSIS — O99891 Other specified diseases and conditions complicating pregnancy: Secondary | ICD-10-CM

## 2021-02-25 DIAGNOSIS — R8271 Bacteriuria: Secondary | ICD-10-CM

## 2021-02-25 DIAGNOSIS — Z3481 Encounter for supervision of other normal pregnancy, first trimester: Secondary | ICD-10-CM | POA: Diagnosis not present

## 2021-02-25 MED ORDER — CEPHALEXIN 500 MG PO CAPS: 500.0000 mg | ORAL_CAPSULE | Freq: Four times a day (QID) | ORAL | 0 refills | Status: AC

## 2021-02-25 NOTE — Telephone Encounter (Signed)
Review of MAU results shows +urine culture on 02/22/21, growing Proteus.  Based on sensitivities will send 5 day course of Keflex QID.  Clinical staff please call patient and let her know about positive urine culture as well as need for treatment.

## 2021-02-25 NOTE — Telephone Encounter (Signed)
Called pt with Spanish Interpreter Eda R., and informed her that she has a UTI.  An antibiotic named Keflex that she will take 4 times a day for 5 days.  Pt informed that her medication has been sent to [facility] pharmacy on [address] and [address].  Pt verbalized understanding with no further questions.   Leonette Nutting  02/25/21

## 2021-03-01 ENCOUNTER — Ambulatory Visit (INDEPENDENT_AMBULATORY_CARE_PROVIDER_SITE_OTHER): Payer: BC Managed Care – PPO | Admitting: Obstetrics and Gynecology

## 2021-03-01 ENCOUNTER — Encounter: Payer: Self-pay | Admitting: Obstetrics and Gynecology

## 2021-03-01 ENCOUNTER — Other Ambulatory Visit: Payer: Self-pay

## 2021-03-01 VITALS — BP 117/74 | HR 91 | Wt 204.0 lb

## 2021-03-01 DIAGNOSIS — Z6841 Body Mass Index (BMI) 40.0 and over, adult: Secondary | ICD-10-CM

## 2021-03-01 DIAGNOSIS — Z603 Acculturation difficulty: Secondary | ICD-10-CM

## 2021-03-01 DIAGNOSIS — O09299 Supervision of pregnancy with other poor reproductive or obstetric history, unspecified trimester: Secondary | ICD-10-CM

## 2021-03-01 DIAGNOSIS — O2342 Unspecified infection of urinary tract in pregnancy, second trimester: Secondary | ICD-10-CM

## 2021-03-01 DIAGNOSIS — Z8619 Personal history of other infectious and parasitic diseases: Secondary | ICD-10-CM

## 2021-03-01 DIAGNOSIS — O0990 Supervision of high risk pregnancy, unspecified, unspecified trimester: Secondary | ICD-10-CM | POA: Diagnosis not present

## 2021-03-01 DIAGNOSIS — Z789 Other specified health status: Secondary | ICD-10-CM

## 2021-03-01 DIAGNOSIS — O99210 Obesity complicating pregnancy, unspecified trimester: Secondary | ICD-10-CM

## 2021-03-01 DIAGNOSIS — O99891 Other specified diseases and conditions complicating pregnancy: Secondary | ICD-10-CM

## 2021-03-01 DIAGNOSIS — Z8659 Personal history of other mental and behavioral disorders: Secondary | ICD-10-CM

## 2021-03-01 DIAGNOSIS — O24419 Gestational diabetes mellitus in pregnancy, unspecified control: Secondary | ICD-10-CM

## 2021-03-01 MED ORDER — ACCU-CHEK GUIDE W/DEVICE KIT: 1.0000 | PACK | Freq: Four times a day (QID) | 0 refills | Status: DC

## 2021-03-01 MED ORDER — ASPIRIN EC 81 MG PO TBEC: 81.0000 mg | DELAYED_RELEASE_TABLET | Freq: Every day | ORAL | 1 refills | Status: DC

## 2021-03-01 MED ORDER — GLUCOSE BLOOD VI STRP: ORAL_STRIP | 12 refills | Status: DC

## 2021-03-01 MED ORDER — ACCU-CHEK SOFTCLIX LANCETS MISC: 12 refills | Status: DC

## 2021-03-01 NOTE — Progress Notes (Signed)
° ° °  PRENATAL VISIT NOTE  Transfer of care from [facility] due to new GDM diagnosis  Subjective:  Kristy Nguyen is a 31 y.o. G3P1011 at [redacted]w[redacted]d being seen today for ongoing prenatal care.  She is currently monitored for the following issues for this high-risk pregnancy and has Urinary tract infection in mother during second trimester of pregnancy; GDM (gestational diabetes mellitus); History of group B Streptococcus (GBS) infection; History of oligohydramnios in prior pregnancy, currently pregnant; Spotting affecting pregnancy; Supervision of high risk pregnancy, antepartum; History of postpartum depression, currently pregnant; BMI 40.0-44.9, adult (HCC); Language barrier; and Obesity in pregnancy on their problem list.  Patient reports no complaints.  Contractions: Not present. Vag. Bleeding: None.  Movement: Present. Denies leaking of fluid.   The following portions of the patient's history were reviewed and updated as appropriate: allergies, current medications, past family history, past medical history, past social history, past surgical history and problem list.   Objective:   Vitals:   03/01/21 1006  BP: 117/74  Pulse: 91  Weight: 204 lb (92.5 kg)    Fetal Status: Fetal Heart Rate (bpm): 145   Movement: Present     General:  Alert, oriented and cooperative. Patient is in no acute distress.  Skin: Skin is warm and dry. No rash noted.   Cardiovascular: Normal heart rate noted  Respiratory: Normal respiratory effort, no problems with respiration noted  Abdomen: Soft, gravid, appropriate for gestational age.  Pain/Pressure: Present     Pelvic: Cervical exam deferred        Extremities: Normal range of motion.     Mental Status: Normal mood and affect. Normal behavior. Normal judgment and thought content.   Assessment and Plan:  Pregnancy: G3P1011 at [redacted]w[redacted]d 1. Supervision of high risk pregnancy, antepartum Routine care. Pt amenable to low dose asa - [place] MFM OB DETAIL +14 WK;  Future - [facility] AMB BABYSCRIPTS SCHEDULE OPTIMIZATION - Hemoglobin A1c - AFP, Serum, Open Spina Bifida  2. BMI 40.0-44.9, adult (HCC)  3. History of postpartum depression, currently pregnant No current issues  4. History of oligohydramnios in prior pregnancy, currently pregnant At term.  5. GDM H/o GDMa2 (metformin) with last pregnancy in 2018. Pt comfortable checking sugars again. Will send in supplies. Pt would like to see DM education again since it's been so long. Referral placed. Pt asked to start checking sugars again starting today. Log sheet given  6. Obesity in pregnancy  7. Language barrier Interpreter used  8. Urinary tract infection in mother during second trimester of pregnancy Toc mid february  Preterm labor symptoms and general obstetric precautions including but not limited to vaginal bleeding, contractions, leaking of fluid and fetal movement were reviewed in detail with the patient. Please refer to After Visit Summary for other counseling recommendations.   Return in 9 days (on 03/10/2021) for in person, md visit, high risk ob.  Future Appointments  Date Time Provider Department Center  03/10/2021  1:15 PM [facility] NURSE [facility]  03/10/2021  1:30 PM [facility] US3 [facility]    Ganado Bing, MD

## 2021-03-01 NOTE — Addendum Note (Signed)
Addended by: Cline Crock on: 03/01/2021 10:46 AM   Modules accepted: Orders

## 2021-03-01 NOTE — Progress Notes (Signed)
Patient ultrasound scheduled for 03/10/21 at 1:15 PM. Patient notified.

## 2021-03-03 LAB — AFP, SERUM, OPEN SPINA BIFIDA
AFP MoM: 2.37
AFP Value: 64.2 ng/mL
Gest. Age on Collection Date: 15.5 weeks
Maternal Age At EDD: 31.2 yr
OSBR Risk 1 IN: 370
Test Results:: NEGATIVE
Weight: 204 [lb_av]

## 2021-03-03 LAB — HEMOGLOBIN A1C
Est. average glucose Bld gHb Est-mCnc: 100 mg/dL
Hgb A1c MFr Bld: 5.1 % (ref 4.8–5.6)

## 2021-03-08 ENCOUNTER — Encounter: Payer: Self-pay | Admitting: Registered"

## 2021-03-08 ENCOUNTER — Encounter: Payer: BC Managed Care – PPO | Attending: Obstetrics and Gynecology | Admitting: Registered"

## 2021-03-08 ENCOUNTER — Other Ambulatory Visit: Payer: Self-pay

## 2021-03-08 DIAGNOSIS — O24419 Gestational diabetes mellitus in pregnancy, unspecified control: Secondary | ICD-10-CM | POA: Insufficient documentation

## 2021-03-08 NOTE — Progress Notes (Deleted)
° °  PRENATAL VISIT NOTE  Subjective:  Kristy Nguyen is a 31 y.o. G3P1011 at [redacted]w[redacted]d being seen today for ongoing prenatal care.  She is currently monitored for the following issues for this high-risk pregnancy and has Urinary tract infection in mother during second trimester of pregnancy; GDM (gestational diabetes mellitus); History of group B Streptococcus (GBS) infection; History of oligohydramnios in prior pregnancy, currently pregnant; Supervision of high risk pregnancy, antepartum; History of postpartum depression, currently pregnant; BMI 40.0-44.9, adult ([place]); Language barrier; and Obesity in pregnancy on their problem list.  Patient reports {[id number]}.   .  .   . Denies leaking of fluid.   The following portions of the patient's history were reviewed and updated as appropriate: allergies, current medications, past family history, past medical history, past social history, past surgical history and problem list.   Objective:  There were no vitals filed for this visit.  Fetal Status:           General:  Alert, oriented and cooperative. Patient is in no acute distress.  Skin: Skin is warm and dry. No rash noted.   Cardiovascular: Normal heart rate noted  Respiratory: Normal respiratory effort, no problems with respiration noted  Abdomen: Soft, gravid, appropriate for gestational age.        Pelvic: Cervical exam deferred        Extremities: Normal range of motion.     Mental Status: Normal mood and affect. Normal behavior. Normal judgment and thought content.   Assessment and Plan:  Pregnancy: G3P1011 at [redacted]w[redacted]d 1. Gestational diabetes mellitus (GDM) in second trimester, gestational diabetes method of control unspecified - Presumed based on h/o GDMA2 in prior pregnancy controlled with Metformin - CBGs reviewed: *** - Has education scheduled for 1/31  2. Urinary tract infection in mother during second trimester of pregnancy Recheck mid-feb  3. Supervision of high risk  pregnancy, antepartum Detail anatomy scheduled for 1/25 ***  4. BMI 40.0-44.9, adult ([place])  5. Language barrier Interpreter used  Preterm labor symptoms and general obstetric precautions including but not limited to vaginal bleeding, contractions, leaking of fluid and fetal movement were reviewed in detail with the patient. Please refer to After Visit Summary for other counseling recommendations.   No follow-ups on file.  Future Appointments  Date Time Provider [place]  03/10/2021  1:15 PM [facility] NURSE [facility]  03/10/2021  1:30 PM [facility] US3 [facility]  03/10/2021  2:35 PM Radene Gunning, MD [facility]  03/16/2021  3:15 PM [facility]    Radene Gunning, MD

## 2021-03-08 NOTE — Progress Notes (Signed)
Patient was seen for Gestational Diabetes self-management on 03/08/21  Start time 1410 and End time 1515   In-person interpreter Stark Klein from [employer]. Patient speaks some [place].  Estimated due date: 07/23/21; [redacted]w[redacted]d  Clinical: Medications: Aspirin, prenatal vitamin Medical History: GDM,.  Labs: OGTT 92-223(H)-165(H)-137, A1c 5.1%   Dietary and Lifestyle History: Patient works at [employer].   Physical Activity: not assessed Stress: not assessed Sleep: not assessed  24 hr Recall:  First Meal: cornflakes, milk Snack: cookie Second meal: soup OR pizza Snack: mango, apple, banana, orange Third meal: no dinner to avoid dinner Snack: Beverages: water, ~16 oz sweet tea every other day, apple juice occasionally  NUTRITION INTERVENTION  Nutrition education (E-1) on the following topics:   Initial Follow-up  [x]  []  Definition of Gestational Diabetes [x]  []  Why dietary management is important in controlling blood glucose [x]  []  Effects each nutrient has on blood glucose levels []  []  Simple carbohydrates vs complex carbohydrates []  []  Fluid intake [x]  []  Creating a balanced meal plan [x]  []  Carbohydrate counting  [x]  []  When to check blood glucose levels [x]  []  Proper blood glucose monitoring techniques [x]  []  Effect of stress and stress reduction techniques  [x]  []  Exercise effect on blood glucose levels, appropriate exercise during pregnancy []  []  Importance of limiting caffeine and abstaining from alcohol and smoking [x]  []  Medications used for blood sugar control during pregnancy [x]  []  Hypoglycemia and rule of 15 [x]  []  Postpartum self care  Blood glucose monitor given: Accu-chek Guide Me Lot Exp: 05/03/2022 CBG: 135 mg/dL  Patient instructed to monitor glucose levels: FBS: 60 - ? 95 mg/dL (some clinics use 90 for cutoff) 1 hour: ? 140 mg/dL 2 hour: ? mg/dL  Patient received handouts: Nutrition Diabetes and  Pregnancy Carbohydrate Counting List  Patient will be seen for follow-up as needed.

## 2021-03-10 ENCOUNTER — Other Ambulatory Visit: Payer: Self-pay

## 2021-03-10 ENCOUNTER — Ambulatory Visit: Payer: BC Managed Care – PPO | Attending: Obstetrics and Gynecology

## 2021-03-10 ENCOUNTER — Other Ambulatory Visit: Payer: BC Managed Care – PPO

## 2021-03-10 ENCOUNTER — Encounter: Payer: BC Managed Care – PPO | Admitting: Obstetrics and Gynecology

## 2021-03-10 ENCOUNTER — Inpatient Hospital Stay (HOSPITAL_COMMUNITY)
Admission: AD | Admit: 2021-03-10 | Discharge: 2021-03-10 | Disposition: A | Discharge status: Home / Self Care | Payer: BC Managed Care – PPO | Attending: Obstetrics & Gynecology | Admitting: Obstetrics & Gynecology

## 2021-03-10 DIAGNOSIS — M545 Low back pain, unspecified: Secondary | ICD-10-CM | POA: Diagnosis not present

## 2021-03-10 DIAGNOSIS — O2342 Unspecified infection of urinary tract in pregnancy, second trimester: Secondary | ICD-10-CM

## 2021-03-10 DIAGNOSIS — G8929 Other chronic pain: Secondary | ICD-10-CM | POA: Diagnosis not present

## 2021-03-10 DIAGNOSIS — R109 Unspecified abdominal pain: Secondary | ICD-10-CM | POA: Diagnosis not present

## 2021-03-10 DIAGNOSIS — O26892 Other specified pregnancy related conditions, second trimester: Secondary | ICD-10-CM | POA: Insufficient documentation

## 2021-03-10 DIAGNOSIS — O0990 Supervision of high risk pregnancy, unspecified, unspecified trimester: Secondary | ICD-10-CM

## 2021-03-10 DIAGNOSIS — W101XXA Fall (on)(from) sidewalk curb, initial encounter: Secondary | ICD-10-CM | POA: Diagnosis not present

## 2021-03-10 DIAGNOSIS — W010XXA Fall on same level from slipping, tripping and stumbling without subsequent striking against object, initial encounter: Secondary | ICD-10-CM | POA: Diagnosis not present

## 2021-03-10 DIAGNOSIS — O24419 Gestational diabetes mellitus in pregnancy, unspecified control: Secondary | ICD-10-CM

## 2021-03-10 DIAGNOSIS — Z3A20 20 weeks gestation of pregnancy: Secondary | ICD-10-CM | POA: Insufficient documentation

## 2021-03-10 DIAGNOSIS — M25551 Pain in right hip: Secondary | ICD-10-CM | POA: Diagnosis not present

## 2021-03-10 DIAGNOSIS — Z789 Other specified health status: Secondary | ICD-10-CM

## 2021-03-10 DIAGNOSIS — W19XXXA Unspecified fall, initial encounter: Secondary | ICD-10-CM

## 2021-03-10 DIAGNOSIS — Z6841 Body Mass Index (BMI) 40.0 and over, adult: Secondary | ICD-10-CM

## 2021-03-10 LAB — URINALYSIS, ROUTINE W REFLEX MICROSCOPIC
Bilirubin Urine: NEGATIVE
Glucose, UA: NEGATIVE mg/dL
Hgb urine dipstick: NEGATIVE
Ketones, ur: 80 mg/dL — AB
Nitrite: NEGATIVE
Protein, ur: NEGATIVE mg/dL
Specific Gravity, Urine: 1.017 (ref 1.005–1.030)
pH: 6 (ref 5.0–8.0)

## 2021-03-10 MED ORDER — CYCLOBENZAPRINE HCL 5 MG PO TABS: 5.0000 mg | ORAL_TABLET | Freq: Three times a day (TID) | ORAL | 0 refills | Status: DC | PRN

## 2021-03-10 NOTE — MAU Provider Note (Signed)
History     CSN: [id number]  Arrival date and time: 03/10/21 1700   None     Chief Complaint  Patient presents with   Abdominal Pain   Fall   HPI 31 yo F G3P1 at [redacted]w[redacted]d with history of chronic low back pain presents to MAU after slip and fall while going to work today  [employer] this morning Was walking into work this morning and slipped due to wet sidewalk Currently having some low back pain that started before fall Also slight soreness of side No bleeding No LOF Dopplers normal Pain is 7/10, previously was 8-9/10 Denies hitting head Did not fall on abdomen  OB History     Gravida  3   Para  1   Term  1   Preterm  0   AB  1   Living  1      SAB  1   IAB  0   Ectopic  0   Multiple      Live Births  1           Past Medical History:  Diagnosis Date   Allergy    Diabetes mellitus without complication ([place])    gestational   Gestational diabetes    Nexplanon insertion 10/28/2016   NSVD (normal spontaneous vaginal delivery) 09/17/2016   Supervision of high-risk pregnancy 04/05/2016    Clinic [facility] Prenatal Labs  Dating lmp and 12.4 week [place] Blood type: --/--/O POS (02/10 1138)   Genetic Screen 1 Screen:    AFP:     Quad:     NIPS: Antibody: negative  Anatomic [place]  Normal, anterior placenta, female  Rubella:  immune  GTT Early:               Third trimester:  Glucose, Fasting 65 - 91 mg/dL 95    Glucose, 1 hour 65 - 179 mg/dL 192    Glucose, 2 hour 65 - 152 mg/dL 150     RPR:   nega    Past Surgical History:  Procedure Laterality Date   DILATION AND EVACUATION  2010    Family History  Problem Relation Age of Onset   Hypertension Mother    Diabetes Father    Heart Problems Father    Anxiety disorder Sister    Depression Sister    Cancer Paternal Uncle        Brain   Diabetes Maternal Grandmother    Diabetes Maternal Grandfather    Diabetes Paternal Grandmother    Diabetes Paternal Grandfather     Social History   Tobacco Use   Smoking status:  Every Day    Types: Cigarettes   Smokeless tobacco: Former  Scientific laboratory technician Use: Never used  Substance Use Topics   Alcohol use: No   Drug use: No    Allergies:  Allergies  Allergen Reactions   Fish Allergy Itching and Swelling   Latex Rash   Penicillins Itching, Swelling and Rash    Has patient had a PCN reaction causing immediate rash, facial/tongue/throat swelling, SOB or lightheadedness with hypotension: No Has patient had a PCN reaction causing severe rash involving mucus membranes or skin necrosis: No Has patient had a PCN reaction that required hospitalization No Has patient had a PCN reaction occurring within the last 10 years: No If all of the above answers are "NO", then may proceed with Cephalosporin use.    Medications Prior to Admission  Medication Sig Dispense Refill Last Dose  Accu-Chek Softclix Lancets lancets Use as instructed 100 each 12    aspirin EC 81 MG tablet Take 1 tablet (81 mg total) by mouth daily. 60 tablet 1    Blood Glucose Monitoring Suppl (ACCU-CHEK GUIDE) w/Device KIT 1 Device by Does not apply route in the morning, at noon, in the evening, and at bedtime. 1 kit 0    famotidine (PEPCID) 20 MG tablet Take 1 tablet (20 mg total) by mouth 2 (two) times daily. 30 tablet 0    glucose blood test strip Use as instructed 100 each 12    metoCLOPramide (REGLAN) 10 MG tablet Take 1 tablet (10 mg total) by mouth every 6 (six) hours. 30 tablet 0    Multiple Vitamin (MULTIVITAMIN PO) Take by mouth. (Patient not taking: Reported on 03/01/2021)      Prenatal Vit-Fe Fumarate-FA (PRENATAL PO) Take by mouth.       Review of Systems  Constitutional:  Negative for chills and fever.  Respiratory:  Negative for chest tightness and shortness of breath.   Cardiovascular:  Negative for chest pain.  Gastrointestinal:  Negative for abdominal distention and abdominal pain.  Genitourinary:  Negative for dysuria.  Musculoskeletal:  Positive for back pain. Negative for  myalgias and neck pain.  Neurological:  Negative for dizziness.  Hematological:  Negative for adenopathy.  Physical Exam   Blood pressure 109/62, pulse 100, temperature 99.3 F (37.4 C), temperature source Oral, resp. rate 16, last menstrual period 10/16/2020, SpO2 99 %, unknown if currently breastfeeding.  Physical Exam Vitals and nursing note reviewed.  HENT:     Head: Normocephalic.  Eyes:     Pupils: Pupils are equal, round, and reactive to light.  Cardiovascular:     Rate and Rhythm: Normal rate.  Pulmonary:     Effort: Pulmonary effort is normal.  Abdominal:     Comments: Gravid, no TTP of abdomen, no visible bruising or lesions. Mild TTP of sacral area of hip  Neurological:     Mental Status: She is alert.    MAU Course  Procedures None Doppler normal with FHT: 10  MDM 31 yo F G3P1 at 56w5dwith history of chronic low back pain presents to MAU after slip and fall while going to work today, currently acute pain improved and chronic low back pain/hip   Assessment and Plan  31yo F G3P1 at 29w5dith history of chronic low back pain presents to MAU after slip and fall while going to work today. Appears well. Denies bleeding and denies LOF. Hemodynamically stable. Normal fetal heart tones. Pain at this time is her chronic pain and some soreness of right side. No bruising or other visible lesions. Gait normal. Discussed treatment with tylenol and PRN Flexeril. Patient agreeable and ok with plan. - safe to discharge home   AnRenard Matter/25/2023, 7:16 PM

## 2021-03-10 NOTE — MAU Note (Signed)
Kristy Nguyen is a 31 y.o. at [redacted]w[redacted]d here in MAU reporting: this morning when she was leaving for work she fell onto her right side. Is now having abdominal pain. No bleeding. No LOF.  Onset of complaint: today  Pain score: 7/10  Vitals:   03/10/21 1724  BP: 109/62  Pulse: 100  Resp: 16  Temp: 99.3 F (37.4 C)  SpO2: 99%     FHT:164  Lab orders placed from triage: UA

## 2021-03-10 NOTE — Discharge Instructions (Addendum)
You came to the hospital because you fell on your side and also low back pain. Your low back pain has been going on for the few weeks. We checked your baby's heart rate and it was normal. You did not have any bleeding and your pain was getting better. So we felt it was safe to go home. We recommend you take Tylenol for the pain, use a belly band, and also can try the Flexeril (muscle relaxer) that I sent to the pharmacy as needed.  Please seek medical care if you have worsening abdominal pain or have vaginal bleeding.

## 2021-03-11 ENCOUNTER — Inpatient Hospital Stay (HOSPITAL_COMMUNITY)
Admission: AD | Admit: 2021-03-11 | Discharge: 2021-03-11 | Disposition: A | Discharge status: Home / Self Care | Payer: BC Managed Care – PPO | Attending: Obstetrics and Gynecology | Admitting: Obstetrics and Gynecology

## 2021-03-11 ENCOUNTER — Encounter (HOSPITAL_COMMUNITY): Payer: Self-pay | Admitting: Obstetrics and Gynecology

## 2021-03-11 DIAGNOSIS — O0990 Supervision of high risk pregnancy, unspecified, unspecified trimester: Secondary | ICD-10-CM

## 2021-03-11 DIAGNOSIS — R1030 Lower abdominal pain, unspecified: Secondary | ICD-10-CM | POA: Diagnosis not present

## 2021-03-11 DIAGNOSIS — Z3A20 20 weeks gestation of pregnancy: Secondary | ICD-10-CM | POA: Insufficient documentation

## 2021-03-11 DIAGNOSIS — O26899 Other specified pregnancy related conditions, unspecified trimester: Secondary | ICD-10-CM | POA: Diagnosis not present

## 2021-03-11 DIAGNOSIS — Z0371 Encounter for suspected problem with amniotic cavity and membrane ruled out: Secondary | ICD-10-CM

## 2021-03-11 DIAGNOSIS — N760 Acute vaginitis: Secondary | ICD-10-CM

## 2021-03-11 DIAGNOSIS — R109 Unspecified abdominal pain: Secondary | ICD-10-CM | POA: Insufficient documentation

## 2021-03-11 DIAGNOSIS — B9689 Other specified bacterial agents as the cause of diseases classified elsewhere: Secondary | ICD-10-CM

## 2021-03-11 DIAGNOSIS — O26892 Other specified pregnancy related conditions, second trimester: Secondary | ICD-10-CM | POA: Insufficient documentation

## 2021-03-11 LAB — URINALYSIS, ROUTINE W REFLEX MICROSCOPIC
Bilirubin Urine: NEGATIVE
Glucose, UA: NEGATIVE mg/dL
Hgb urine dipstick: NEGATIVE
Ketones, ur: 5 mg/dL — AB
Nitrite: NEGATIVE
Protein, ur: 100 mg/dL — AB
Specific Gravity, Urine: 1.028 (ref 1.005–1.030)
Squamous Epithelial / HPF: >50 — ABNORMAL HIGH (ref 0–5)
WBC, UA: >50 WBC/hpf — ABNORMAL HIGH (ref 0–5)
pH: 7 (ref 5.0–8.0)

## 2021-03-11 LAB — WET PREP, GENITAL
Sperm: NONE SEEN
Trich, Wet Prep: NONE SEEN
WBC, Wet Prep HPF POC: >=10 — AB (ref <10)
Yeast Wet Prep HPF POC: NONE SEEN

## 2021-03-11 LAB — POCT FERN TEST: POCT Fern Test: NEGATIVE

## 2021-03-11 MED ORDER — ACETAMINOPHEN 500 MG PO TABS
1000.0000 mg | ORAL_TABLET | Freq: Once | ORAL | Status: AC
  Administered 2021-03-11: 1000 mg via ORAL
  Filled 2021-03-11: qty 2

## 2021-03-11 MED ORDER — METRONIDAZOLE 0.75 % VA GEL: 1.0000 | Freq: Every day | VAGINAL | 0 refills | Status: DC

## 2021-03-11 NOTE — MAU Note (Signed)
Pt seen in MAU yesterday for a fall. Every check out  and was sent home. This morning she is feeling dizzy and she vomited 2 times and had leaking from her vagina  clear fluid 2 times as well. Also c/o mid to lower abd pain.

## 2021-03-11 NOTE — MAU Provider Note (Signed)
History     CSN: [id number]  Arrival date and time: 03/11/21 1417   Event Date/Time   First Provider Initiated Contact with Patient 03/11/21 1548      Chief Complaint  Patient presents with   Vaginal Discharge   Abdominal Pain   Kristy Nguyen is a 31 y.o. G3P1011 at 55w6dwho [facility]  She presents  today for Vaginal Leaking and Abdominal Pain.  She reports having an incident of fluid loss while attempting to use the restroom.  She reports it was a large amount and she could not stop the flow and urinated after.  She states she had another incident later today under similar circumstances.  She reports abdominal cramping in the lower abdomen that is intermittent.  She has not attempted any interventions so has not identified any relieving factors, but finds that laying down worsens the cramping.     OB History     Gravida  3   Para  1   Term  1   Preterm  0   AB  1   Living  1      SAB  1   IAB  0   Ectopic  0   Multiple      Live Births  1           Past Medical History:  Diagnosis Date   Allergy    Diabetes mellitus without complication ([place]    gestational   Gestational diabetes    Nexplanon insertion 10/28/2016   NSVD (normal spontaneous vaginal delivery) 09/17/2016   Supervision of high-risk pregnancy 04/05/2016    [facility]  Dating lmp and 12.4 week [place] type: --/--/O POS (02/10 1138)   Genetic Screen 1 Screen:    AFP:     Quad:     NIPS: Antibody: negative  Anatomic [place] Normal, anterior placenta, female  Rubella:  immune  GTT Early:               Third trimester:  Glucose, Fasting 65 - 91 mg/dL 95    Glucose, 1 hour 65 - 179 mg/dL 192    Glucose, 2 hour 65 - 152 mg/dL 150     RPR:   nega    Past Surgical History:  Procedure Laterality Date   DILATION AND EVACUATION  2010    Family History  Problem Relation Age of Onset   Hypertension Mother    Diabetes Father    Heart Problems Father    Anxiety disorder  Sister    Depression Sister    Cancer Paternal Uncle        Brain   Diabetes Maternal Grandmother    Diabetes Maternal Grandfather    Diabetes Paternal Grandmother    Diabetes Paternal Grandfather     Social History   Tobacco Use   Smoking status: Every Day    Packs/day: 0.25    Types: Cigarettes   Smokeless tobacco: Former  VScientific laboratory technicianUse: Never used  Substance Use Topics   Alcohol use: No   Drug use: No    Allergies:  Allergies  Allergen Reactions   Fish Allergy Itching and Swelling   Latex Rash   Penicillins Itching, Swelling and Rash    Has patient had a PCN reaction causing immediate rash, facial/tongue/throat swelling, SOB or lightheadedness with hypotension: No Has patient had a PCN reaction causing severe rash involving mucus membranes or skin necrosis: No Has  patient had a PCN reaction that required hospitalization No Has patient had a PCN reaction occurring within the last 10 years: No If all of the above answers are "NO", then may proceed with Cephalosporin use.    Medications Prior to Admission  Medication Sig Dispense Refill Last Dose   Accu-Chek Softclix Lancets lancets Use as instructed 100 each 12 Past Week   Blood Glucose Monitoring Suppl (ACCU-CHEK GUIDE) w/Device KIT 1 Device by Does not apply route in the morning, at noon, in the evening, and at bedtime. 1 kit 0 Past Week   doxylamine, Sleep, (UNISOM) 25 MG tablet Take 25 mg by mouth at bedtime as needed.   03/10/2021   glucose blood test strip Use as instructed 100 each 12 Past Week   Prenatal Vit-Fe Fumarate-FA (PRENATAL PO) Take by mouth.   03/10/2021   pyridoxine (B-6) 100 MG tablet Take 100 mg by mouth daily.      aspirin EC 81 MG tablet Take 1 tablet (81 mg total) by mouth daily. 60 tablet 1    cyclobenzaprine (FLEXERIL) 5 MG tablet Take 1 tablet (5 mg total) by mouth 3 (three) times daily as needed for muscle spasms. 15 tablet 0    famotidine (PEPCID) 20 MG tablet Take 1 tablet (20 mg  total) by mouth 2 (two) times daily. 30 tablet 0    metoCLOPramide (REGLAN) 10 MG tablet Take 1 tablet (10 mg total) by mouth every 6 (six) hours. 30 tablet 0    Multiple Vitamin (MULTIVITAMIN PO) Take by mouth. (Patient not taking: Reported on 03/01/2021)       Review of Systems  Gastrointestinal:  Positive for abdominal pain (Cramping), nausea and vomiting.  Genitourinary:  Negative for difficulty urinating, dysuria, vaginal bleeding and vaginal discharge.  Neurological:  Positive for headaches (Yesterday, None currently). Negative for dizziness and light-headedness.  Physical Exam   Blood pressure 110/69, pulse 96, temperature 99.2 F (37.3 C), resp. rate 18, last menstrual period 10/16/2020, SpO2 98 %, unknown if currently breastfeeding.  Physical Exam Vitals reviewed. Exam conducted with a chaperone present.  Constitutional:      Appearance: She is well-developed.  HENT:     Head: Normocephalic and atraumatic.  Eyes:     Conjunctiva/sclera: Conjunctivae normal.  Cardiovascular:     Rate and Rhythm: Normal rate.  Pulmonary:     Effort: Pulmonary effort is normal. No respiratory distress.  Abdominal:     Tenderness: There is no abdominal tenderness.  Genitourinary:    Vagina: Vaginal discharge present.     Comments: Sterile Speculum Exam: -Normal External Genitalia: Non tender, no apparent discharge at introitus.  -Vaginal Vault: Pink mucosa with good rugae. Small amt frothy white discharge -wet prep and fern collected -Cervix:Pink, no lesions, cysts, or polyps.  Appears closed. No active bleeding from os -Bimanual Exam:  Closed Musculoskeletal:     Cervical back: Normal range of motion.  Skin:    General: Skin is warm and dry.  Neurological:     Mental Status: She is alert and oriented to person, place, and time.  Psychiatric:        Mood and Affect: Mood normal.        Behavior: Behavior normal.    MAU Course  Procedures Results for orders placed or performed  during the hospital encounter of 03/11/21 (from the past 24 hour(s))  Wet prep, genital     Status: Abnormal   Collection Time: 03/11/21  3:58 PM   Specimen: Vaginal  Result  Value Ref Range   Yeast Wet Prep HPF POC NONE SEEN NONE SEEN   Trich, Wet Prep NONE SEEN NONE SEEN   Clue Cells Wet Prep HPF POC PRESENT (A) NONE SEEN   WBC, Wet Prep HPF POC >=10 (A) <10   Sperm NONE SEEN   Urinalysis, Routine w reflex microscopic Urine, Clean Catch     Status: Abnormal   Collection Time: 03/11/21  4:07 PM  Result Value Ref Range   Color, Urine AMBER (A) YELLOW   APPearance CLOUDY (A) CLEAR   Specific Gravity, Urine 1.028 1.005 - 1.030   pH 7.0 5.0 - 8.0   Glucose, UA NEGATIVE NEGATIVE mg/dL   Hgb urine dipstick NEGATIVE NEGATIVE   Bilirubin Urine NEGATIVE NEGATIVE   Ketones, ur 5 (A) NEGATIVE mg/dL   Protein, ur 100 (A) NEGATIVE mg/dL   Nitrite NEGATIVE NEGATIVE   Leukocytes,Ua SMALL (A) NEGATIVE   RBC / HPF 6-10 0 - 5 RBC/hpf   WBC, UA >50 (H) 0 - 5 WBC/hpf   Bacteria, UA FEW (A) NONE SEEN   Squamous Epithelial / LPF >50 (H) 0 - 5   Mucus PRESENT    Non Squamous Epithelial 0-5 (A) NONE SEEN  Fern Test     Status: None   Collection Time: 03/11/21  4:34 PM  Result Value Ref Range   POCT Fern Test Negative = intact amniotic membranes     MDM Labs: UA, UC Cultures: Wet Prep, Fern Pain management Prescription Assessment and Plan  31 year old, G3P1011  SIUP at 20.6 weeks Abdominal Cramping  -Reviewed POC with patient. -Exam performed and findings discussed.  -Fern collected and negative.  -Discussed pain medication and patient agreeable.   -Will give tylenol and await results.   Maryann Conners 03/11/2021, 3:49 PM   Reassessment (5:09 PM)  -Results as above. -Patient reports improvement of abdominal cramping with tylenol and now rates 5/10. -Discussed findings and recommendation for treatment for BV. -Patient agreeable.  -Will send script for metrogel. -Precautions  given. -Encouraged to call primary office or return to MAU if symptoms worsen or with the onset of new symptoms. -Discharged to home in stable condition.  Maryann Conners MSN, CNM Advanced Practice Provider, Center for [employer] [facility]

## 2021-03-13 LAB — CULTURE, OB URINE: Culture: >=100000 — AB

## 2021-03-14 ENCOUNTER — Other Ambulatory Visit: Payer: Self-pay | Admitting: Student

## 2021-03-14 DIAGNOSIS — N390 Urinary tract infection, site not specified: Secondary | ICD-10-CM | POA: Insufficient documentation

## 2021-03-14 DIAGNOSIS — N3000 Acute cystitis without hematuria: Secondary | ICD-10-CM

## 2021-03-14 MED ORDER — SULFAMETHOXAZOLE-TRIMETHOPRIM 800-160 MG PO TABS: 1.0000 | ORAL_TABLET | Freq: Two times a day (BID) | ORAL | 0 refills | Status: DC

## 2021-03-15 ENCOUNTER — Telehealth: Payer: Self-pay | Admitting: Student

## 2021-03-15 NOTE — Telephone Encounter (Signed)
Created in error

## 2021-03-15 NOTE — Telephone Encounter (Signed)
Called patient to inform her of UTI and need for antibiotics; patient will pick up and start today. Patient had other questions about her appts; I clarified with her the upcoming times and dates for her Ultrasound. Patient thanked me for the call.   Kristy Nguyen

## 2021-03-16 ENCOUNTER — Encounter (HOSPITAL_COMMUNITY): Payer: Self-pay | Admitting: Obstetrics & Gynecology

## 2021-03-16 ENCOUNTER — Inpatient Hospital Stay (HOSPITAL_BASED_OUTPATIENT_CLINIC_OR_DEPARTMENT_OTHER): Payer: BC Managed Care – PPO

## 2021-03-16 ENCOUNTER — Other Ambulatory Visit: Payer: BC Managed Care – PPO | Admitting: Registered"

## 2021-03-16 ENCOUNTER — Inpatient Hospital Stay (HOSPITAL_COMMUNITY)
Admission: AD | Admit: 2021-03-16 | Discharge: 2021-03-16 | Disposition: A | Discharge status: Home / Self Care | Payer: BC Managed Care – PPO | Attending: Obstetrics & Gynecology | Admitting: Obstetrics & Gynecology

## 2021-03-16 DIAGNOSIS — R109 Unspecified abdominal pain: Secondary | ICD-10-CM

## 2021-03-16 DIAGNOSIS — M545 Low back pain, unspecified: Secondary | ICD-10-CM | POA: Insufficient documentation

## 2021-03-16 DIAGNOSIS — O23592 Infection of other part of genital tract in pregnancy, second trimester: Secondary | ICD-10-CM | POA: Insufficient documentation

## 2021-03-16 DIAGNOSIS — O26892 Other specified pregnancy related conditions, second trimester: Secondary | ICD-10-CM

## 2021-03-16 DIAGNOSIS — N3000 Acute cystitis without hematuria: Secondary | ICD-10-CM

## 2021-03-16 DIAGNOSIS — R102 Pelvic and perineal pain: Secondary | ICD-10-CM | POA: Diagnosis not present

## 2021-03-16 DIAGNOSIS — Z3A21 21 weeks gestation of pregnancy: Secondary | ICD-10-CM | POA: Diagnosis not present

## 2021-03-16 DIAGNOSIS — B9689 Other specified bacterial agents as the cause of diseases classified elsewhere: Secondary | ICD-10-CM | POA: Diagnosis not present

## 2021-03-16 DIAGNOSIS — N390 Urinary tract infection, site not specified: Secondary | ICD-10-CM | POA: Diagnosis not present

## 2021-03-16 DIAGNOSIS — F17210 Nicotine dependence, cigarettes, uncomplicated: Secondary | ICD-10-CM | POA: Insufficient documentation

## 2021-03-16 DIAGNOSIS — O2342 Unspecified infection of urinary tract in pregnancy, second trimester: Secondary | ICD-10-CM | POA: Diagnosis not present

## 2021-03-16 DIAGNOSIS — O99332 Smoking (tobacco) complicating pregnancy, second trimester: Secondary | ICD-10-CM | POA: Insufficient documentation

## 2021-03-16 DIAGNOSIS — N760 Acute vaginitis: Secondary | ICD-10-CM | POA: Diagnosis not present

## 2021-03-16 DIAGNOSIS — G8929 Other chronic pain: Secondary | ICD-10-CM

## 2021-03-16 DIAGNOSIS — Z880 Allergy status to penicillin: Secondary | ICD-10-CM | POA: Insufficient documentation

## 2021-03-16 MED ORDER — SULFAMETHOXAZOLE-TRIMETHOPRIM 800-160 MG PO TABS
1.0000 | ORAL_TABLET | Freq: Once | ORAL | Status: AC
  Administered 2021-03-16: 1 via ORAL
  Filled 2021-03-16: qty 1

## 2021-03-16 NOTE — MAU Note (Signed)
Was here last wk on Wed, she fell. Came back on Thursday because of pain.  Started feeling contractions on Friday. Started feeling pain in her pelvis, hurts to walk. Has been going on since Friday. Pain is worse.

## 2021-03-16 NOTE — Discharge Instructions (Signed)
You came to the MAU because you had lower abdominal pain and back pain. We did an ultrasound and your placenta, fluid levels, and baby's heart rate looked normal. We think your belly pain is likely related to your urinary tract infection and also possibly your vaginal infection. You also have back pain that is chronic and think using a belly band could help  Please seek medical care if you have heavy vaginal bleeding, worsening abdominal pain, or any feeling your water is broken.

## 2021-03-16 NOTE — MAU Provider Note (Signed)
History     CSN: [id number]  Arrival date and time: 03/16/21 1812   Event Date/Time   First Provider Initiated Contact with Patient 03/16/21 1851      Chief Complaint  Patient presents with   Pelvic Pain   HPI 31yo F [id number] at [redacted]w[redacted]d who presents with pelvic pain and low back pain for past 4 days.   Reports Friday started feeling contractions Of note, patient was here for a fall last week and had normal dopplers and also was back two days later for discharge Was diagnosed with UTI and BV Points to groin when asked where pain  Feels like period pain Also back pain Has previously had miscarirage and its started like this No bleeding No discharge  Recent diagnosed with UTI - has not started taking medications yet Also diagnosed with BV and  Reports has UTI sbefore but feels these symptoms are different than UTI Some nausea - butu feels that's part of pregnancy No vomiting Denies dysuria Reports this back pain is different than her usual back pain This pain is stronger   OB History     Gravida  3   Para  1   Term  1   Preterm  0   AB  1   Living  1      SAB  1   IAB  0   Ectopic  0   Multiple      Live Births  1           Past Medical History:  Diagnosis Date   Allergy    Diabetes mellitus without complication (HCC)    gestational   Gestational diabetes    Nexplanon insertion 10/28/2016   NSVD (normal spontaneous vaginal delivery) 09/17/2016   Supervision of high-risk pregnancy 04/05/2016    Clinic [facility] Prenatal Labs  Dating lmp and 12.4 week [place] Blood type: --/--/O POS (02/10 1138)   Genetic Screen 1 Screen:    AFP:     Quad:     NIPS: Antibody: negative  Anatomic [place]  Normal, anterior placenta, female  Rubella:  immune  GTT Early:               Third trimester:  Glucose, Fasting 65 - 91 mg/dL 95    Glucose, 1 hour 65 - 179 mg/dL 465    Glucose, 2 hour 65 - 152 mg/dL 681     RPR:   nega    Past Surgical History:  Procedure Laterality Date    DILATION AND EVACUATION  2010    Family History  Problem Relation Age of Onset   Hypertension Mother    Diabetes Father    Heart Problems Father    Anxiety disorder Sister    Depression Sister    Cancer Paternal Uncle        Brain   Diabetes Maternal Grandmother    Diabetes Maternal Grandfather    Diabetes Paternal Grandmother    Diabetes Paternal Grandfather     Social History   Tobacco Use   Smoking status: Every Day    Packs/day: 0.25    Types: Cigarettes   Smokeless tobacco: Former  Building services engineer Use: Never used  Substance Use Topics   Alcohol use: No   Drug use: No    Allergies:  Allergies  Allergen Reactions   Fish Allergy Itching and Swelling   Latex Rash   Penicillins Itching, Swelling and Rash    Has patient had a PCN  reaction causing immediate rash, facial/tongue/throat swelling, SOB or lightheadedness with hypotension: No Has patient had a PCN reaction causing severe rash involving mucus membranes or skin necrosis: No Has patient had a PCN reaction that required hospitalization No Has patient had a PCN reaction occurring within the last 10 years: No If all of the above answers are "NO", then may proceed with Cephalosporin use.    No medications prior to admission.    Review of Systems  Constitutional:  Negative for chills and fever.  Respiratory:  Negative for shortness of breath.   Cardiovascular:  Negative for chest pain.  Gastrointestinal:  Positive for abdominal pain. Negative for constipation, diarrhea, nausea and vomiting.  Endocrine: Negative for polyuria.  Genitourinary:  Negative for dysuria.  Musculoskeletal:  Positive for back pain and myalgias.  Skin:  Negative for rash.  Neurological:  Negative for headaches.  Psychiatric/Behavioral:  Negative for agitation.   Physical Exam   Blood pressure 113/64, pulse (!) 102, temperature 98.9 F (37.2 C), temperature source Oral, resp. rate 18, height 4\' 11"  (1.499 m), weight 94 kg, last  menstrual period 10/16/2020, SpO2 100 %, unknown if currently breastfeeding.  Physical Exam Vitals and nursing note reviewed.  HENT:     Head: Normocephalic.  Cardiovascular:     Rate and Rhythm: Normal rate.     Pulses: Normal pulses.  Pulmonary:     Effort: Pulmonary effort is normal.  Abdominal:     General: There is no distension.     Tenderness: There is no guarding.     Comments: Mild TTP of left and right lower quadrants  Musculoskeletal:        General: Normal range of motion.     Comments: Mild TTP of bilateral sacral area, no midline tenderness, no CVA tenderness  Neurological:     General: No focal deficit present.     Mental Status: She is alert and oriented to person, place, and time.  Psychiatric:        Mood and Affect: Mood normal.    MAU Course  Procedures Limited MFM US  Preliminary read  FHR 157 Normal fluid Cervical length normal Placenta posterior   MDM Moderate Presents with intermittent lower abdominal/pelvic pain for last 4 days after recent fall. Also with chronic low back pain. Normal dopplers. Limited US with normal HR, cervical length, placenta, and fluid. Patient with recent UTI dx hasn't started antibiotics and also recent BV diagnosis. Back pain is chronic without any signs of pyelonephritis.  Assessment and Plan  Pelvic pain Lower abdominal pain Patient with 4 days of pain. Normal doppler. US done given recent fall and pain hasnt resolved. No bleeding and normal US with appropriate heart tones, cervical length, fluid, and placenta. Therefore low suspicion for pregnancy loss at this time. Likely pain is related to patient's known UTI and BV infection. Discussed in detail continuing BV treatment and starting UTI medication. Patient expressed understanding. - Bactrim dose here  - Discussed safety of PRN pain meds - Discussed return precautions in detail - Spanish interpreter (in person) present for all discussions - provided extensive amounts  of reassurance regarding information we have right now  3. Back pain Patient with known chronic low back pain. No CVA tenderness and no fevers. Normal vital signs. On exam TTP is in hip area. ROM normal. Negative straight leg raise test. Discussed belly band. Discussed physical therapy and patient would like to try - PT referral placed   Warner MccreedyAnuka Quincy Prisco, MD, MPH OB Fellow,  Faculty Practice

## 2021-03-25 ENCOUNTER — Other Ambulatory Visit: Payer: Self-pay

## 2021-03-25 ENCOUNTER — Ambulatory Visit: Payer: BC Managed Care – PPO | Attending: Family Medicine | Admitting: Physical Therapy

## 2021-03-25 ENCOUNTER — Encounter: Payer: Self-pay | Admitting: Physical Therapy

## 2021-03-25 DIAGNOSIS — R293 Abnormal posture: Secondary | ICD-10-CM | POA: Insufficient documentation

## 2021-03-25 DIAGNOSIS — M545 Low back pain, unspecified: Secondary | ICD-10-CM | POA: Insufficient documentation

## 2021-03-25 DIAGNOSIS — M62838 Other muscle spasm: Secondary | ICD-10-CM | POA: Insufficient documentation

## 2021-03-25 DIAGNOSIS — G8929 Other chronic pain: Secondary | ICD-10-CM | POA: Insufficient documentation

## 2021-03-25 DIAGNOSIS — R269 Unspecified abnormalities of gait and mobility: Secondary | ICD-10-CM | POA: Insufficient documentation

## 2021-03-25 NOTE — Therapy (Signed)
[facility] [address] [place], [place], [address] Phone: [phone]   Fax:  [phone]  Physical Therapy Evaluation  Patient Details  Name: Kristy Nguyen MRN: [id number] Date of Birth: 1990/04/24 Referring Provider (PT): Warner Mccreedyas, Anuka, MD 05/23/2021   Encounter Date: 03/25/2021   PT End of Session - 03/25/21 1527     Visit Number 1    Date for PT Re-Evaluation 05/23/21    Authorization Type BCBS    PT Start Time 1530    PT Stop Time 1615    PT Time Calculation (min) 45 min    Activity Tolerance Patient tolerated treatment well    Behavior During Therapy [facility] for tasks assessed/performed             Past Medical History:  Diagnosis Date   Allergy    Diabetes mellitus without complication (HCC)    gestational   Gestational diabetes    Nexplanon insertion 10/28/2016   NSVD (normal spontaneous vaginal delivery) 09/17/2016   Supervision of high-risk pregnancy 04/05/2016    [facility] Prenatal Labs  Dating lmp and 12.4 week [place] Blood type: --/--/O POS (02/10 1138)   Genetic Screen 1 Screen:    AFP:     Quad:     NIPS: Antibody: negative  Anatomic US  Normal, anterior placenta, female  Rubella:  immune  GTT Early:               Third trimester:  Glucose, Fasting 65 - 91 mg/dL 95    Glucose, 1 hour 65 - 179 mg/dL 132192    Glucose, 2 hour 65 - 152 mg/dL 440150     RPR:   nega    Past Surgical History:  Procedure Laterality Date   DILATION AND EVACUATION  2010    There were no vitals filed for this visit.    Subjective Assessment - 03/25/21 1528     Subjective Pt reports she has been unable to work past 2 weeks due to back back on both sides going forward and around top of pelvis to front of pubic bone. Pt did have one fall recently and fell on Rt hip but this is not hurting her. This pain started about one month ago but now it is worse and all the time.    Pertinent History [redacted]wk pregnant, high risk pregnancy, h/o  miscarriage  03/16/21 - dc'd from hospital with contractions, pelvic pain, LBP ~1/27, fall at home normal dopplers, UTI and BC (+) dx that hospitalization. high risk-pregnancy, GDM, h/o group B strep infection, oligohydramnios in prior pregnany, spotting affecting pregnancy, postpartum depression, language barrier, obesity in pregnancy    How long can you sit comfortably? <1 hour    How long can you stand comfortably? 1 hour    How long can you walk comfortably? immediately    Patient Stated Goals to have less pain to be able to return to work    Currently in Pain? Yes    Pain Score 8     Pain Location Pelvis    Pain Orientation Right;Left;Mid    Pain Descriptors / Indicators Stabbing    Pain Type Acute pain    Pain Onset 1 to 4 weeks ago    Pain Frequency Constant    Aggravating Factors  walking, standing, car transfers, stairs, any single leg activity    Pain Relieving Factors resting,  [facility] PT Assessment - 03/25/21 0001       Assessment   Medical Diagnosis M54.50,G89.29 (ICD-10-CM) - Chronic bilateral low back pain without sciatica    Referring Provider (PT) Warner Mccreedy, MD 05/23/2021    Onset Date/Surgical Date 03/25/21    Prior Therapy no      Precautions   Precautions Other (comment)    Precaution Comments high risk pregnancy      Restrictions   Weight Bearing Restrictions No      Balance Screen   Has the patient fallen in the past 6 months Yes    How many times? 1- walking into work and slipped on sidewalk    Has the patient had a decrease in activity level because of a fear of falling?  No    Is the patient reluctant to leave their home because of a fear of falling?  No      Home Tourist information centre manager residence    Living Arrangements Spouse/significant other;Children      Prior Function   Level of Independence Independent      Cognition   Overall Cognitive Status Within Functional Limits for tasks assessed      Sensation    Light Touch Appears Intact      Coordination   Gross Motor Movements are Fluid and Coordinated Yes    Fine Motor Movements are Fluid and Coordinated Yes      Posture/Postural Control   Posture/Postural Control Postural limitations    Postural Limitations Rounded Shoulders;Decreased lumbar lordosis;Increased thoracic kyphosis;Posterior pelvic tilt      ROM / Strength   AROM / PROM / Strength AROM;Strength      AROM   Overall AROM Comments bil thoracic and lumbar spine limited in side bending and rotation by 25% all others [facility]      Strength   Overall Strength Comments bil hips grossly 4/5      Flexibility   Soft Tissue Assessment /Muscle Length yes   bil hamstrings and adductors limited by 25%                       Objective measurements completed on examination: See above findings.     Pelvic Floor Special Questions - 03/25/21 0001     Prior Pelvic/Prostate Exam No    Are you Pregnant or attempting pregnancy? Yes    Prior Pregnancies Yes    Number of Pregnancies 1    Number of Vaginal Deliveries 1    Currently Sexually Active Yes    Is this Painful Yes    History of sexually transmitted disease No    Marinoff Scale pain prevents any attempts at intercourse    Urinary Leakage Yes    How often sometimes only with cough or sneeze with pregnancy only    Activities that cause leaking Coughing;Sneezing    Fecal incontinence No    Falling out feeling (prolapse) No    External Perineal Exam deferred                       PT Education - 03/25/21 1706     Education Details Pt educated on how/when to remove tape if there are any skin irritations or itching or when tape starts to fray at edges in 1-3 days. Pt agreed.    Person(s) Educated Patient    Methods Explanation;Demonstration;Tactile cues;Verbal cues    Comprehension Verbalized understanding;Returned demonstration  PT Short Term Goals - 03/25/21 1712       PT SHORT TERM  GOAL #1   Title pt to be I with HEP    Time 3    Period Weeks    Status New    Target Date 04/15/21      PT SHORT TERM GOAL #2   Title pt to report no more than 5/10 pain with ability to stand at least 2 hours for work    Time 3    Period Weeks    Status New    Target Date 04/15/21               PT Long Term Goals - 03/25/21 1712       PT LONG TERM GOAL #1   Title pt to be I with advanced HEP    Time 6    Period Weeks    Status New    Target Date 05/06/21      PT LONG TERM GOAL #2   Title pt to report ability to stand at least 2 hours for work with no more than 3/10 pain at pubic bone with or without use of belly band.    Time 6    Period Weeks    Status New    Target Date 05/06/21      PT LONG TERM GOAL #3   Title pt to demonstrate 5/5 bil hip strength for improved pelvic stability for birth prep.    Time 6    Period Weeks    Status New    Target Date 05/06/21                    Plan - 03/25/21 1624     Clinical Impression Statement Pt is 30yo female current [redacted] weeks pregnant with second child, first is 4yo and was vaginal birth. Pt has been having one month h/o pelvic pain/low back pain coming from low back rounding forward into pubic bone. Pt reports she was told this was round ligament pain but didn't know what this was. Pt reports sometimes pain is stabbing from pubic bone inward and PT showed pt picture of round ligament and pain referral patterns and pt agreed this was where she felt pain, pt also has pain with any single leg activity and made worse with walking. Pt had weakness in bil hips with pain with MMT for hips and had decreased mobility in spine and bil hips, pt had TTP at pubic symphasis and bil groin. Pt denied allergies to adhesives and agreed to attempt taping, reported immedate relief when stood from supine and very pleased with this. Pt also educated on pregnancy band to attempt to decrease pain and support belly. Pt agreed to look into  this, pt reports she sees OBGYN monday for appointment and asked PT about returning to work, pt instructed to ask MD about this and all recommendations, pt agreed. Pt would benefit from additional PT to further address deficits found during eval.    Personal Factors and Comorbidities Comorbidity 1    Comorbidities pregnancy    Stability/Clinical Decision Making Evolving/Moderate complexity    Clinical Decision Making Moderate    Rehab Potential Good    PT Frequency 1x / week    PT Duration 6 weeks    PT Treatment/Interventions ADLs/Self Care Home Management;Aquatic Therapy;Functional mobility training;Therapeutic activities;Therapeutic exercise;Neuromuscular re-education;Manual techniques;Patient/family education;Taping;Scar mobilization;Passive range of motion;Energy conservation    PT Next Visit Plan go over breathing mechanics, belly  band    Consulted and Agree with Plan of Care Patient;Other (Comment)   interpreter present for language barrier            Patient will benefit from skilled therapeutic intervention in order to improve the following deficits and impairments:  Difficulty walking, Pain, Improper body mechanics, Impaired flexibility, Postural dysfunction, Decreased strength, Decreased mobility, Increased fascial restricitons, Increased muscle spasms, Decreased endurance, Decreased coordination  Visit Diagnosis: Abnormal posture - Plan: PT plan of care cert/re-cert  Chronic bilateral low back pain without sciatica - Plan: PT plan of care cert/re-cert  Abnormality of gait and mobility - Plan: PT plan of care cert/re-cert  Other muscle spasm - Plan: PT plan of care cert/re-cert     Problem List Patient Active Problem List   Diagnosis Date Noted   UTI (urinary tract infection) 03/14/2021   BMI 40.0-44.9, adult (HCC) 03/01/2021   Language barrier 03/01/2021   Obesity in pregnancy 03/01/2021   Supervision of high risk pregnancy, antepartum 02/22/2021   History of  postpartum depression, currently pregnant 02/22/2021   History of oligohydramnios in prior pregnancy, currently pregnant 09/14/2016   History of group B Streptococcus (GBS) infection 09/11/2016   GDM (gestational diabetes mellitus) 08/05/2016   Urinary tract infection in mother during second trimester of pregnancy 07/19/2016    Pt educated on pregnancy precautions including but not limited to vaginal bleeding, contractions, leaking of fluid and fetal movement. If any of these occur halt activity and please consult OBGYN for recommended care.     Otelia Sergeant, PT, DPT 02/09/235:16 PM   [facility] @ [facility] [address] [place], [place], [address] Phone: [phone]   Fax:  [phone]  Name: Kristy Nguyen MRN: [id number] Date of Birth: 02/07/1991

## 2021-03-26 ENCOUNTER — Encounter (HOSPITAL_COMMUNITY): Payer: Self-pay | Admitting: Family Medicine

## 2021-03-26 ENCOUNTER — Inpatient Hospital Stay (HOSPITAL_BASED_OUTPATIENT_CLINIC_OR_DEPARTMENT_OTHER): Payer: BC Managed Care – PPO

## 2021-03-26 ENCOUNTER — Inpatient Hospital Stay (HOSPITAL_COMMUNITY)
Admission: AD | Admit: 2021-03-26 | Discharge: 2021-03-26 | Disposition: A | Discharge status: Home / Self Care | Payer: BC Managed Care – PPO | Attending: Family Medicine | Admitting: Family Medicine

## 2021-03-26 DIAGNOSIS — O26892 Other specified pregnancy related conditions, second trimester: Secondary | ICD-10-CM | POA: Diagnosis not present

## 2021-03-26 DIAGNOSIS — N949 Unspecified condition associated with female genital organs and menstrual cycle: Secondary | ICD-10-CM | POA: Diagnosis not present

## 2021-03-26 DIAGNOSIS — R102 Pelvic and perineal pain: Secondary | ICD-10-CM | POA: Diagnosis not present

## 2021-03-26 DIAGNOSIS — R109 Unspecified abdominal pain: Secondary | ICD-10-CM

## 2021-03-26 DIAGNOSIS — Z3A23 23 weeks gestation of pregnancy: Secondary | ICD-10-CM

## 2021-03-26 DIAGNOSIS — O26899 Other specified pregnancy related conditions, unspecified trimester: Secondary | ICD-10-CM

## 2021-03-26 LAB — WET PREP, GENITAL
Clue Cells Wet Prep HPF POC: NONE SEEN
Sperm: NONE SEEN
Trich, Wet Prep: NONE SEEN
WBC, Wet Prep HPF POC: <10 (ref <10)
Yeast Wet Prep HPF POC: NONE SEEN

## 2021-03-26 LAB — URINALYSIS, ROUTINE W REFLEX MICROSCOPIC
Bilirubin Urine: NEGATIVE
Glucose, UA: 50 mg/dL — AB
Hgb urine dipstick: NEGATIVE
Ketones, ur: 5 mg/dL — AB
Nitrite: NEGATIVE
Protein, ur: 30 mg/dL — AB
Specific Gravity, Urine: 1.024 (ref 1.005–1.030)
pH: 6 (ref 5.0–8.0)

## 2021-03-26 MED ORDER — LACTATED RINGERS IV BOLUS
1000.0000 mL | Freq: Once | INTRAVENOUS | Status: AC
  Administered 2021-03-26: 1000 mL via INTRAVENOUS

## 2021-03-26 MED ORDER — IBUPROFEN 600 MG PO TABS
600.0000 mg | ORAL_TABLET | Freq: Once | ORAL | Status: AC
  Administered 2021-03-26: 600 mg via ORAL
  Filled 2021-03-26: qty 1

## 2021-03-26 NOTE — MAU Note (Signed)
Having pain right around belly button since Monday. Also having pain in groin area.  No bleeding. ? More pee comes out after she goes.

## 2021-03-26 NOTE — MAU Provider Note (Signed)
History     CSN: [id number]  Arrival date and time: 03/26/21 1402   Event Date/Time   First Provider Initiated Contact with Patient 03/26/21 1512      Chief Complaint  Patient presents with   Abdominal Pain   HPI  Ms.Kristy Nguyen is a 31 y.o. female [phone] @ [redacted]w[redacted]d here with complaints of abdominal pain. This is the patient's 8th visit to the MAU- previous visits are associated with round ligament pain/ pelvic pain. The pain comes and goes. The pain at times feels like contractions. She currently rates her pain 4/10. She has not taken anything for the pain. Hx of SAB and nervous at times about another loss.  Denies bleeding.   OB History     Gravida  3   Para  1   Term  1   Preterm  0   AB  1   Living  1      SAB  1   IAB  0   Ectopic  0   Multiple      Live Births  1           Past Medical History:  Diagnosis Date   Allergy    Diabetes mellitus without complication ([place])    gestational   Gestational diabetes    Nexplanon insertion 10/28/2016   NSVD (normal spontaneous vaginal delivery) 09/17/2016   Supervision of high-risk pregnancy 04/05/2016    Clinic [facility] Prenatal Labs  Dating lmp and 12.4 week [place] Blood type: --/--/O POS (02/10 1138)   Genetic Screen 1 Screen:    AFP:     Quad:     NIPS: Antibody: negative  Anatomic [place]  Normal, anterior placenta, female  Rubella:  immune  GTT Early:               Third trimester:  Glucose, Fasting 65 - 91 mg/dL 95    Glucose, 1 hour 65 - 179 mg/dL 192    Glucose, 2 hour 65 - 152 mg/dL 150     RPR:   nega    Past Surgical History:  Procedure Laterality Date   DILATION AND EVACUATION  2010    Family History  Problem Relation Age of Onset   Hypertension Mother    Diabetes Father    Heart Problems Father    Anxiety disorder Sister    Depression Sister    Cancer Paternal Uncle        Brain   Diabetes Maternal Grandmother    Diabetes Maternal Grandfather    Diabetes Paternal Grandmother    Diabetes  Paternal Grandfather     Social History   Tobacco Use   Smoking status: Every Day    Packs/day: 0.25    Types: Cigarettes   Smokeless tobacco: Former  Scientific laboratory technician Use: Never used  Substance Use Topics   Alcohol use: No   Drug use: No    Allergies:  Allergies  Allergen Reactions   Fish Allergy Itching and Swelling   Latex Rash   Penicillins Itching, Swelling and Rash    Has patient had a PCN reaction causing immediate rash, facial/tongue/throat swelling, SOB or lightheadedness with hypotension: No Has patient had a PCN reaction causing severe rash involving mucus membranes or skin necrosis: No Has patient had a PCN reaction that required hospitalization No Has patient had a PCN reaction occurring within the last 10 years: No If all of the above answers are "NO", then may proceed with  Cephalosporin use.    Medications Prior to Admission  Medication Sig Dispense Refill Last Dose   Prenatal Vit-Fe Fumarate-FA (PRENATAL PO) Take by mouth.   03/25/2021   sulfamethoxazole-trimethoprim (BACTRIM DS) 800-160 MG tablet Take 1 tablet by mouth 2 (two) times daily. 6 tablet 0 03/25/2021   Accu-Chek Softclix Lancets lancets Use as instructed 100 each 12    aspirin EC 81 MG tablet Take 1 tablet (81 mg total) by mouth daily. 60 tablet 1    Blood Glucose Monitoring Suppl (ACCU-CHEK GUIDE) w/Device KIT 1 Device by Does not apply route in the morning, at noon, in the evening, and at bedtime. 1 kit 0    cyclobenzaprine (FLEXERIL) 5 MG tablet Take 1 tablet (5 mg total) by mouth 3 (three) times daily as needed for muscle spasms. 15 tablet 0    doxylamine, Sleep, (UNISOM) 25 MG tablet Take 25 mg by mouth at bedtime as needed.      glucose blood test strip Use as instructed 100 each 12    metroNIDAZOLE (METROGEL VAGINAL) 0.75 % vaginal gel Place 1 Applicatorful vaginally at bedtime. Insert one applicator, at bedtime, for 5 nights. 70 g 0    pyridoxine (B-6) 100 MG tablet Take 100 mg by mouth  daily.      Results for orders placed or performed during the hospital encounter of 03/26/21 (from the past 48 hour(s))  Urinalysis, Routine w reflex microscopic Urine, Clean Catch     Status: Abnormal   Collection Time: 03/26/21  2:46 PM  Result Value Ref Range   Color, Urine YELLOW YELLOW   APPearance CLOUDY (A) CLEAR   Specific Gravity, Urine 1.024 1.005 - 1.030   pH 6.0 5.0 - 8.0   Glucose, UA 50 (A) NEGATIVE mg/dL   Hgb urine dipstick NEGATIVE NEGATIVE   Bilirubin Urine NEGATIVE NEGATIVE   Ketones, ur 5 (A) NEGATIVE mg/dL   Protein, ur 30 (A) NEGATIVE mg/dL   Nitrite NEGATIVE NEGATIVE   Leukocytes,Ua TRACE (A) NEGATIVE   RBC / HPF 0-5 0 - 5 RBC/hpf   WBC, UA 6-10 0 - 5 WBC/hpf   Bacteria, UA MANY (A) NONE SEEN   Squamous Epithelial / LPF 21-50 0 - 5   Mucus PRESENT     Comment: Performed at [facility], [address], [place], [place] [address]  Wet prep, genital     Status: None   Collection Time: 03/26/21  4:29 PM   Specimen: Vaginal  Result Value Ref Range   Yeast Wet Prep HPF POC NONE SEEN NONE SEEN   Trich, Wet Prep NONE SEEN NONE SEEN   Clue Cells Wet Prep HPF POC NONE SEEN NONE SEEN   WBC, Wet Prep HPF POC <10 <10   Sperm NONE SEEN     Comment: Performed at [facility], [place] [address] [place], [place] [address]    Review of Systems  Constitutional:  Negative for fever.  Gastrointestinal:  Positive for abdominal pain.  Genitourinary:  Negative for dysuria, vaginal bleeding and vaginal discharge.  Physical Exam   Blood pressure (!) 112/58, pulse 97, temperature 98.3 F (36.8 C), temperature source Oral, resp. rate 18, height $RemoveBe'4\' 11"'zNjrQdQta$  (1.499 m), weight 95.6 kg, last menstrual period 10/16/2020, SpO2 98 %, unknown if currently breastfeeding.  Physical Exam Constitutional:      Appearance: She is well-developed.  Abdominal:     Palpations: Abdomen is soft.     Tenderness: There is no abdominal tenderness.  Skin:  General: Skin is warm.      Capillary Refill: Capillary refill takes more than 3 seconds.  Neurological:     Mental Status: She is alert.  Psychiatric:        Mood and Affect: Mood normal.   Fetal Tracing: Baseline: 150 bpm Variability: Moderate  Accelerations: 10x10 Decelerations: Variables  Toco: None  MAU Course  Procedures None  MDM  Ibuprofen 600 mg given Pain 0/10   A:  1. Round ligament pain   2. [redacted] weeks gestation of pregnancy   3. Abdominal pain affecting pregnancy      P:  Discharge home Return to MAU for OB emergencies Continue pregnancy support belt   Yanelle Sousa, Artist Pais, NP 03/27/2021 8:28 PM

## 2021-03-26 NOTE — Discharge Instructions (Signed)

## 2021-03-28 LAB — CULTURE, OB URINE

## 2021-03-29 ENCOUNTER — Ambulatory Visit (HOSPITAL_BASED_OUTPATIENT_CLINIC_OR_DEPARTMENT_OTHER): Payer: BC Managed Care – PPO

## 2021-03-29 ENCOUNTER — Other Ambulatory Visit: Payer: Self-pay | Admitting: *Deleted

## 2021-03-29 ENCOUNTER — Other Ambulatory Visit: Payer: Self-pay

## 2021-03-29 ENCOUNTER — Encounter: Payer: Self-pay | Admitting: *Deleted

## 2021-03-29 ENCOUNTER — Ambulatory Visit (HOSPITAL_BASED_OUTPATIENT_CLINIC_OR_DEPARTMENT_OTHER): Payer: BC Managed Care – PPO | Admitting: Obstetrics and Gynecology

## 2021-03-29 ENCOUNTER — Ambulatory Visit: Payer: BC Managed Care – PPO | Attending: Obstetrics and Gynecology | Admitting: *Deleted

## 2021-03-29 VITALS — BP 112/56 | HR 105

## 2021-03-29 DIAGNOSIS — O0990 Supervision of high risk pregnancy, unspecified, unspecified trimester: Secondary | ICD-10-CM

## 2021-03-29 DIAGNOSIS — O24410 Gestational diabetes mellitus in pregnancy, diet controlled: Secondary | ICD-10-CM

## 2021-03-29 DIAGNOSIS — Z362 Encounter for other antenatal screening follow-up: Secondary | ICD-10-CM

## 2021-03-29 DIAGNOSIS — Z3A23 23 weeks gestation of pregnancy: Secondary | ICD-10-CM | POA: Diagnosis not present

## 2021-03-29 DIAGNOSIS — O99212 Obesity complicating pregnancy, second trimester: Secondary | ICD-10-CM

## 2021-03-29 DIAGNOSIS — Z6841 Body Mass Index (BMI) 40.0 and over, adult: Secondary | ICD-10-CM

## 2021-03-29 DIAGNOSIS — Z87891 Personal history of nicotine dependence: Secondary | ICD-10-CM | POA: Insufficient documentation

## 2021-03-29 DIAGNOSIS — O0992 Supervision of high risk pregnancy, unspecified, second trimester: Secondary | ICD-10-CM | POA: Diagnosis not present

## 2021-03-29 DIAGNOSIS — O24419 Gestational diabetes mellitus in pregnancy, unspecified control: Secondary | ICD-10-CM

## 2021-03-29 LAB — GC/CHLAMYDIA PROBE AMP (~~LOC~~) NOT AT ARMC
Chlamydia: POSITIVE — AB
Comment: NEGATIVE
Comment: NORMAL
Neisseria Gonorrhea: NEGATIVE

## 2021-03-29 NOTE — Progress Notes (Signed)
Maternal-Fetal Medicine   Name: Kristy Nguyen DOB: 1990-11-15 MRN: [id number] Referring Provider: Aletha Halim, MD  I had the pleasure of seeing [facility] at [facility]. She is G3 P1 at 23w 3d gestation and is here for fetal anatomy scan. She has gestational diabetes and will start checking her blood glucose.  She had consultation with our diabetic educator. Obstetric history significant for a term vaginal delivery of a female infant weighing 3,215 grams at birth.  Her delivery was complicated by shoulder dystocia.  The baby does not have neurological injuries. Her pregnancy was complicated by gestational diabetes and oligohydramnios.  She had induction of labor.  Diabetes was well controlled with diet. No history of hypertension or any chronic medical conditions. Medications: Prenatal vitamins Allergies: No known drug allergies. Social history: She stopped smoking about 2 weeks ago.  No history of alcohol or drug use.  Ultrasound Fetal growth is appropriate for gestational age.  Amniotic fluid is normal and good fetal activity seen.  I counseled the patient with help of Spanish language interpreter. Gestational diabetes I explained the diagnosis of gestational diabetes.  I emphasized the importance of good blood glucose control to prevent adverse fetal or neonatal outcomes.  I discussed blood glucose normal values. I encouraged her to check her blood glucose regularly. Possible complications of gestational diabetes include fetal macrosomia, shoulder dystocia and birth injuries, stillbirth (in poorly controlled diabetes) and neonatal respiratory syndrome and other complications.  In about 85% of cases, gestational diabetes is well controlled by diet alone.  Exercise reduces the need for insulin.  Medical treatment includes oral hypoglycemics or insulin. If patient requires insulin or oral hypoglycemics, we recommend weekly antenatal testing till  delivery.  Risk of shoulder dystocia is increased because of shoulder dystocia in previous pregnancy. Shoulder dystocia can occur in non-macrosomic infants but is more-likely seen in pregnancies complicated by diabetes and macrosomia.  Timing of delivery: In well-controlled diabetes on diet, patient can be delivered at 62- or 40-weeks' gestation. Vaginal delivery is not contraindicated. Type 2 diabetes develops in about 25% to 40% of women with GDM. I recommend postpartum screening with 75-g glucose load at 6 to 12 weeks after delivery. Recommendations -An appointment was made for her to return in 4 weeks for completion of fetal anatomy. -Fetal growth assessments every 4 weeks.  Thank you for consultation.  If you have any questions or concerns, please contact me the [facility].  Consultation including face-to-face (more than 50%) counseling 30 minutes.

## 2021-03-30 ENCOUNTER — Encounter: Payer: Self-pay | Admitting: Obstetrics and Gynecology

## 2021-03-30 DIAGNOSIS — O09299 Supervision of pregnancy with other poor reproductive or obstetric history, unspecified trimester: Secondary | ICD-10-CM | POA: Insufficient documentation

## 2021-03-30 DIAGNOSIS — Z8759 Personal history of other complications of pregnancy, childbirth and the puerperium: Secondary | ICD-10-CM | POA: Insufficient documentation

## 2021-03-30 DIAGNOSIS — O3660X0 Maternal care for excessive fetal growth, unspecified trimester, not applicable or unspecified: Secondary | ICD-10-CM | POA: Insufficient documentation

## 2021-04-02 ENCOUNTER — Inpatient Hospital Stay (HOSPITAL_COMMUNITY)
Admission: AD | Admit: 2021-04-02 | Discharge: 2021-04-02 | Disposition: A | Discharge status: Home / Self Care | Payer: BC Managed Care – PPO | Attending: Obstetrics & Gynecology | Admitting: Obstetrics & Gynecology

## 2021-04-02 ENCOUNTER — Encounter (HOSPITAL_COMMUNITY): Payer: Self-pay | Admitting: Obstetrics & Gynecology

## 2021-04-02 DIAGNOSIS — A749 Chlamydial infection, unspecified: Secondary | ICD-10-CM | POA: Diagnosis not present

## 2021-04-02 DIAGNOSIS — O98812 Other maternal infectious and parasitic diseases complicating pregnancy, second trimester: Secondary | ICD-10-CM

## 2021-04-02 DIAGNOSIS — O26899 Other specified pregnancy related conditions, unspecified trimester: Secondary | ICD-10-CM

## 2021-04-02 DIAGNOSIS — Z3A24 24 weeks gestation of pregnancy: Secondary | ICD-10-CM | POA: Diagnosis not present

## 2021-04-02 DIAGNOSIS — R109 Unspecified abdominal pain: Secondary | ICD-10-CM | POA: Diagnosis not present

## 2021-04-02 DIAGNOSIS — R102 Pelvic and perineal pain: Secondary | ICD-10-CM | POA: Diagnosis not present

## 2021-04-02 DIAGNOSIS — O26892 Other specified pregnancy related conditions, second trimester: Secondary | ICD-10-CM | POA: Insufficient documentation

## 2021-04-02 DIAGNOSIS — O368120 Decreased fetal movements, second trimester, not applicable or unspecified: Secondary | ICD-10-CM | POA: Diagnosis not present

## 2021-04-02 LAB — CBC WITH DIFFERENTIAL/PLATELET
Abs Immature Granulocytes: 0.12 10*3/uL — ABNORMAL HIGH (ref 0.00–0.07)
Basophils Absolute: 0.1 10*3/uL (ref 0.0–0.1)
Basophils Relative: 0 %
Eosinophils Absolute: 0.1 10*3/uL (ref 0.0–0.5)
Eosinophils Relative: 1 %
HCT: 33.8 % — ABNORMAL LOW (ref 36.0–46.0)
Hemoglobin: 11 g/dL — ABNORMAL LOW (ref 12.0–15.0)
Immature Granulocytes: 1 %
Lymphocytes Relative: 16 %
Lymphs Abs: 1.9 10*3/uL (ref 0.7–4.0)
MCH: 29.7 pg (ref 26.0–34.0)
MCHC: 32.5 g/dL (ref 30.0–36.0)
MCV: 91.4 fL (ref 80.0–100.0)
Monocytes Absolute: 0.7 10*3/uL (ref 0.1–1.0)
Monocytes Relative: 6 %
Neutro Abs: 9.1 10*3/uL — ABNORMAL HIGH (ref 1.7–7.7)
Neutrophils Relative %: 76 %
Platelets: 287 10*3/uL (ref 150–400)
RBC: 3.7 MIL/uL — ABNORMAL LOW (ref 3.87–5.11)
RDW: 12.6 % (ref 11.5–15.5)
WBC: 11.9 10*3/uL — ABNORMAL HIGH (ref 4.0–10.5)
nRBC: 0 % (ref 0.0–0.2)

## 2021-04-02 LAB — URINALYSIS, ROUTINE W REFLEX MICROSCOPIC
Bilirubin Urine: NEGATIVE
Glucose, UA: NEGATIVE mg/dL
Hgb urine dipstick: NEGATIVE
Ketones, ur: NEGATIVE mg/dL
Nitrite: NEGATIVE
Protein, ur: NEGATIVE mg/dL
Specific Gravity, Urine: 1.013 (ref 1.005–1.030)
pH: 6 (ref 5.0–8.0)

## 2021-04-02 LAB — AMNISURE RUPTURE OF MEMBRANE (ROM) NOT AT ARMC: Amnisure ROM: NEGATIVE

## 2021-04-02 MED ORDER — IBUPROFEN 600 MG PO TABS
600.0000 mg | ORAL_TABLET | Freq: Once | ORAL | Status: AC
  Administered 2021-04-02: 600 mg via ORAL
  Filled 2021-04-02: qty 1

## 2021-04-02 MED ORDER — AZITHROMYCIN 250 MG PO TABS
1000.0000 mg | ORAL_TABLET | Freq: Once | ORAL | Status: AC
  Administered 2021-04-02: 1000 mg via ORAL
  Filled 2021-04-02: qty 4

## 2021-04-02 NOTE — MAU Note (Signed)
..  Kristy Nguyen is a 31 y.o. at [redacted]w[redacted]d here in MAU reporting: states she's having lower abdominal pain x1 week that feels like contractions and it is getting worse. Denies VB. States she continues to have "wet underwear for x1 week with clear fluid". States DFM today since Monday.   Pain score: 7

## 2021-04-02 NOTE — MAU Provider Note (Signed)
History     CSN: [id number]  Arrival date and time: 04/02/21 1306   Event Date/Time   First Provider Initiated Contact with Patient 04/02/21 1401      Chief Complaint  Patient presents with   Abdominal Pain   Contractions   Vaginal Discharge   Rupture of Membranes   Decreased Fetal Movement   HPI  Ms.Kristy Nguyen is a 31 y.o. female [phone] @ 1w0dhere in MAU with complaints of abdominal pain, contractions, and vaginal discharge. She has been to MAU 9 times in this pregnancy for similar complaints. She reports pain that comes and goes in the middle of her lower pelvis. She has not taken anything for the pain.  She reports an increase in vaginal discharge without odor. Denies bleeding.   Of note the patient is + for chlamydia. The patient was called however was not able to be reached. She has not received treatment.   OB History     Gravida  3   Para  1   Term  1   Preterm  0   AB  1   Living  1      SAB  1   IAB  0   Ectopic  0   Multiple      Live Births  1           Past Medical History:  Diagnosis Date   Allergy    Diabetes mellitus without complication ([facility]    gestational   Gestational diabetes    Nexplanon insertion 10/28/2016   NSVD (normal spontaneous vaginal delivery) 09/17/2016   Supervision of high-risk pregnancy 04/05/2016    [facility]  Dating lmp and 12.4 week [place] type: --/--/O POS (02/10 1138)   Genetic Screen 1 Screen:    AFP:     Quad:     NIPS: Antibody: negative  Anatomic [place] Normal, anterior placenta, female  Rubella:  immune  GTT Early:               Third trimester:  Glucose, Fasting 65 - 91 mg/dL 95    Glucose, 1 hour 65 - 179 mg/dL 192    Glucose, 2 hour 65 - 152 mg/dL 150     RPR:   nega    Past Surgical History:  Procedure Laterality Date   DILATION AND EVACUATION  2010    Family History  Problem Relation Age of Onset   Hypertension Mother    Diabetes Father    Heart Problems Father     Anxiety disorder Sister    Depression Sister    Cancer Paternal Uncle        Brain   Diabetes Maternal Grandmother    Diabetes Maternal Grandfather    Diabetes Paternal Grandmother    Diabetes Paternal Grandfather     Social History   Tobacco Use   Smoking status: Every Day    Packs/day: 0.25    Types: Cigarettes   Smokeless tobacco: Former  VScientific laboratory technicianUse: Never used  Substance Use Topics   Alcohol use: No   Drug use: No    Allergies:  Allergies  Allergen Reactions   Fish Allergy Itching and Swelling   Latex Rash   Penicillins Itching, Swelling and Rash    Has patient had a PCN reaction causing immediate rash, facial/tongue/throat swelling, SOB or lightheadedness with hypotension: No Has patient had a PCN reaction causing severe rash involving mucus membranes or  skin necrosis: No Has patient had a PCN reaction that required hospitalization No Has patient had a PCN reaction occurring within the last 10 years: No If all of the above answers are "NO", then may proceed with Cephalosporin use.    Medications Prior to Admission  Medication Sig Dispense Refill Last Dose   Prenatal Vit-Fe Fumarate-FA (PRENATAL PO) Take by mouth.   04/01/2021   Accu-Chek Softclix Lancets lancets Use as instructed 100 each 12    aspirin EC 81 MG tablet Take 1 tablet (81 mg total) by mouth daily. 60 tablet 1    Blood Glucose Monitoring Suppl (ACCU-CHEK GUIDE) w/Device KIT 1 Device by Does not apply route in the morning, at noon, in the evening, and at bedtime. 1 kit 0    cyclobenzaprine (FLEXERIL) 5 MG tablet Take 1 tablet (5 mg total) by mouth 3 (three) times daily as needed for muscle spasms. 15 tablet 0    doxylamine, Sleep, (UNISOM) 25 MG tablet Take 25 mg by mouth at bedtime as needed.      glucose blood test strip Use as instructed 100 each 12    metroNIDAZOLE (METROGEL VAGINAL) 0.75 % vaginal gel Place 1 Applicatorful vaginally at bedtime. Insert one applicator, at bedtime, for 5  nights. 70 g 0    pyridoxine (B-6) 100 MG tablet Take 100 mg by mouth daily.      sulfamethoxazole-trimethoprim (BACTRIM DS) 800-160 MG tablet Take 1 tablet by mouth 2 (two) times daily. (Patient not taking: Reported on 03/29/2021) 6 tablet 0    Results for orders placed or performed during the hospital encounter of 04/02/21 (from the past 48 hour(s))  Urinalysis, Routine w reflex microscopic Urine, Clean Catch     Status: Abnormal   Collection Time: 04/02/21  1:44 PM  Result Value Ref Range   Color, Urine YELLOW YELLOW   APPearance CLOUDY (A) CLEAR   Specific Gravity, Urine 1.013 1.005 - 1.030   pH 6.0 5.0 - 8.0   Glucose, UA NEGATIVE NEGATIVE mg/dL   Hgb urine dipstick NEGATIVE NEGATIVE   Bilirubin Urine NEGATIVE NEGATIVE   Ketones, ur NEGATIVE NEGATIVE mg/dL   Protein, ur NEGATIVE NEGATIVE mg/dL   Nitrite NEGATIVE NEGATIVE   Leukocytes,Ua LARGE (A) NEGATIVE   RBC / HPF 0-5 0 - 5 RBC/hpf   WBC, UA 21-50 0 - 5 WBC/hpf   Bacteria, UA MANY (A) NONE SEEN   Squamous Epithelial / LPF 11-20 0 - 5   WBC Clumps PRESENT    Mucus PRESENT    Non Squamous Epithelial 0-5 (A) NONE SEEN    Comment: Performed at [facility], [place] [address], [place], [place] [address]  CBC with Differential/Platelet     Status: Abnormal   Collection Time: 04/02/21  2:25 PM  Result Value Ref Range   WBC 11.9 (H) 4.0 - 10.5 K/uL   RBC 3.70 (L) 3.87 - 5.11 MIL/uL   Hemoglobin 11.0 (L) 12.0 - 15.0 g/dL   HCT 33.8 (L) 36.0 - 46.0 %   MCV 91.4 80.0 - 100.0 fL   MCH 29.7 26.0 - 34.0 pg   MCHC 32.5 30.0 - 36.0 g/dL   RDW 12.6 11.5 - 15.5 %   Platelets 287 150 - 400 K/uL   nRBC 0.0 0.0 - 0.2 %   Neutrophils Relative % 76 %   Neutro Abs 9.1 (H) 1.7 - 7.7 K/uL   Lymphocytes Relative 16 %   Lymphs Abs 1.9 0.7 - 4.0 K/uL   Monocytes  Relative 6 %   Monocytes Absolute 0.7 0.1 - 1.0 K/uL   Eosinophils Relative 1 %   Eosinophils Absolute 0.1 0.0 - 0.5 K/uL   Basophils Relative 0 %   Basophils Absolute 0.1 0.0 -  0.1 K/uL   Immature Granulocytes 1 %   Abs Immature Granulocytes 0.12 (H) 0.00 - 0.07 K/uL    Comment: Performed at [facility] [address], [place], [place] [address]  Amnisure rupture of membrane (rom)not at [facility]     Status: None   Collection Time: 04/02/21  3:46 PM  Result Value Ref Range   Amnisure ROM NEGATIVE     Comment: Performed at [facility], [address], [place], [place] [address]    Review of Systems  Constitutional:  Negative for fever.  Gastrointestinal:  Positive for abdominal pain. Negative for nausea and vomiting.  Genitourinary:  Positive for pelvic pain. Negative for dysuria, frequency and urgency.  Physical Exam   Blood pressure 124/70, pulse (!) 108, temperature 98.3 F (36.8 C), temperature source Oral, resp. rate 18, last menstrual period 10/16/2020, SpO2 97 %, unknown if currently breastfeeding.  Physical Exam Vitals and nursing note reviewed.  Constitutional:      General: She is not in acute distress.    Appearance: She is well-developed. She is not ill-appearing, toxic-appearing or diaphoretic.  Abdominal:     Tenderness: There is abdominal tenderness in the suprapubic area. There is no guarding or rebound.  Genitourinary:    Comments: Dilation: Closed Exam by:: Bailey Mech, NP  Amnisure collected  Skin:    General: Skin is warm.  Neurological:     Mental Status: She is alert and oriented to person, place, and time.   Fetal Tracing: Baseline: 150 bpm Variability: Moderate  Accelerations: 10x10 Decelerations: None Toco:  Occasional UI   MAU Course  Procedures  MDM  Azithromycin 1 gram given PO  Ibuprofen given 600 mg PO Reviewed patient in detail with Dr. Harolyn Rutherford  UA with urine culture   Assessment and Plan   A:  1. Pelvic pain in pregnancy, antepartum   2. Chlamydia infection affecting pregnancy in second trimester   3. [redacted] weeks gestation of pregnancy      P:  Discharge home Partner needs treatment Urine  culture pending Return to MAU for OB emergencies   Kristy Nguyen 04/02/2021, 4:12 PM

## 2021-04-02 NOTE — Progress Notes (Signed)
1700- Patient discharged to home, documents reviewed. Patient requesting note for work. Note given for today and last Friday when she was seen in MAU.Reviewed with patient her follow-up appointments, recheck Chlamydia labs in 4 weeks, and return in any VB, or preterm labor. Patient verbalizes understanding.

## 2021-04-04 ENCOUNTER — Other Ambulatory Visit: Payer: Self-pay | Admitting: Obstetrics and Gynecology

## 2021-04-04 ENCOUNTER — Telehealth: Payer: Self-pay | Admitting: Obstetrics and Gynecology

## 2021-04-04 LAB — CULTURE, OB URINE: Culture: >=100000 — AB

## 2021-04-04 MED ORDER — CEFADROXIL 500 MG PO CAPS: 500.0000 mg | ORAL_CAPSULE | Freq: Two times a day (BID) | ORAL | 0 refills | Status: AC

## 2021-04-04 NOTE — Telephone Encounter (Signed)
+   Urine culture. Attempted to call patient. Left message. Rx sent.   Rx: Josie Dixon, NP 04/04/2021 5:55 PM

## 2021-04-05 ENCOUNTER — Telehealth: Payer: Self-pay | Admitting: Family Medicine

## 2021-04-05 NOTE — Telephone Encounter (Signed)
Called pt with Spanish Interpreter Raquel M., and pt informed me that she has been having pain in her lower abdomen for the past month.  Pt reports that it is at her belly button and below her belly.  I advised pt that her it seems normal at this time.  I advised pt to go to her PT appt and that the provider will f/u with her at her OB appt on 04/08/21.  Pt verbalized understanding.   Addison Naegeli, RN  04/05/21

## 2021-04-05 NOTE — Telephone Encounter (Signed)
Patient state she has been having ABD pain x 1 month, needing to speak to a nurse

## 2021-04-06 ENCOUNTER — Ambulatory Visit: Payer: BC Managed Care – PPO | Admitting: Physical Therapy

## 2021-04-06 ENCOUNTER — Telehealth: Payer: Self-pay | Admitting: Physical Therapy

## 2021-04-06 NOTE — Telephone Encounter (Signed)
PT called pt about today's appointment at 1615. Pt did not answer, voicemail left through interpreter services.   Otelia Sergeant, PT, DPT 02/21/234:35 PM

## 2021-04-08 ENCOUNTER — Encounter: Payer: Self-pay | Admitting: Obstetrics and Gynecology

## 2021-04-08 ENCOUNTER — Other Ambulatory Visit: Payer: Self-pay

## 2021-04-08 ENCOUNTER — Ambulatory Visit (INDEPENDENT_AMBULATORY_CARE_PROVIDER_SITE_OTHER): Payer: BC Managed Care – PPO | Admitting: Obstetrics and Gynecology

## 2021-04-08 ENCOUNTER — Encounter: Payer: Self-pay | Admitting: Family Medicine

## 2021-04-08 VITALS — BP 110/66 | HR 105 | Wt 208.2 lb

## 2021-04-08 DIAGNOSIS — O3662X0 Maternal care for excessive fetal growth, second trimester, not applicable or unspecified: Secondary | ICD-10-CM

## 2021-04-08 DIAGNOSIS — O09299 Supervision of pregnancy with other poor reproductive or obstetric history, unspecified trimester: Secondary | ICD-10-CM

## 2021-04-08 DIAGNOSIS — O99210 Obesity complicating pregnancy, unspecified trimester: Secondary | ICD-10-CM

## 2021-04-08 DIAGNOSIS — Z789 Other specified health status: Secondary | ICD-10-CM

## 2021-04-08 DIAGNOSIS — O24419 Gestational diabetes mellitus in pregnancy, unspecified control: Secondary | ICD-10-CM

## 2021-04-08 DIAGNOSIS — Z758 Other problems related to medical facilities and other health care: Secondary | ICD-10-CM

## 2021-04-08 DIAGNOSIS — O98319 Other infections with a predominantly sexual mode of transmission complicating pregnancy, unspecified trimester: Secondary | ICD-10-CM | POA: Insufficient documentation

## 2021-04-08 DIAGNOSIS — Z6841 Body Mass Index (BMI) 40.0 and over, adult: Secondary | ICD-10-CM

## 2021-04-08 DIAGNOSIS — O0990 Supervision of high risk pregnancy, unspecified, unspecified trimester: Secondary | ICD-10-CM

## 2021-04-08 DIAGNOSIS — A568 Sexually transmitted chlamydial infection of other sites: Secondary | ICD-10-CM | POA: Insufficient documentation

## 2021-04-08 DIAGNOSIS — Z3A24 24 weeks gestation of pregnancy: Secondary | ICD-10-CM

## 2021-04-08 DIAGNOSIS — Z634 Disappearance and death of family member: Secondary | ICD-10-CM

## 2021-04-08 DIAGNOSIS — O2342 Unspecified infection of urinary tract in pregnancy, second trimester: Secondary | ICD-10-CM

## 2021-04-08 NOTE — Progress Notes (Signed)
PRENATAL VISIT NOTE  Subjective:  Kristy Nguyen is a 31 y.o. G3P1011 at [redacted]w[redacted]d being seen today for ongoing prenatal care.  She is currently monitored for the following issues for this high-risk pregnancy and has Urinary tract infection in mother during second trimester of pregnancy; GDM (gestational diabetes mellitus); History of group B Streptococcus (GBS) infection; History of oligohydramnios in prior pregnancy, currently pregnant; Supervision of high risk pregnancy, antepartum; History of postpartum depression, currently pregnant; BMI 40.0-44.9, adult (HCC); Language barrier; Obesity in pregnancy; LGA (large for gestational age) fetus affecting management of mother; History of shoulder dystocia in prior pregnancy, currently pregnant; and Chlamydia trachomatis infection in pregnancy on their problem list.  Patient reports  roommate passed away this week so pt not checking sugars .  Contractions: Irritability. Vag. Bleeding: None.  Movement: Present. Denies leaking of fluid.   The following portions of the patient's history were reviewed and updated as appropriate: allergies, current medications, past family history, past medical history, past social history, past surgical history and problem list.   Objective:   Vitals:   04/08/21 1336  BP: 110/66  Pulse: (!) 105  Weight: 208 lb 3.2 oz (94.4 kg)    Fetal Status: Fetal Heart Rate (bpm): 160   Movement: Present     General:  Alert, oriented and cooperative. Patient is in no acute distress.  Skin: Skin is warm and dry. No rash noted.   Cardiovascular: Normal heart rate noted  Respiratory: Normal respiratory effort, no problems with respiration noted  Abdomen: Soft, gravid, appropriate for gestational age.  Pain/Pressure: Present     Pelvic: Cervical exam deferred        Extremities: Normal range of motion.  Edema: None  Mental Status: Normal mood and affect. Normal behavior. Normal judgment and thought content.   Assessment  and Plan:  Pregnancy: G3P1011 at [redacted]w[redacted]d 1. Recent bereavement - Ambulatory referral to [facility] Behavior[facility]  2. [redacted] weeks gestation of pregnancy Patient has had five MAU visits since her transfer of care visit on 1/16, all for abdominal pain. Exam declined today. She was diagnosed with a UCx+ UTI at her last visit but has not taken the abx sent in on 2/19. I told her to please take them as this could be causing her s/s and if it worsens, it could affect her kidneys.   She was also diagnosed with chlamydia and treated in the MAU  Importance of good sugar control to help with fetal weight as this can help with her s/s.  Patient to continue with PT  Recommend q2wk visits  3. Gestational diabetes mellitus (GDM) in second trimester, gestational diabetes method of control unspecified Importance of checking sugars d/w her. F/u in one week  4. Urinary tract infection in mother during second trimester of pregnancy See above  5. Supervision of high risk pregnancy, antepartum LGA at 2/13 anatomy u/s. F/u 3/14 rpt u/s  6. Obesity in pregnancy  7. BMI 40.0-44.9, adult (HCC)  8. Excessive fetal growth affecting management of pregnancy in second trimester, single or unspecified fetus See above  9. Language barrier Interpreter used  10. History of shoulder dystocia in prior pregnancy, currently pregnant  11. Chlamydia Toc mid march   labor symptoms and general obstetric precautions including but not limited to vaginal bleeding, contractions, leaking of fluid and fetal movement were reviewed in detail with the patient. Please refer to After Visit Summary for other counseling recommendations.   Return in about 1 week (around  04/15/2021) for in person, high risk ob, md visit.  Future Appointments  Date Time Provider [place]  04/14/2021  4:15 PM Junie Panning, PT [facility] None  04/19/2021  1:35 PM Kieth Brightly [facility]  04/22/2021  4:15 PM Junie Panning, PT  [facility] None  04/26/2021  4:15 PM Junie Panning, PT [facility] None  04/27/2021  3:30 PM [facility] NURSE [facility]  04/27/2021  3:45 PM [facility] US5 [facility]  05/03/2021  4:15 PM Junie Panning, PT [facility] None  05/10/2021  4:15 PM Junie Panning, PT [facility] None    Aletha Halim, MD

## 2021-04-14 ENCOUNTER — Ambulatory Visit: Payer: BC Managed Care – PPO | Attending: Family Medicine | Admitting: Physical Therapy

## 2021-04-14 ENCOUNTER — Other Ambulatory Visit: Payer: Self-pay

## 2021-04-14 DIAGNOSIS — M62838 Other muscle spasm: Secondary | ICD-10-CM | POA: Diagnosis not present

## 2021-04-14 DIAGNOSIS — R293 Abnormal posture: Secondary | ICD-10-CM | POA: Insufficient documentation

## 2021-04-14 DIAGNOSIS — R269 Unspecified abnormalities of gait and mobility: Secondary | ICD-10-CM | POA: Diagnosis not present

## 2021-04-14 NOTE — Therapy (Signed)
?[facility] [place] ?[place], [place], [address] ?Phone: [phone]   Fax:  [phone] ? ?Physical Therapy Treatment ? ?Patient Details  ?Name: Lametria Bigalke ?MRN: [id number] ?Date of Birth: 1991-02-05 ?Referring Provider (PT): Renard Matter, MD 05/23/2021 ? ? ?Encounter Date: 04/14/2021 ? ? PT End of Session - 04/14/21 1614   ? ? Visit Number 2   ? Date for PT Re-Evaluation 05/23/21   ? Authorization Type BCBS   ? PT Start Time 1610   ? PT Stop Time [id number]   ? PT Time Calculation (min) 40 min   ? Activity Tolerance Patient tolerated treatment well   ? Behavior During Therapy [facility] for tasks assessed/performed   ? ?  ?  ? ?  ? ? ?Past Medical History:  ?Diagnosis Date  ? Allergy   ? Diabetes mellitus without complication ([place])   ? gestational  ? Gestational diabetes   ? Nexplanon insertion 10/28/2016  ? NSVD (normal spontaneous vaginal delivery) 09/17/2016  ? Supervision of high-risk pregnancy 04/05/2016  ?  Clinic [facility] Prenatal Labs  Dating lmp and 12.4 week [place] Blood type: --/--/O POS (02/10 1138)   Genetic Screen 1? Screen:    AFP:     Quad:     NIPS: Antibody: negative  Anatomic [place]  Normal, anterior placenta, female  Rubella:  immune  GTT Early:               Third trimester:  Glucose, Fasting 65 - 91 mg/dL 95    Glucose, 1 hour 65 - 179 mg/dL 192    Glucose, 2 hour 65 - 152 mg/dL 150     RPR:   nega  ? ? ?Past Surgical History:  ?Procedure Laterality Date  ? DILATION AND EVACUATION  2010  ? ? ?There were no vitals filed for this visit. ? ? ? ? ? ? ? ? ? ? ? ? ? ? ? ? ? ? ? ? ? [facility] Adult PT Treatment/Exercise - 04/14/21 0001   ? ?  ? Exercises  ? Exercises Lumbar;Knee/Hip   ?  ? Lumbar Exercises: Stretches  ? Other Lumbar Stretch Exercise sitting on ball: pelvic tilts x10 ant/post; x10 rt/lt; x10 figure 8s; hip shifts x10 x5s   ? Other Lumbar Stretch Exercise thoracic openers x45s each side in sidelying; single leg butterfly 2x30s   ?  ? Lumbar Exercises:  Quadruped  ? Madcat/Old Horse 10 reps   ? Madcat/Old Horse Limitations max VC for techniques with noted difficulty and poor coodination with lumbar and pelvic mobility   ? Other Quadruped Lumbar Exercises tail wags x10; diaphragmatic breathes   ?  ? Manual Therapy  ? Manual Therapy Taping   ? Manual therapy comments x5 stripes of K tape with 25% upward pull for at abdomen for improved support and decreased forward pull with growth of stomach girth and to decrease pain. pt denied any adverse reactions to taping last session.   ? ?  ?  ? ?  ? ? ? ? ? ? ? ? ? ? ? ? PT Short Term Goals - 03/25/21 1712   ? ?  ? PT SHORT TERM GOAL #1  ? Title pt to be I with HEP   ? Time 3   ? Period Weeks   ? Status New   ? Target Date 04/15/21   ?  ? PT SHORT TERM GOAL #2  ? Title pt to report no  more than 5/10 pain with ability to stand at least 2 hours for work   ? Time 3   ? Period Weeks   ? Status New   ? Target Date 04/15/21   ? ?  ?  ? ?  ? ? ? ? PT Long Term Goals - 03/25/21 1712   ? ?  ? PT LONG TERM GOAL #1  ? Title pt to be I with advanced HEP   ? Time 6   ? Period Weeks   ? Status New   ? Target Date 05/06/21   ?  ? PT LONG TERM GOAL #2  ? Title pt to report ability to stand at least 2 hours for work with no more than 3/10 pain at pubic bone with or without use of belly band.   ? Time 6   ? Period Weeks   ? Status New   ? Target Date 05/06/21   ?  ? PT LONG TERM GOAL #3  ? Title pt to demonstrate 5/5 bil hip strength for improved pelvic stability for birth prep.   ? Time 6   ? Period Weeks   ? Status New   ? Target Date 05/06/21   ? ?  ?  ? ?  ? ? ? ? ? ? ? ? ? ?Patient will benefit from skilled therapeutic intervention in order to improve the following deficits and impairments:    ? ?Visit Diagnosis: ?Other muscle spasm ? ?Abnormal posture ? ?Abnormality of gait and mobility ? ? ? ? ?Problem List ?Patient Active Problem List  ? Diagnosis Date Noted  ? Chlamydia trachomatis infection in pregnancy 04/08/2021  ? LGA (large for  gestational age) fetus affecting management of mother 03/30/2021  ? History of shoulder dystocia in prior pregnancy, currently pregnant 03/30/2021  ? BMI 40.0-44.9, adult ([place]) 03/01/2021  ? Language barrier 03/01/2021  ? Obesity in pregnancy 03/01/2021  ? Supervision of high risk pregnancy, antepartum 02/22/2021  ? History of postpartum depression, currently pregnant 02/22/2021  ? History of oligohydramnios in prior pregnancy, currently pregnant 09/14/2016  ? History of group B Streptococcus (GBS) infection 09/11/2016  ? GDM (gestational diabetes mellitus) 08/05/2016  ? Urinary tract infection in mother during second trimester of pregnancy 07/19/2016  ? ? ?Junie Panning, PT ?04/14/2021, 5:01 PM ? ?[facility] [place] ?[place], [place], [address] ?Phone: [phone]   Fax:  [phone] ? ?Name: Shadie Pawlowski ?MRN: [id number] ?Date of Birth: February 20, 1990 ? ? ? ?

## 2021-04-14 NOTE — Therapy (Signed)
[place] @ [address] [place], [place], [address] Phone: [phone]   Fax:  [phone]  Physical Therapy Treatment  Patient Details  Name: Kristy Nguyen MRN: [id number] Date of Birth: 01/01/1991 Referring Provider (PT): Renard Matter, MD 05/23/2021   Encounter Date: 04/14/2021   PT End of Session - 04/14/21 1614     Visit Number 2    Date for PT Re-Evaluation 05/23/21    Authorization Type BCBS    PT Start Time 1610    PT Stop Time 1650    PT Time Calculation (min) 40 min    Activity Tolerance Patient tolerated treatment well    Behavior During Therapy [facility] for tasks assessed/performed             Past Medical History:  Diagnosis Date   Allergy    Diabetes mellitus without complication ([place])    gestational   Gestational diabetes    Nexplanon insertion 10/28/2016   NSVD (normal spontaneous vaginal delivery) 09/17/2016   Supervision of high-risk pregnancy 04/05/2016    Clinic [facility] Prenatal Labs  Dating lmp and 12.4 week [place] Blood type: --/--/O POS (02/10 1138)   Genetic Screen 1 Screen:    AFP:     Quad:     NIPS: Antibody: negative  Anatomic [place]  Normal, anterior placenta, female  Rubella:  immune  GTT Early:               Third trimester:  Glucose, Fasting 65 - 91 mg/dL 95    Glucose, 1 hour 65 - 179 mg/dL 192    Glucose, 2 hour 65 - 152 mg/dL 150     RPR:   nega    Past Surgical History:  Procedure Laterality Date   DILATION AND EVACUATION  2010    There were no vitals filed for this visit.   Subjective Assessment - 04/14/21 1701     Subjective Pt reports she has still had pain but was able to return to work however pain does worsen with increased time in standing by the end of her 8 hour shift. Pt reports she is unable to sit unless on break from her job.    Pertinent History pregnant, high risk pregnancy, h/o miscarriage  03/16/21 - dc'd from hospital with contractions, pelvic pain, LBP ~1/27, fall at home  normal dopplers, UTI and BC (+) dx that hospitalization. high risk-pregnancy, GDM, h/o group B strep infection, oligohydramnios in prior pregnany, spotting affecting pregnancy, postpartum depression, language barrier, obesity in pregnancy    Currently in Pain? Yes    Pain Score 7     Pain Location Pelvis    Pain Orientation Mid;Lower                               [facility] Adult PT Treatment/Exercise - 04/14/21 0001       Exercises   Exercises Lumbar;Knee/Hip      Lumbar Exercises: Stretches   Other Lumbar Stretch Exercise sitting on ball: pelvic tilts x10 ant/post; x10 rt/lt; x10 figure 8s; hip shifts x10 x5s    Other Lumbar Stretch Exercise thoracic openers x45s each side in sidelying; single leg butterfly 2x30s      Lumbar Exercises: Quadruped   Madcat/Old Horse 10 reps    Madcat/Old Horse Limitations max VC for techniques with noted difficulty and poor coodination with lumbar and pelvic mobility    Other Quadruped Lumbar  Exercises tail wags x10; diaphragmatic breathes      Manual Therapy   Manual Therapy Taping    Manual therapy comments x5 stripes of K tape with 25% upward pull for at abdomen for improved support and decreased forward pull with growth of stomach girth and to decrease pain. pt denied any adverse reactions to taping last session.                     PT Education - 04/14/21 1703     Education Details Pt educated on HEP and how to complete all exercises as these were completed during sessoin    Person(s) Educated Patient    Methods Explanation;Demonstration;Tactile cues;Verbal cues;Handout    Comprehension Returned demonstration;Verbalized understanding              PT Short Term Goals - 03/25/21 1712       PT SHORT TERM GOAL #1   Title pt to be I with HEP    Time 3    Period Weeks    Status New    Target Date 04/15/21      PT SHORT TERM GOAL #2   Title pt to report no more than 5/10 pain with ability to stand at least  2 hours for work    Time 3    Period Weeks    Status New    Target Date 04/15/21               PT Long Term Goals - 03/25/21 1712       PT LONG TERM GOAL #1   Title pt to be I with advanced HEP    Time 6    Period Weeks    Status New    Target Date 05/06/21      PT LONG TERM GOAL #2   Title pt to report ability to stand at least 2 hours for work with no more than 3/10 pain at pubic bone with or without use of belly band.    Time 6    Period Weeks    Status New    Target Date 05/06/21      PT LONG TERM GOAL #3   Title pt to demonstrate 5/5 bil hip strength for improved pelvic stability for birth prep.    Time 6    Period Weeks    Status New    Target Date 05/06/21                   Plan - 04/14/21 1703     Clinical Impression Statement Pt presents to clinic reporting she still has pain and worse with increased activity though initially changing position, walking does help pain. Pt session focused on stretching at hips and spine for improved posture and mobility cues for pelvic mobility and posture throughout. Pt reports sitting on ball was very helpful for pain as well as reporting 7/10 pain at start of session and 3/10 pain at end of session. Taping at abdomen also provided by PT for improved pain levels with upward 25% lift with K tape. Pt reported this really helped her pain last session. Interpreter present throughout session and used for language. Pt tolerated well and reported feeling much better at end of session.    Personal Factors and Comorbidities Comorbidity 1    Comorbidities pregnancy    Stability/Clinical Decision Making Evolving/Moderate complexity    Rehab Potential Good    PT Frequency 1x / week  PT Duration 6 weeks    PT Treatment/Interventions ADLs/Self Care Home Management;Aquatic Therapy;Functional mobility training;Therapeutic activities;Therapeutic exercise;Neuromuscular re-education;Manual techniques;Patient/family  education;Taping;Scar mobilization;Passive range of motion;Energy conservation    PT Next Visit Plan go over breathing mechanics, belly band    Consulted and Agree with Plan of Care Patient;Other (Comment)   interpreter present for language barrier            Patient will benefit from skilled therapeutic intervention in order to improve the following deficits and impairments:  Difficulty walking, Pain, Improper body mechanics, Impaired flexibility, Postural dysfunction, Decreased strength, Decreased mobility, Increased fascial restricitons, Increased muscle spasms, Decreased endurance, Decreased coordination  Visit Diagnosis: Other muscle spasm  Abnormal posture  Abnormality of gait and mobility     Problem List Patient Active Problem List   Diagnosis Date Noted   Chlamydia trachomatis infection in pregnancy 04/08/2021   LGA (large for gestational age) fetus affecting management of mother 03/30/2021   History of shoulder dystocia in prior pregnancy, currently pregnant 03/30/2021   BMI 40.0-44.9, adult ([place]) 03/01/2021   Language barrier 03/01/2021   Obesity in pregnancy 03/01/2021   Supervision of high risk pregnancy, antepartum 02/22/2021   History of postpartum depression, currently pregnant 02/22/2021   History of oligohydramnios in prior pregnancy, currently pregnant 09/14/2016   History of group B Streptococcus (GBS) infection 09/11/2016   GDM (gestational diabetes mellitus) 08/05/2016   Urinary tract infection in mother during second trimester of pregnancy 07/19/2016    Stacy Gardner, PT, DPT 03/01/235:06 PM   [place] @ [address] [place], [place], [address] Phone: [phone]   Fax:  [phone]  Name: Addelynn Bayes MRN: [id number] Date of Birth: 01/01/1991

## 2021-04-16 ENCOUNTER — Encounter (HOSPITAL_COMMUNITY): Payer: Self-pay | Admitting: Obstetrics & Gynecology

## 2021-04-16 ENCOUNTER — Inpatient Hospital Stay (HOSPITAL_COMMUNITY)
Admission: AD | Admit: 2021-04-16 | Discharge: 2021-04-16 | Disposition: A | Discharge status: Home / Self Care | Payer: BC Managed Care – PPO | Attending: Obstetrics & Gynecology | Admitting: Obstetrics & Gynecology

## 2021-04-16 DIAGNOSIS — Z3689 Encounter for other specified antenatal screening: Secondary | ICD-10-CM | POA: Insufficient documentation

## 2021-04-16 DIAGNOSIS — O4702 False labor before 37 completed weeks of gestation, second trimester: Secondary | ICD-10-CM | POA: Insufficient documentation

## 2021-04-16 DIAGNOSIS — K219 Gastro-esophageal reflux disease without esophagitis: Secondary | ICD-10-CM | POA: Insufficient documentation

## 2021-04-16 DIAGNOSIS — O99613 Diseases of the digestive system complicating pregnancy, third trimester: Secondary | ICD-10-CM | POA: Diagnosis not present

## 2021-04-16 DIAGNOSIS — O99612 Diseases of the digestive system complicating pregnancy, second trimester: Secondary | ICD-10-CM | POA: Diagnosis not present

## 2021-04-16 DIAGNOSIS — O479 False labor, unspecified: Secondary | ICD-10-CM | POA: Diagnosis not present

## 2021-04-16 DIAGNOSIS — O24410 Gestational diabetes mellitus in pregnancy, diet controlled: Secondary | ICD-10-CM | POA: Insufficient documentation

## 2021-04-16 DIAGNOSIS — O0990 Supervision of high risk pregnancy, unspecified, unspecified trimester: Secondary | ICD-10-CM

## 2021-04-16 DIAGNOSIS — Z3A26 26 weeks gestation of pregnancy: Secondary | ICD-10-CM | POA: Insufficient documentation

## 2021-04-16 LAB — URINALYSIS, ROUTINE W REFLEX MICROSCOPIC
Bilirubin Urine: NEGATIVE
Glucose, UA: 150 mg/dL — AB
Hgb urine dipstick: NEGATIVE
Ketones, ur: NEGATIVE mg/dL
Nitrite: POSITIVE — AB
Protein, ur: NEGATIVE mg/dL
Specific Gravity, Urine: 1.019 (ref 1.005–1.030)
pH: 6 (ref 5.0–8.0)

## 2021-04-16 LAB — GLUCOSE, CAPILLARY: Glucose-Capillary: 91 mg/dL (ref 70–99)

## 2021-04-16 MED ORDER — FAMOTIDINE 20 MG PO TABS
40.0000 mg | ORAL_TABLET | Freq: Once | ORAL | Status: AC
  Administered 2021-04-16: 40 mg via ORAL
  Filled 2021-04-16: qty 2

## 2021-04-16 MED ORDER — FAMOTIDINE 20 MG PO TABS: 20.0000 mg | ORAL_TABLET | Freq: Every day | ORAL | 3 refills | Status: DC

## 2021-04-16 NOTE — MAU Note (Incomplete)
.  Kristy Nguyen is a 31 y.o. at [redacted]w[redacted]d here in MAU reporting: started having pain in her mid abd feels like ctx . Started earlier today 10am. Pain comes and goes. C/o heaviness in her legs as well making it hard to walk and stand.  Reports fetal movement less than usual. Denies any vag bleeding or discharge.  ?Onset of complaint: 10am ?Pain score: *** ?There were no vitals filed for this visit.   ?FHT:*** ?Lab orders placed from triage: u/a  ? ?

## 2021-04-16 NOTE — Discharge Instructions (Signed)
PLEASE BRING GLUCOSE LOG TO APPOINTMENTS!! ? ?

## 2021-04-16 NOTE — MAU Provider Note (Addendum)
Event Date/Time  ?First Provider Initiated Contact with Patient 04/16/21 1848   ?  ?S ?Ms. Kristy Nguyen is a 31 y.o. G4P1011 pregnant female at [redacted]w[redacted]d who presents to MAU today with complaint of tightening of her abdomen with some pain this morning while walking around at work, began at 10am, comes and goes about every . None since admission to MAU. Having some nausea especially when laying down, feels like vomit is in her throat but her diclegis helps the constant nausea. Reports heaviness in her legs when walking. Concerned about feeling dizzy and hot last night when laying down. Says she has been testing her glucose and "they (levels) be high". No other physical complaints, denies vaginal bleeding, LOF, decreased fetal movement, fever, falls or recent illness. ? ?Receives care at [facility] (transferred in from [facility]), prenatal records reviewed ? ?Pertinent items noted in HPI and remainder of comprehensive ROS otherwise negative.  ? ?O ?BP 106/69 (BP Location: Left Arm)   Pulse (!) 102   Temp 98 ?F (36.7 ?C)   Resp 16   Ht 4\' 11"  (1.499 m)   Wt 214 lb (97.1 kg)   LMP 10/16/2020   BMI 43.22 kg/m?  ?Physical Exam ?Vitals and nursing note reviewed.  ?Constitutional:   ?   General: She is not in acute distress. ?   Appearance: She is well-developed. She is obese. She is not ill-appearing.  ?HENT:  ?   Head: Normocephalic and atraumatic.  ?Eyes:  ?   Pupils: Pupils are equal, round, and reactive to light.  ?Cardiovascular:  ?   Rate and Rhythm: Normal rate and regular rhythm.  ?Pulmonary:  ?   Effort: Pulmonary effort is normal.  ?Abdominal:  ?   Palpations: Abdomen is soft.  ?   Tenderness: There is no abdominal tenderness.  ?Musculoskeletal:     ?   General: Normal range of motion.  ?Skin: ?   General: Skin is warm and dry.  ?   Capillary Refill: Capillary refill takes less than 2 seconds.  ?Neurological:  ?   Mental Status: She is alert and oriented to person, place, and time.  ?Psychiatric:     ?    Mood and Affect: Mood normal.     ?   Behavior: Behavior normal.     ?   Thought Content: Thought content normal.     ?   Judgment: Judgment normal.  ? ?Results for orders placed or performed during the hospital encounter of 04/16/21 (from the past 24 hour(s))  ?Urinalysis, Routine w reflex microscopic Urine, Clean Catch     Status: Abnormal  ? Collection Time: 04/16/21  5:33 PM  ?Result Value Ref Range  ? Color, Urine YELLOW YELLOW  ? APPearance CLOUDY (A) CLEAR  ? Specific Gravity, Urine 1.019 1.005 - 1.030  ? pH 6.0 5.0 - 8.0  ? Glucose, UA 150 (A) NEGATIVE mg/dL  ? Hgb urine dipstick NEGATIVE NEGATIVE  ? Bilirubin Urine NEGATIVE NEGATIVE  ? Ketones, ur NEGATIVE NEGATIVE mg/dL  ? Protein, ur NEGATIVE NEGATIVE mg/dL  ? Nitrite POSITIVE (A) NEGATIVE  ? Leukocytes,Ua MODERATE (A) NEGATIVE  ? RBC / HPF 0-5 0 - 5 RBC/hpf  ? WBC, UA 21-50 0 - 5 WBC/hpf  ? Bacteria, UA MANY (A) NONE SEEN  ? Squamous Epithelial / LPF 11-20 0 - 5  ? Mucus PRESENT   ? Hyaline Casts, UA PRESENT   ? Non Squamous Epithelial 0-5 (A) NONE SEEN  ?Glucose, capillary  Status: None  ? Collection Time: 04/16/21  7:44 PM  ?Result Value Ref Range  ? Glucose-Capillary 91 70 - 99 mg/dL  ? ?MDM/MAU Course ?Discussion with pt revealed the only current symptom she has is the nausea from acid build up. Pepcid 40mg  orally given with good relief. RN educated about need to sit up for two hours after eating and avoid heavy meals. ? ?Reviewed normalcy of Braxton-Hicks contractions, especially while at work and walking around and especially in patients with extra weight and pre-existing pelvic pain. Advised her to wear her belly band whenever up walking and stretch daily. Strongly advised her to keep checking her glucose as directed, follow the diabetic diet closely and bring her glucose log to her appointments so we can prescribe meds as needed to keep her levels in good control. Also strongly encouraged pt to use her OB appointments for  questions/reassurance and to use MAU for emergent needs. ? ?A ?Braxton-Hicks contractions ?Reflux in pregnancy ?NST reactive ?[redacted] weeks gestation of pregnancy ? ?P ?Discharge from MAU in stable condition with return precautions ?Follow up at [facility] as scheduled for ongoing prenatal care ?Patient may return to MAU as needed for emergent OB/GYN related complaints ? ?Gaylan Gerold, CNM, MSN, IBCLC ?Certified Nurse Midwife, [place] ? ?  ? ?

## 2021-04-19 ENCOUNTER — Telehealth: Payer: Self-pay | Admitting: Family Medicine

## 2021-04-19 ENCOUNTER — Other Ambulatory Visit: Payer: Self-pay

## 2021-04-19 ENCOUNTER — Ambulatory Visit (INDEPENDENT_AMBULATORY_CARE_PROVIDER_SITE_OTHER): Payer: BC Managed Care – PPO | Admitting: Advanced Practice Midwife

## 2021-04-19 ENCOUNTER — Encounter: Payer: Self-pay | Admitting: Family Medicine

## 2021-04-19 VITALS — BP 113/69 | HR 108 | Wt 212.0 lb

## 2021-04-19 DIAGNOSIS — Z23 Encounter for immunization: Secondary | ICD-10-CM | POA: Diagnosis not present

## 2021-04-19 DIAGNOSIS — O0990 Supervision of high risk pregnancy, unspecified, unspecified trimester: Secondary | ICD-10-CM

## 2021-04-19 DIAGNOSIS — O479 False labor, unspecified: Secondary | ICD-10-CM

## 2021-04-19 DIAGNOSIS — K219 Gastro-esophageal reflux disease without esophagitis: Secondary | ICD-10-CM

## 2021-04-19 DIAGNOSIS — O24410 Gestational diabetes mellitus in pregnancy, diet controlled: Secondary | ICD-10-CM

## 2021-04-19 DIAGNOSIS — O99613 Diseases of the digestive system complicating pregnancy, third trimester: Secondary | ICD-10-CM

## 2021-04-19 DIAGNOSIS — Z789 Other specified health status: Secondary | ICD-10-CM

## 2021-04-19 DIAGNOSIS — O09299 Supervision of pregnancy with other poor reproductive or obstetric history, unspecified trimester: Secondary | ICD-10-CM

## 2021-04-19 MED ORDER — CALCIUM CARBONATE ANTACID 500 MG PO CHEW: 1.0000 | CHEWABLE_TABLET | Freq: Every day | ORAL | 0 refills | Status: AC

## 2021-04-19 MED ORDER — OMEPRAZOLE MAGNESIUM 20 MG PO TBEC
20.0000 mg | DELAYED_RELEASE_TABLET | Freq: Every day | ORAL | 0 refills | Status: DC
Start: 2021-04-19 — End: 2021-04-27

## 2021-04-19 NOTE — Progress Notes (Addendum)
? ?PRENATAL VISIT NOTE ? ?Subjective:  ?Kristy Nguyen is a 31 y.o. G3P1011 at [redacted]w[redacted]d being seen today for ongoing prenatal care.  She is currently monitored for the following issues for this high-risk pregnancy and has Urinary tract infection in mother during second trimester of pregnancy; GDM (gestational diabetes mellitus); History of group B Streptococcus (GBS) infection; History of oligohydramnios in prior pregnancy, currently pregnant; Supervision of high risk pregnancy, antepartum; History of postpartum depression, currently pregnant; BMI 40.0-44.9, adult ([place]); Language barrier; Obesity in pregnancy; LGA (large for gestational age) fetus affecting management of mother; History of shoulder dystocia in prior pregnancy, currently pregnant; and Chlamydia trachomatis infection in pregnancy on their problem list. ? ?Patient reports  periumbilical pain, recurrent .  Patient took 4 weeks off work due to previous occurrence of pain. Then pain stopped so she has just returned to work and pain has also returned. Pain initially occurred once each day but now occurs frequently throughout the day. Resolves with rest and hydration. Contractions: Irritability. Vag. Bleeding: None.  Movement: Present. Denies leaking of fluid.  ? ?24 hour diet recall includes meat, bananas, corn flakes and milk, stew meat with rice. Patient reports taking her CBGs as directed. She states all numbers are within 2-3 points of target reading. ? ?The following portions of the patient's history were reviewed and updated as appropriate: allergies, current medications, past family history, past medical history, past social history, past surgical history and problem list. Problem list updated. ? ?Objective:  ? ?Vitals:  ? 04/19/21 1342  ?BP: 113/69  ?Pulse: (!) 108  ?Weight: 212 lb (96.2 kg)  ? ? ?Fetal Status: Fetal Heart Rate (bpm): 150   Movement: Present    ? ?General:  Alert, oriented and cooperative. Patient is in no acute distress.   ?Skin: Skin is warm and dry. No rash noted.   ?Cardiovascular: Normal heart rate noted  ?Respiratory: Normal respiratory effort, no problems with respiration noted  ?Abdomen: Soft, gravid, appropriate for gestational age.  Pain/Pressure: Present     ?Pelvic: Cervical exam deferred      Declined by patient s/p exam in MAU 03/03  ?Extremities: Normal range of motion.  Edema: None  ?Mental Status: Normal mood and affect. Normal behavior. Normal judgment and thought content.  ? ?Assessment and Plan:  ?Pregnancy: G3P1011 at [redacted]w[redacted]d ? ?1. Supervision of high risk pregnancy, antepartum ?- No acute concerns today ?- CBC ?- RPR ?- HIV Antibody (routine testing w rflx) ?- Tdap vaccine greater than or equal to 7yo IM ? ?2. Braxton Hicks contractions ?- Lengthy discussion of [place] vs labor ?- Preterm labor precautions, indication for evaluation in MAU reviewed with patient ? ?3. Gastroesophageal reflux in pregnancy in third trimester ?- Diet revision as primary intervention ?- Prilosec daily, Tums she needs quick relief ? ?4. Diet controlled gestational diabetes mellitus (GDM) in second trimester ?- Log not brought today ?- Encouraged clean protein when able ? ?5. History of shoulder dystocia in prior pregnancy, currently pregnant ?- Patient states she took baby to physical therapy for 3 years due to birth injury ?- Reassured serial evaluation by MFM will track growth, make decision for delivery as needed once term ? ?6. Language Barrier ?- Spanish language interpreter present for all patient interaction ? ?Preterm labor symptoms and general obstetric precautions including but not limited to vaginal bleeding, contractions, leaking of fluid and fetal movement were reviewed in detail with the patient. ?Please refer to After Visit Summary for other counseling recommendations.  ?  Return in about 2 weeks (around 05/03/2021) for MD ONLY. ? ?Future Appointments  ?Date Time Provider [place]  ?04/22/2021  4:15 PM Junie Panning, PT  [facility] None  ?04/26/2021  4:15 PM Junie Panning, PT [facility] None  ?04/27/2021  3:30 PM [facility] NURSE [facility]  ?04/27/2021  3:45 PM [facility] US5 [facility]  ?05/03/2021  4:15 PM Junie Panning, PT [facility] None  ?05/04/2021  4:15 PM Renard Matter, MD [facility]  ?05/10/2021  4:15 PM Junie Panning, PT [facility] None  ? ? ?Darlina Rumpf, CNM ? ?

## 2021-04-19 NOTE — Patient Instructions (Signed)
Vacuna Tdap (ttanos, difteria y tos ferina): lo que debe saber Tdap (Tetanus, Diphtheria, Pertussis) Vaccine: What You Need to Know 1. Por qu vacunarse? La vacuna Tdap puede prevenir el ttanos, la difteria y la tos ferina. La difteria y la tos ferina se contagian de persona a persona. El ttanos ingresa al organismo a travs de cortes o heridas. El TTANOS (T) provoca rigidez dolorosa en los msculos. El ttanos puede causar graves problemas de salud, como no poder abrir la boca, tener dificultad para tragar y respirar, o la muerte. La DIFTERIA (D) puede causar dificultad para respirar, insuficiencia cardaca, parlisis o muerte. La TOS FERINA (aP), tambin conocida como "tos convulsa", puede causar tos violenta e incontrolable lo que hace difcil respirar, comer o beber. La tos ferina puede ser muy grave, especialmente en los bebs y en los nios pequeos, y causar neumona, convulsiones, dao cerebral o la muerte. En adolescentes y adultos, puede causar prdida de peso, prdida del control de la vejiga, desmayos y fracturas de costillas al toser de manera intensa. 2. Vacuna Tdap La vacuna Tdap es solo para nios de 7 aos en adelante, adolescentes y adultos.  Los adolescentes deben recibir una dosis nica de la vacuna Tdap, preferentemente a los 11 o 12 aos. Las personas embarazadas deben recibir una dosis de la vacuna Tdap durante cada embarazo, preferiblemente durante la primera parte del tercer trimestre, para ayudar a proteger al recin nacido de la tos ferina. Los bebs tienen mayor riesgo de sufrir complicaciones graves y potencialmente mortales debido a la tos ferina. Los adultos que nunca recibieron la vacuna Tdap deben recibir una dosis. Adems, los adultos deben recibir una dosis de refuerzo de Tdap o Td (una vacuna diferente que protege contra el ttanos y la difteria, pero no contra la tos ferina) cada 10 aos, o despus de 5 aos en caso de herida o quemadura grave o sucia. La  vacuna Tdap puede ser administrada al mismo tiempo que otras vacunas. 3. Hable con el mdico Comunquese con la persona que le coloca las vacunas si la persona que la recibe: Ha tenido una reaccin alrgica despus de una dosis anterior de cualquier vacuna contra el ttanos, la difteria o la tos ferina, o cualquier alergia grave, potencialmente mortal Ha tenido un coma, disminucin del nivel de la conciencia o convulsiones prolongadas dentro de los 7 das posteriores a una dosis anterior de cualquier vacuna contra la tos ferina (DTP, DTaP o Tdap) Tiene convulsiones u otro problema del sistema nervioso Alguna vez tuvo sndrome de Guillain-Barr (tambin llamado "SGB") Ha tenido dolor intenso o hinchazn despus de una dosis anterior de cualquier vacuna contra el ttanos o la difteria En algunos casos, es posible que el mdico decida posponer la aplicacin de la vacuna Tdap para una visita en el futuro. Las personas que sufren trastornos menores, como un resfro, pueden vacunarse. Las personas que tienen enfermedades moderadas o graves generalmente deben esperar hasta recuperarse para poder recibir la vacuna Tdap.  Su mdico puede darle ms informacin. 4. Riesgos de una reaccin a la vacuna Despus de recibir la vacuna Tdap a veces se puede tener dolor, enrojecimiento o hinchazn en el lugar donde se aplic la inyeccin, fiebre leve, dolor de cabeza, sensacin de cansancio y nuseas, vmitos, diarrea o dolor de estmago. Las personas a veces se desmayan despus de procedimientos mdicos, incluida la vacunacin. Informe al mdico si se siente mareado, tiene cambios en la visin o zumbidos en los odos.  Al igual que con cualquier medicamento,   existe una probabilidad muy remota de que una vacuna cause una reaccin alrgica grave, otra lesin grave o la muerte. 5. Qu pasa si se presenta un problema grave? Podra producirse una reaccin alrgica despus de que la persona vacunada abandone la clnica. Si  observa signos de una reaccin alrgica grave (ronchas, hinchazn de la cara y la garganta, dificultad para respirar, latidos cardacos acelerados, mareos o debilidad), llame al 9-1-1 y lleve a la persona al hospital ms cercano. Si se presentan otros signos que le preocupan, comunquese con su mdico.  Las reacciones adversas deben informarse al Sistema de Informe de Eventos Adversos de Vacunas (Vaccine Adverse Event Reporting System, VAERS). Por lo general, el mdico presenta este informe o puede hacerlo usted mismo. Visite el sitio web del VAERS en www.vaers.hhs.gov o llame al 1-800-822-7967. El VAERS es solo para informar reacciones, y los miembros de su personal no proporcionan asesoramiento mdico. 6. Programa Nacional de Compensacin de Daos por Vacunas El Programa Nacional de Compensacin de Daos por Vacunas (National Vaccine Injury Compensation Program, VICP) es un programa federal que fue creado para compensar a las personas que puedan haber sufrido daos al recibir ciertas vacunas. Las reclamaciones relativas a presuntas lesiones o muerte debidas a la vacunacin tienen un lmite de tiempo para su presentacin, que puede ser de tan solo dos aos. Visite el sitio web del VICP en www.hrsa.gov/vaccinecompensation o llame al 1-800-338-2382 para obtener ms informacin acerca del programa y de cmo presentar un reclamo. 7. Cmo puedo obtener ms informacin? Pregntele a su mdico. Comunquese con el servicio de salud de su localidad o su estado. Visite el sitio web de la Food and Drug Administration, (FDA) (Administracin de Alimentos y Medicamentos) para ver los prospectos de las vacunas e informacin adicional en www.fda.gov/vaccines-blood-biologics/vaccines. Comunquese con los Centers for Disease Control and Prevention, CDC (Centros para el Control y la Prevencin de Enfermedades): Llame al 1-800-232-4636 (1-800-CDC-INFO) o Visite el sitio web de los CDC en www.cdc.gov/vaccines. Declaracin  de informacin de la vacuna Tdap (ttanos, difteria y tos ferina) (09/20/2019) Esta informacin no tiene como fin reemplazar el consejo del mdico. Asegrese de hacerle al mdico cualquier pregunta que tenga. Document Revised: 11/19/2019 Document Reviewed: 11/04/2019 Elsevier Patient Education  2022 Elsevier Inc.  

## 2021-04-20 ENCOUNTER — Other Ambulatory Visit: Payer: Self-pay

## 2021-04-20 ENCOUNTER — Inpatient Hospital Stay (HOSPITAL_COMMUNITY)
Admission: AD | Admit: 2021-04-20 | Discharge: 2021-04-21 | Disposition: A | Discharge status: Home / Self Care | Payer: BC Managed Care – PPO | Attending: Obstetrics & Gynecology | Admitting: Obstetrics & Gynecology

## 2021-04-20 ENCOUNTER — Encounter (HOSPITAL_COMMUNITY): Payer: Self-pay | Admitting: Obstetrics & Gynecology

## 2021-04-20 DIAGNOSIS — F17210 Nicotine dependence, cigarettes, uncomplicated: Secondary | ICD-10-CM | POA: Insufficient documentation

## 2021-04-20 DIAGNOSIS — B9689 Other specified bacterial agents as the cause of diseases classified elsewhere: Secondary | ICD-10-CM | POA: Insufficient documentation

## 2021-04-20 DIAGNOSIS — O26852 Spotting complicating pregnancy, second trimester: Secondary | ICD-10-CM | POA: Diagnosis not present

## 2021-04-20 DIAGNOSIS — O23592 Infection of other part of genital tract in pregnancy, second trimester: Secondary | ICD-10-CM | POA: Diagnosis not present

## 2021-04-20 DIAGNOSIS — Z3A26 26 weeks gestation of pregnancy: Secondary | ICD-10-CM | POA: Diagnosis not present

## 2021-04-20 DIAGNOSIS — Z7982 Long term (current) use of aspirin: Secondary | ICD-10-CM | POA: Diagnosis not present

## 2021-04-20 DIAGNOSIS — Z880 Allergy status to penicillin: Secondary | ICD-10-CM | POA: Diagnosis not present

## 2021-04-20 DIAGNOSIS — O0990 Supervision of high risk pregnancy, unspecified, unspecified trimester: Secondary | ICD-10-CM

## 2021-04-20 DIAGNOSIS — O99332 Smoking (tobacco) complicating pregnancy, second trimester: Secondary | ICD-10-CM | POA: Diagnosis not present

## 2021-04-20 LAB — CBC
Hematocrit: 32.1 % — ABNORMAL LOW (ref 34.0–46.6)
Hemoglobin: 10.9 g/dL — ABNORMAL LOW (ref 11.1–15.9)
MCH: 29.9 pg (ref 26.6–33.0)
MCHC: 34 g/dL (ref 31.5–35.7)
MCV: 88 fL (ref 79–97)
Platelets: 320 10*3/uL (ref 150–450)
RBC: 3.65 x10E6/uL — ABNORMAL LOW (ref 3.77–5.28)
RDW: 12.2 % (ref 11.7–15.4)
WBC: 10.1 10*3/uL (ref 3.4–10.8)

## 2021-04-20 LAB — URINALYSIS, ROUTINE W REFLEX MICROSCOPIC
Bilirubin Urine: NEGATIVE
Glucose, UA: NEGATIVE mg/dL
Hgb urine dipstick: NEGATIVE
Ketones, ur: 5 mg/dL — AB
Nitrite: NEGATIVE
Protein, ur: 30 mg/dL — AB
Specific Gravity, Urine: 1.03 (ref 1.005–1.030)
pH: 6 (ref 5.0–8.0)

## 2021-04-20 LAB — RPR: RPR Ser Ql: NONREACTIVE

## 2021-04-20 LAB — HIV ANTIBODY (ROUTINE TESTING W REFLEX): HIV Screen 4th Generation wRfx: NONREACTIVE

## 2021-04-20 NOTE — MAU Note (Signed)
PT SAYS IN ENGLISH - TODAY AT 0930- WHILE AT WORK - SAW PINK BLOOD IN UNDERWEAR AND UC ?CALLED CLINIC- NOON- TOLD TO COME HERE ?STILL FEELS UC ?NO MORE BLOOD  ?LAST SEX- 03-23-2021 ?HAS HX - PELVIC PAIN ?GOES TO THERAPY WEEKLY- FOR LIGAMENT PAIN  ? ?

## 2021-04-20 NOTE — MAU Provider Note (Cosign Needed)
History    [id number]  Arrival date and time: 04/20/21 2153   Chief Complaint  Patient presents with   Vaginal Bleeding   HPI Kristy Nguyen is a 31 y.o. at [redacted]w[redacted]d who presents to MAU due to contractions earlier today and one episode of pink spotting.   Patient states that she noticed a small amount of pink blood on the toilet paper when wiping earlier today. She was feeling some contractions at that time as well. She denies any additional bleeding and she denies contractions or abdominal pain at this time. No recent intercourse. She denies loss of fluid and reports normal fetal movement. She denies any dysuria, nausea, or vomiting. She denies any abnormal vaginal discharge. She has come here to make sure that nothing is wrong with the baby due to the episode of spotting that she had earlier today.   O/Positive/-- (12/15 1326)  OB History     Gravida  3   Para  1   Term  1   Preterm  0   AB  1   Living  1      SAB  1   IAB  0   Ectopic  0   Multiple      Live Births  1           Past Medical History:  Diagnosis Date   Allergy    Diabetes mellitus without complication (HCC)    gestational   Gestational diabetes    Nexplanon insertion 10/28/2016   NSVD (normal spontaneous vaginal delivery) 09/17/2016   Supervision of high-risk pregnancy 04/05/2016    Clinic [facility] Prenatal Labs  Dating lmp and 12.4 week [place] Blood type: --/--/O POS (02/10 1138)   Genetic Screen 1 Screen:    AFP:     Quad:     NIPS: Antibody: negative  Anatomic [place]  Normal, anterior placenta, female  Rubella:  immune  GTT Early:               Third trimester:  Glucose, Fasting 65 - 91 mg/dL 95    Glucose, 1 hour 65 - 179 mg/dL 258    Glucose, 2 hour 65 - 152 mg/dL 948     RPR:   nega    Past Surgical History:  Procedure Laterality Date   DILATION AND EVACUATION  2010    Family History  Problem Relation Age of Onset   Hypertension Mother    Diabetes Father    Heart Problems Father     Anxiety disorder Sister    Depression Sister    Cancer Paternal Uncle        Brain   Diabetes Maternal Grandmother    Diabetes Maternal Grandfather    Diabetes Paternal Grandmother    Diabetes Paternal Grandfather     Social History   Socioeconomic History   Marital status: Single    Spouse name: Not on file   Number of children: Not on file   Years of education: Not on file   Highest education level: 9th grade  Occupational History   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   Social Determinants of Health   Financial Resource Strain: Not on file  Food Insecurity: Food Insecurity Present   Worried About [place] in the Last Year: Sometimes true   Ran Out of Food in the Last Year: Sometimes true  Transportation Needs: No Transportation Needs   Lack of Transportation (Medical): No   Lack of Transportation (Non-Medical): No  Physical Activity: Not on file  Stress: Not on file  Social Connections: Not on file  Intimate Partner Violence: Not on file    Allergies  Allergen Reactions   Fish Allergy Itching and Swelling   Latex Rash   Penicillins Itching, Swelling and Rash    Has patient had a PCN reaction causing immediate rash, facial/tongue/throat swelling, SOB or lightheadedness with hypotension: No Has patient had a PCN reaction causing severe rash involving mucus membranes or skin necrosis: No Has patient had a PCN reaction that required hospitalization No Has patient had a PCN reaction  occurring within the last 10 years: No If all of the above answers are "NO", then may proceed with Cephalosporin use.    No current facility-administered medications on file prior to encounter.   Current Outpatient Medications on File Prior to Encounter  Medication Sig Dispense Refill   aspirin EC 81 MG tablet Take 1 tablet (81 mg total) by mouth daily. 60 tablet 1   calcium carbonate (TUMS) 500 MG chewable tablet Chew 1 tablet (200 mg of elemental calcium total) by mouth daily. 30 tablet 0   cyclobenzaprine (FLEXERIL) 5 MG tablet Take 1 tablet (5 mg total) by mouth 3 (three) times daily as needed for muscle spasms. 15 tablet 0   doxylamine, Sleep, (UNISOM) 25 MG tablet Take 25 mg by mouth at bedtime as needed.     famotidine (PEPCID) 20 MG tablet Take 1 tablet (20 mg total) by mouth at bedtime. 30 tablet 3   omeprazole (PRILOSEC OTC) 20 MG tablet Take 1 tablet (20 mg total) by mouth daily. 30 tablet 0   Prenatal Vit-Fe Fumarate-FA (PRENATAL PO) Take by mouth.     pyridoxine (B-6) 100 MG tablet Take 100 mg by mouth daily.     Accu-Chek Softclix Lancets lancets Use as instructed 100 each 12   Blood Glucose Monitoring Suppl (ACCU-CHEK GUIDE) w/Device KIT 1 Device by Does not apply route in the morning, at noon, in the evening, and at bedtime. 1 kit 0   glucose blood test strip Use as instructed 100 each 12   ROS Pertinent positives and negative per HPI, all others reviewed and negative.  Physical Exam   BP (!) 112/58 (BP Location: Right Arm)    Pulse 99    Temp 98.2 F (36.8 C) (Oral)    Resp 18    Ht $R'4\' 8"'AW$  (1.422 m)    Wt 97.7 kg    LMP 10/16/2020    BMI 48.27 kg/m   Physical Exam Constitutional:      General: She is not in acute distress.    Appearance: Normal appearance.  HENT:     Head: Normocephalic and atraumatic.     Mouth/Throat:     Mouth: Mucous membranes are moist.  Eyes:     Extraocular Movements: Extraocular movements intact.     Conjunctiva/sclera: Conjunctivae  normal.  Cardiovascular:     Rate and Rhythm: Normal rate.  Pulmonary:     Effort: Pulmonary effort is normal.  Abdominal:     Palpations: Abdomen is soft.     Tenderness: There is no abdominal tenderness. There is no guarding or rebound.  Musculoskeletal:     Right lower leg: No edema.     Left lower leg: No edema.  Skin:    General: Skin is warm and dry.  Neurological:     General: No focal deficit present.     Mental Status: She is alert and oriented to person, place, and time.  Psychiatric:        Mood and Affect: Mood normal.        Behavior: Behavior normal.   FHT Baseline 150 bpm, moderate variability, 15 x 15 accels, no decels Toco: None Cat: 1, reactive   Labs Results for orders placed or performed during the hospital encounter of 04/20/21 (from the past 24 hour(s))  Urinalysis, Routine w reflex microscopic Urine, Clean Catch     Status: Abnormal   Collection Time: 04/20/21 10:33 PM  Result Value Ref Range   Color, Urine YELLOW YELLOW   APPearance HAZY (A) CLEAR   Specific Gravity, Urine 1.030 1.005 - 1.030   pH 6.0 5.0 - 8.0   Glucose, UA NEGATIVE NEGATIVE mg/dL   Hgb urine dipstick NEGATIVE NEGATIVE   Bilirubin Urine NEGATIVE NEGATIVE   Ketones, ur 5 (A) NEGATIVE mg/dL   Protein, ur 30 (A) NEGATIVE mg/dL   Nitrite NEGATIVE NEGATIVE   Leukocytes,Ua TRACE (A) NEGATIVE   RBC / HPF 0-5 0 - 5 RBC/hpf   WBC, UA 6-10 0 - 5 WBC/hpf   Bacteria, UA RARE (A) NONE SEEN   Squamous Epithelial / LPF 6-10 0 - 5   Mucus PRESENT     Imaging No results found.  MAU Course  Procedures Lab Orders         OB Urine Culture         Wet prep, genital         Urinalysis, Routine w reflex microscopic Urine, Clean Catch    No orders of the defined types were placed in this encounter.  Imaging Orders  No imaging studies ordered today   MDM Patient presents to MAU after one episode of spotting and contractions earlier today No bleeding in MAU, no contractions or abdominal  pain at this time Cat 1, reactive NST Abdominal exam benign, no contractions on tocometer UA largely unremarkable (trace leukocytes and rare bacteria), sent for culture due to recent UTI that has since been treated Wet prep with clue cells, otherwise normal; GC/CT collected and pending  Assessment and Plan   1. Spotting affecting pregnancy in second trimester - Resolved - No associated abdominal pain or contractions at this time; Cat 1/reactive NST with no contractions on monitor  - Benign abdomen  - Wet prep positive for BV, could be contributing to cervical irritation and brief episode of spotting earlier today  - GC/CT collected and pending; urine culture ordered - will follow up results  - Will treat BV with 7 day course of PO Flagyl - Return precautions reviewed should her bleeding worsen or if she develops worsening pain - Patient voiced understanding and all questions/concerns addressed  Genia Del, MD

## 2021-04-21 DIAGNOSIS — O26852 Spotting complicating pregnancy, second trimester: Secondary | ICD-10-CM | POA: Diagnosis not present

## 2021-04-21 LAB — GC/CHLAMYDIA PROBE AMP (~~LOC~~) NOT AT ARMC
Chlamydia: POSITIVE — AB
Comment: NEGATIVE
Comment: NORMAL
Neisseria Gonorrhea: NEGATIVE

## 2021-04-21 LAB — WET PREP, GENITAL
Sperm: NONE SEEN
Trich, Wet Prep: NONE SEEN
WBC, Wet Prep HPF POC: >=10 — AB (ref <10)
Yeast Wet Prep HPF POC: NONE SEEN

## 2021-04-21 MED ORDER — METRONIDAZOLE 500 MG PO TABS: 500.0000 mg | ORAL_TABLET | Freq: Two times a day (BID) | ORAL | 0 refills | Status: DC

## 2021-04-22 ENCOUNTER — Encounter: Payer: Self-pay | Admitting: Physical Therapy

## 2021-04-22 ENCOUNTER — Other Ambulatory Visit: Payer: Self-pay

## 2021-04-22 ENCOUNTER — Emergency Department (HOSPITAL_BASED_OUTPATIENT_CLINIC_OR_DEPARTMENT_OTHER)
Admission: EM | Admit: 2021-04-22 | Discharge: 2021-04-23 | Disposition: A | Discharge status: Home / Self Care | Payer: BC Managed Care – PPO | Attending: Emergency Medicine | Admitting: Emergency Medicine

## 2021-04-22 ENCOUNTER — Encounter: Payer: Self-pay | Admitting: Radiology

## 2021-04-22 ENCOUNTER — Encounter (HOSPITAL_BASED_OUTPATIENT_CLINIC_OR_DEPARTMENT_OTHER): Payer: Self-pay

## 2021-04-22 DIAGNOSIS — Z91040 Latex allergy status: Secondary | ICD-10-CM | POA: Insufficient documentation

## 2021-04-22 DIAGNOSIS — Z3A19 19 weeks gestation of pregnancy: Secondary | ICD-10-CM | POA: Diagnosis not present

## 2021-04-22 DIAGNOSIS — Z9689 Presence of other specified functional implants: Secondary | ICD-10-CM | POA: Diagnosis not present

## 2021-04-22 DIAGNOSIS — Z3A26 26 weeks gestation of pregnancy: Secondary | ICD-10-CM | POA: Diagnosis not present

## 2021-04-22 DIAGNOSIS — O212 Late vomiting of pregnancy: Secondary | ICD-10-CM | POA: Diagnosis not present

## 2021-04-22 DIAGNOSIS — Z3A27 27 weeks gestation of pregnancy: Secondary | ICD-10-CM | POA: Diagnosis not present

## 2021-04-22 DIAGNOSIS — O26899 Other specified pregnancy related conditions, unspecified trimester: Secondary | ICD-10-CM

## 2021-04-22 DIAGNOSIS — O26892 Other specified pregnancy related conditions, second trimester: Secondary | ICD-10-CM | POA: Diagnosis not present

## 2021-04-22 DIAGNOSIS — A568 Sexually transmitted chlamydial infection of other sites: Secondary | ICD-10-CM | POA: Diagnosis not present

## 2021-04-22 DIAGNOSIS — O219 Vomiting of pregnancy, unspecified: Secondary | ICD-10-CM | POA: Diagnosis not present

## 2021-04-22 DIAGNOSIS — R799 Abnormal finding of blood chemistry, unspecified: Secondary | ICD-10-CM | POA: Diagnosis not present

## 2021-04-22 DIAGNOSIS — N939 Abnormal uterine and vaginal bleeding, unspecified: Secondary | ICD-10-CM | POA: Insufficient documentation

## 2021-04-22 DIAGNOSIS — Z7982 Long term (current) use of aspirin: Secondary | ICD-10-CM | POA: Insufficient documentation

## 2021-04-22 DIAGNOSIS — O98312 Other infections with a predominantly sexual mode of transmission complicating pregnancy, second trimester: Secondary | ICD-10-CM | POA: Diagnosis not present

## 2021-04-22 DIAGNOSIS — R519 Headache, unspecified: Secondary | ICD-10-CM | POA: Insufficient documentation

## 2021-04-22 DIAGNOSIS — R1013 Epigastric pain: Secondary | ICD-10-CM | POA: Diagnosis not present

## 2021-04-22 DIAGNOSIS — O2342 Unspecified infection of urinary tract in pregnancy, second trimester: Secondary | ICD-10-CM | POA: Diagnosis not present

## 2021-04-22 DIAGNOSIS — N390 Urinary tract infection, site not specified: Secondary | ICD-10-CM | POA: Insufficient documentation

## 2021-04-22 DIAGNOSIS — R1030 Lower abdominal pain, unspecified: Secondary | ICD-10-CM | POA: Insufficient documentation

## 2021-04-22 DIAGNOSIS — R1033 Periumbilical pain: Secondary | ICD-10-CM | POA: Insufficient documentation

## 2021-04-22 DIAGNOSIS — R109 Unspecified abdominal pain: Secondary | ICD-10-CM | POA: Insufficient documentation

## 2021-04-22 LAB — CBG MONITORING, ED: Glucose-Capillary: 112 mg/dL — ABNORMAL HIGH (ref 70–99)

## 2021-04-22 NOTE — ED Triage Notes (Signed)
Patient states she is [redacted] weeks pregnant.  Complains of vaginal bleeding "a little bit" which began yesterday.  Patient also complains of abdominal cramping/possible contractions.  Fetal heart tones 150 in triage.  Patient states she was diagnosed with gestational diabetes but has not measured blood sugar today.  ?

## 2021-04-22 NOTE — ED Provider Notes (Signed)
? ?[facility] EMERGENCY DEPARTMENT  ?Provider Note ? ?CSN: [id number] ?Arrival date & time: 04/22/21 2302 ? ?History ?Chief Complaint  ?Patient presents with  ? Vaginal Bleeding  ? Contractions  ? ? ?Kristy Nguyen is a 31 y.o. female who is G3P1A1 at about [redacted] weeks gestation reports she has been having some cramping abdominal pain, like contractions and she noted some spotting when she wiped. She was seen for similar symptoms at MAU 2 days ago, had a reassuring  NST. Wet prep was positive for BV treated with Flagyl. She also had a positive chlamydia test but had recently been treated for same and so re-treatment was deferred. She has gestational diabetes. She denies large amount of bleeding, clots or gush of fluid.  ? ? ?Home Medications ?Prior to Admission medications   ?Medication Sig Start Date End Date Taking? Authorizing Provider  ?Accu-Chek Softclix Lancets lancets Use as instructed 03/01/21   Aletha Halim, MD  ?aspirin EC 81 MG tablet Take 1 tablet (81 mg total) by mouth daily. 03/01/21   Aletha Halim, MD  ?Blood Glucose Monitoring Suppl (ACCU-CHEK GUIDE) w/Device KIT 1 Device by Does not apply route in the morning, at noon, in the evening, and at bedtime. 03/01/21   Aletha Halim, MD  ?calcium carbonate (TUMS) 500 MG chewable tablet Chew 1 tablet (200 mg of elemental calcium total) by mouth daily. 04/19/21 05/19/21  Darlina Rumpf, CNM  ?cyclobenzaprine (FLEXERIL) 5 MG tablet Take 1 tablet (5 mg total) by mouth 3 (three) times daily as needed for muscle spasms. 03/10/21   Renard Matter, MD  ?doxylamine, Sleep, (UNISOM) 25 MG tablet Take 25 mg by mouth at bedtime as needed.    [provider]  ?famotidine (PEPCID) 20 MG tablet Take 1 tablet (20 mg total) by mouth at bedtime. 04/16/21   Gabriel Carina, CNM  ?glucose blood test strip Use as instructed 03/01/21   Aletha Halim, MD  ?metroNIDAZOLE (FLAGYL) 500 MG tablet Take 1 tablet (500 mg total) by mouth 2 (two) times daily  for 7 days. 04/21/21 04/28/21  Genia Del, MD  ?omeprazole (PRILOSEC OTC) 20 MG tablet Take 1 tablet (20 mg total) by mouth daily. 04/19/21 05/19/21  Darlina Rumpf, CNM  ?Prenatal Vit-Fe Fumarate-FA (PRENATAL PO) Take by mouth.    [provider]  ?pyridoxine (B-6) 100 MG tablet Take 100 mg by mouth daily.    [provider]  ? ? ? ?Allergies    ?Fish allergy, Latex, and Penicillins ? ? ?Review of Systems   ?Review of Systems ?Please see HPI for pertinent positives and negatives ? ?Physical Exam ?BP 117/76 (BP Location: Left Arm)   Pulse (!) 112   Temp 98 ?F (36.7 ?C) (Oral)   Resp 18   Ht $R'4\' 8"'fB$  (1.422 m)   Wt 96.2 kg   LMP 10/16/2020   SpO2 99%   BMI 47.53 kg/m?  ? ?Physical Exam ?Vitals and nursing note reviewed.  ?Constitutional:   ?   Appearance: Normal appearance.  ?HENT:  ?   Head: Normocephalic and atraumatic.  ?   Nose: Nose normal.  ?   Mouth/Throat:  ?   Mouth: Mucous membranes are moist.  ?Eyes:  ?   Extraocular Movements: Extraocular movements intact.  ?   Conjunctiva/sclera: Conjunctivae normal.  ?Cardiovascular:  ?   Rate and Rhythm: Normal rate.  ?Pulmonary:  ?   Effort: Pulmonary effort is normal.  ?   Breath sounds: Normal breath sounds.  ?  Abdominal:  ?   General: Abdomen is flat.  ?   Palpations: Abdomen is soft.  ?   Tenderness: There is no abdominal tenderness.  ?   Comments: gravid  ?Musculoskeletal:     ?   General: No swelling. Normal range of motion.  ?   Cervical back: Neck supple.  ?Skin: ?   General: Skin is warm and dry.  ?Neurological:  ?   General: No focal deficit present.  ?   Mental Status: She is alert.  ?Psychiatric:     ?   Mood and Affect: Mood normal.  ? ? ?ED Results / Procedures / Treatments   ?EKG ?None ? ?Procedures ?Procedures ? ?Medications Ordered in the ED ?Medications - No data to display ? ?Initial Impression and Plan ? Patient with cramping abdominal pain in pregnancy with some pink vaginal spotting, seen for similar at MAU 2 days  ago. Will place on fetal monitor and discuss with Ob.  ? ?ED Course  ? ?Clinical Course as of 04/23/21 0001  ?Thu Apr 22, 2021  ?2359 Per Rapid Ob, patient has not had any contractions and FHT are reassuring. She has requested we do a cervix exam. With Chaperone present, using sterile gloves, cervix is high, closed. Cervix is posterior. No blood on glove. Otherwise patient is cleared for discharge. Advised to follow up with her Ob and to go to the MAU for any further pregnancy related concerns.  [CS]  ?  ?Clinical Course User Index ?[CS] Truddie Hidden, MD  ? ? ? ?MDM Rules/Calculators/A&P ?Medical Decision Making ?Given presenting complaint, I considered that admission might be necessary. After review of results from ED evaluation, admission to the hospital is not indicated at this time.  ? ? ?Problems Addressed: ?Abdominal pain in pregnancy, antepartum: acute illness or injury ? ?Amount and/or Complexity of Data Reviewed ?Labs: ordered. Decision-making details documented in ED Course. ? ?Risk ?Decision regarding hospitalization. ? ? ? ?Final Clinical Impression(s) / ED Diagnoses ?Final diagnoses:  ?Abdominal pain in pregnancy, antepartum  ? ? ?Rx / DC Orders ?ED Discharge Orders   ? ? None  ? ?  ? ?  ?Truddie Hidden, MD ?04/23/21 0003 ? ?

## 2021-04-22 NOTE — ED Notes (Signed)
OB rapid response nurse contacted by Alyson Reedy, RN.  Patient placed on maternal fetal monitoring.  Patient states she can feel the baby moving.  Fetal heart tones remain 150. ?

## 2021-04-23 ENCOUNTER — Encounter (HOSPITAL_COMMUNITY): Payer: Self-pay | Admitting: Obstetrics and Gynecology

## 2021-04-23 ENCOUNTER — Inpatient Hospital Stay (EMERGENCY_DEPARTMENT_HOSPITAL)
Admission: AD | Admit: 2021-04-23 | Discharge: 2021-04-23 | Disposition: A | Discharge status: Home / Self Care | Payer: BC Managed Care – PPO | Source: Home / Self Care | Attending: Obstetrics and Gynecology | Admitting: Obstetrics and Gynecology

## 2021-04-23 DIAGNOSIS — O212 Late vomiting of pregnancy: Secondary | ICD-10-CM | POA: Insufficient documentation

## 2021-04-23 DIAGNOSIS — R1030 Lower abdominal pain, unspecified: Secondary | ICD-10-CM | POA: Insufficient documentation

## 2021-04-23 DIAGNOSIS — N390 Urinary tract infection, site not specified: Secondary | ICD-10-CM | POA: Insufficient documentation

## 2021-04-23 DIAGNOSIS — O219 Vomiting of pregnancy, unspecified: Secondary | ICD-10-CM | POA: Diagnosis not present

## 2021-04-23 DIAGNOSIS — A568 Sexually transmitted chlamydial infection of other sites: Secondary | ICD-10-CM | POA: Insufficient documentation

## 2021-04-23 DIAGNOSIS — Z3689 Encounter for other specified antenatal screening: Secondary | ICD-10-CM | POA: Insufficient documentation

## 2021-04-23 DIAGNOSIS — O2342 Unspecified infection of urinary tract in pregnancy, second trimester: Secondary | ICD-10-CM | POA: Insufficient documentation

## 2021-04-23 DIAGNOSIS — R1033 Periumbilical pain: Secondary | ICD-10-CM | POA: Insufficient documentation

## 2021-04-23 DIAGNOSIS — Z3A27 27 weeks gestation of pregnancy: Secondary | ICD-10-CM | POA: Insufficient documentation

## 2021-04-23 DIAGNOSIS — O26892 Other specified pregnancy related conditions, second trimester: Secondary | ICD-10-CM | POA: Insufficient documentation

## 2021-04-23 DIAGNOSIS — R519 Headache, unspecified: Secondary | ICD-10-CM | POA: Insufficient documentation

## 2021-04-23 DIAGNOSIS — O98312 Other infections with a predominantly sexual mode of transmission complicating pregnancy, second trimester: Secondary | ICD-10-CM | POA: Insufficient documentation

## 2021-04-23 LAB — URINALYSIS, ROUTINE W REFLEX MICROSCOPIC
Bilirubin Urine: NEGATIVE
Glucose, UA: NEGATIVE mg/dL
Hgb urine dipstick: NEGATIVE
Ketones, ur: 5 mg/dL — AB
Leukocytes,Ua: NEGATIVE
Nitrite: POSITIVE — AB
Protein, ur: NEGATIVE mg/dL
Specific Gravity, Urine: 1.02 (ref 1.005–1.030)
pH: 6 (ref 5.0–8.0)

## 2021-04-23 LAB — CULTURE, OB URINE

## 2021-04-23 MED ORDER — LACTATED RINGERS IV BOLUS
1000.0000 mL | Freq: Once | INTRAVENOUS | Status: AC
  Administered 2021-04-23: 1000 mL via INTRAVENOUS

## 2021-04-23 MED ORDER — ONDANSETRON HCL 4 MG/2ML IJ SOLN
4.0000 mg | Freq: Once | INTRAMUSCULAR | Status: AC
  Administered 2021-04-23: 4 mg via INTRAVENOUS
  Filled 2021-04-23: qty 2

## 2021-04-23 MED ORDER — FAMOTIDINE IN NACL 20-0.9 MG/50ML-% IV SOLN
20.0000 mg | Freq: Once | INTRAVENOUS | Status: AC
  Administered 2021-04-23: 20 mg via INTRAVENOUS
  Filled 2021-04-23: qty 50

## 2021-04-23 MED ORDER — CEFADROXIL 500 MG PO CAPS: 500.0000 mg | ORAL_CAPSULE | Freq: Two times a day (BID) | ORAL | 0 refills | Status: DC

## 2021-04-23 NOTE — MAU Note (Signed)
.  Kristy Nguyen is a 31 y.o. at [redacted]w[redacted]d here in MAU reporting: that she woke up feeling nauseated this morning vomited several times  and having cramping/ctx.  ?Onset of complaint: 5am ?Pain score: 7/10 ?Vitals:  ? 04/23/21 1449  ?BP: 116/66  ?Pulse: (!) 102  ?Resp: 18  ?Temp: 97.9 ?F (36.6 ?C)  ?SpO2: 99%  ?   ?FHT:152 ?Lab orders placed from triage:  u/a ? ?

## 2021-04-23 NOTE — ED Provider Notes (Signed)
? ?[facility] EMERGENCY DEPARTMENT  ?Provider Note ? ?CSN: [id number] ?Arrival date & time: 04/22/21 2302 ? ?History ?Chief Complaint  ?Patient presents with  ? Vaginal Bleeding  ? Contractions  ? ? ?Kristy Nguyen is a 31 y.o. female Kristy Nguyen is G3P1A1 at about [redacted] weeks gestation reports she has been having some cramping abdominal pain, like contractions and she noted some spotting when she wiped. She was seen for similar symptoms at MAU 2 days ago, had a reassuring  NST. Wet prep was positive for BV treated with Flagyl. She also had a positive chlamydia test but had recently been treated for same and so re-treatment was deferred. She has gestational diabetes. She denies large amount of bleeding, clots or gush of fluid.  ?  ? ? ?Home Medications ?Prior to Admission medications   ?Medication Sig Start Date End Date Taking? Authorizing Provider  ?Accu-Chek Softclix Lancets lancets Use as instructed 03/01/21   Aletha Halim, MD  ?aspirin EC 81 MG tablet Take 1 tablet (81 mg total) by mouth daily. 03/01/21   Aletha Halim, MD  ?Blood Glucose Monitoring Suppl (ACCU-CHEK GUIDE) w/Device KIT 1 Device by Does not apply route in the morning, at noon, in the evening, and at bedtime. 03/01/21   Aletha Halim, MD  ?calcium carbonate (TUMS) 500 MG chewable tablet Chew 1 tablet (200 mg of elemental calcium total) by mouth daily. 04/19/21 05/19/21  Darlina Rumpf, CNM  ?cyclobenzaprine (FLEXERIL) 5 MG tablet Take 1 tablet (5 mg total) by mouth 3 (three) times daily as needed for muscle spasms. 03/10/21   Renard Matter, MD  ?doxylamine, Sleep, (UNISOM) 25 MG tablet Take 25 mg by mouth at bedtime as needed.    [provider]  ?famotidine (PEPCID) 20 MG tablet Take 1 tablet (20 mg total) by mouth at bedtime. 04/16/21   Gabriel Carina, CNM  ?glucose blood test strip Use as instructed 03/01/21   Aletha Halim, MD  ?metroNIDAZOLE (FLAGYL) 500 MG tablet Take 1 tablet (500 mg total) by mouth 2 (two) times  daily for 7 days. 04/21/21 04/28/21  Genia Del, MD  ?omeprazole (PRILOSEC OTC) 20 MG tablet Take 1 tablet (20 mg total) by mouth daily. 04/19/21 05/19/21  Darlina Rumpf, CNM  ?Prenatal Vit-Fe Fumarate-FA (PRENATAL PO) Take by mouth.    [provider]  ?pyridoxine (B-6) 100 MG tablet Take 100 mg by mouth daily.    [provider]  ? ? ? ?Allergies    ?Fish allergy, Latex, and Penicillins ? ? ?Review of Systems   ?Review of Systems ?Please see HPI for pertinent positives and negatives ? ?Physical Exam ?BP 117/67   Pulse 100   Temp 98 ?F (36.7 ?C) (Oral)   Resp 18   Ht $R'4\' 8"'ak$  (1.422 m)   Wt 96.2 kg   LMP 10/16/2020   SpO2 99%   BMI 47.53 kg/m?  ? ?Physical Exam ? ?ED Results / Procedures / Treatments   ?EKG ?None ? ?Procedures ?Procedures ? ?Medications Ordered in the ED ?Medications - No data to display ? ?Initial Impression and Plan ? Patient with cramping abdominal pain in pregnancy with some pink vaginal spotting, seen for similar at MAU 2 days ago. Will place on fetal monitor and discuss with Ob.  ? ?ED Course  ? ?Clinical Course as of 04/23/21 0014  ?Thu Apr 22, 2021  ?2359 Per Rapid Ob, patient has not had any contractions and FHT are reassuring. She has requested we do  a cervix exam. With Chaperone present, using sterile gloves, cervix is high, closed. Cervix is posterior. No blood on glove. Otherwise patient is cleared for discharge. Advised to follow up with her Ob and to go to the MAU for any further pregnancy related concerns.  [CS]  ?  ?Clinical Course User Index ?[CS] Truddie Hidden, MD  ? ? ? ?MDM Rules/Calculators/A&P ?Medical Decision Making ?Given presenting complaint, I considered that admission might be necessary. After review of results from ED evaluation, admission to the hospital is not indicated at this time.  ? ? ?Problems Addressed: ?Abdominal pain in pregnancy, antepartum: acute illness or injury ? ?Amount and/or Complexity of Data Reviewed ?Labs:  ordered. Decision-making details documented in ED Course. ? ?Risk ?Decision regarding hospitalization. ? ? ? ?Final Clinical Impression(s) / ED Diagnoses ?Final diagnoses:  ?Abdominal pain in pregnancy, antepartum  ? ? ?Rx / DC Orders ?ED Discharge Orders   ? ? None  ? ?  ? ?  ?Truddie Hidden, MD ?04/23/21 [phone] ? ?

## 2021-04-23 NOTE — ED Notes (Signed)
Patient discharged to home.  All discharge instructions reviewed.  Patient verbalized understanding via teachback method.  VS WDL.  Respirations even and unlabored.  Ambulatory out of ED.   °

## 2021-04-23 NOTE — ED Notes (Addendum)
MD performed cervical exam. Witness present. MD states pts cervix is closed with no bleeding present at this time. OBRR updated. OBRR states pt is cleared with OB and to instruct pt to follow up outpatient. FHR remains around 150bpm. MD updated.  ?

## 2021-04-23 NOTE — Progress Notes (Signed)
Call received from [facility] RN regarding G3P1 patient at 26 wks with complaint of vaginal bleeding and uc's.  Pt states she is having pink blood when wiping and pain in her umbilicus area.  Pt was recently seen in MAU for similar complaint on 04/20/21.  Endorses positive FM, denies leaking of fluid.  Recently diagnosed with GDM per patient.  Care received with [facility] [employer]. ?2336 TOCO adjusted, abdomen palpates softly per RN at bedside. 2343 Dr. Para March notified of patient. Aware of hx. Instructions received for ED physician to perform SVE on patient.  If patient is closed she may follow up outpatient.  ?2350 [facility] RN made aware of plan of care.  Notified that urine culture should result tomorrow.  Will be contacted by OBGYN office with results if needed. ED physician performed SVE, closed. Category I tracing noted, no uc's present.  ?0001 Dr. Para March aware, present in department.  Pt is cleared OB.  Further instructions include to follow up with outpatient appts and come to MAU at [facility] for any further issues.  ?

## 2021-04-23 NOTE — MAU Provider Note (Cosign Needed)
Chief Complaint:  Emesis and Abdominal Pain   Event Date/Time   First Provider Initiated Contact with Patient 04/23/21 1525     HPI: Kristy Nguyen is a 31 y.o. G3P1011 at [redacted]w[redacted]d who presents to maternity admissions reporting increased nausea/vomiting today (since her ED visit overnight). Has not kept anything down all day including water. Still having abdominal pain on both sides of her umbilicus when walking.  Is undergoing PT for the lower abdominal pain and wears a support band (does not have with her) but does not think either are helping. No additional pink discharge noted since last ED visit. Has a headache from the vomiting and feels dehydrated. Expresses concern about positive chlamydia test and wants to know if that is why she still has lower abdominal pain. Denies vaginal bleeding, leaking of fluid, decreased fetal movement, fever, falls, or other recent illness.   Pregnancy Course: Prenatal records reviewed, has been seen in the MAU or a [facility] 15x since the start of her pregnancy. Receives care at [facility].  Past Medical History:  Diagnosis Date   Allergy    Diabetes mellitus without complication (HCC)    gestational   Gestational diabetes    Nexplanon insertion 10/28/2016   NSVD (normal spontaneous vaginal delivery) 09/17/2016   Supervision of high-risk pregnancy 04/05/2016    Clinic [facility] Prenatal Labs  Dating lmp and 12.4 week [place] Blood type: --/--/O POS (02/10 1138)   Genetic Screen 1 Screen:    AFP:     Quad:     NIPS: Antibody: negative  Anatomic [place]  Normal, anterior placenta, female  Rubella:  immune  GTT Early:               Third trimester:  Glucose, Fasting 65 - 91 mg/dL 95    Glucose, 1 hour 65 - 179 mg/dL 170    Glucose, 2 hour 65 - 152 mg/dL 017     RPR:   nega   OB History  Gravida Para Term Preterm AB Living  3 1 1  0 1 1  SAB IAB Ectopic Multiple Live Births  1 0 0   1    # Outcome Date GA Lbr Len/2nd Weight Sex Delivery Anes PTL Lv  3 Current           2 Term  09/15/16 [redacted]w[redacted]d / 00:13 7 lb 1.4 oz (3.215 kg) M Vag-Spont EPI  LIV  1 SAB 2010           Past Surgical History:  Procedure Laterality Date   DILATION AND EVACUATION  2010   Family History  Problem Relation Age of Onset   Hypertension Mother    Diabetes Father    Heart Problems Father    Anxiety disorder Sister    Depression Sister    Cancer Paternal Uncle        Brain   Diabetes Maternal Grandmother    Diabetes Maternal Grandfather    Diabetes Paternal Grandmother    Diabetes Paternal Grandfather    Social History   Tobacco Use   Smoking status: Former    Packs/day: 0.25    Types: Cigarettes   Smokeless tobacco: Former  2011 Use: Never used  Substance Use Topics   Alcohol use: No   Drug use: No   Allergies  Allergen Reactions   Fish Allergy Itching and Swelling   Latex Rash   Penicillins Itching, Swelling and Rash    Has patient had a PCN  reaction causing immediate rash, facial/tongue/throat swelling, SOB or lightheadedness with hypotension: No Has patient had a PCN reaction causing severe rash involving mucus membranes or skin necrosis: No Has patient had a PCN reaction that required hospitalization No Has patient had a PCN reaction occurring within the last 10 years: No If all of the above answers are "NO", then may proceed with Cephalosporin use.   Medications Prior to Admission  Medication Sig Dispense Refill Last Dose   aspirin EC 81 MG tablet Take 1 tablet (81 mg total) by mouth daily. 60 tablet 1 04/22/2021   calcium carbonate (TUMS) 500 MG chewable tablet Chew 1 tablet (200 mg of elemental calcium total) by mouth daily. 30 tablet 0 04/22/2021   cyclobenzaprine (FLEXERIL) 5 MG tablet Take 1 tablet (5 mg total) by mouth 3 (three) times daily as needed for muscle spasms. 15 tablet 0 04/22/2021   doxylamine, Sleep, (UNISOM) 25 MG tablet Take 25 mg by mouth at bedtime as needed.   04/22/2021   famotidine (PEPCID) 20 MG tablet Take 1 tablet (20 mg total) by  mouth at bedtime. 30 tablet 3 04/22/2021   metroNIDAZOLE (FLAGYL) 500 MG tablet Take 1 tablet (500 mg total) by mouth 2 (two) times daily for 7 days. 14 tablet 0 04/22/2021   Prenatal Vit-Fe Fumarate-FA (PRENATAL PO) Take by mouth.   04/22/2021   pyridoxine (B-6) 100 MG tablet Take 100 mg by mouth daily.   04/22/2021   Accu-Chek Softclix Lancets lancets Use as instructed 100 each 12    Blood Glucose Monitoring Suppl (ACCU-CHEK GUIDE) w/Device KIT 1 Device by Does not apply route in the morning, at noon, in the evening, and at bedtime. 1 kit 0    glucose blood test strip Use as instructed 100 each 12    omeprazole (PRILOSEC OTC) 20 MG tablet Take 1 tablet (20 mg total) by mouth daily. 30 tablet 0    I have reviewed patient's Past Medical Hx, Surgical Hx, Family Hx, Social Hx, medications and allergies.   ROS:  Review of Systems  Constitutional:  Positive for fatigue. Negative for fever.  HENT:  Negative for congestion and sore throat.   Eyes:  Negative for photophobia and visual disturbance.  Respiratory:  Negative for cough and shortness of breath.   Cardiovascular:  Negative for chest pain.  Gastrointestinal:  Positive for abdominal pain, nausea and vomiting. Negative for abdominal distention and diarrhea.  Musculoskeletal:  Negative for back pain.  Neurological:  Positive for headaches. Negative for dizziness, syncope and light-headedness.   Physical Exam  Patient Vitals for the past 24 hrs:  BP Temp Pulse Resp SpO2 Weight  04/23/21 1449 116/66 97.9 F (36.6 C) (!) 102 18 99 % 213 lb 6.4 oz (96.8 kg)   Physical Exam Vitals and nursing note reviewed.  Constitutional:      General: She is not in acute distress.    Appearance: She is well-developed. She is obese. She is not ill-appearing or diaphoretic.  HENT:     Head: Normocephalic and atraumatic.     Mouth/Throat:     Mouth: Mucous membranes are dry.  Eyes:     Pupils: Pupils are equal, round, and reactive to light.  Cardiovascular:      Rate and Rhythm: Normal rate and regular rhythm.     Pulses: Normal pulses.  Pulmonary:     Effort: Pulmonary effort is normal.  Abdominal:     General: There is no distension.     Palpations: Abdomen is  soft.     Tenderness: There is no abdominal tenderness. There is no guarding or rebound.     Hernia: No hernia is present.  Musculoskeletal:        General: Normal range of motion.  Skin:    General: Skin is warm and dry.     Capillary Refill: Capillary refill takes less than 2 seconds.  Neurological:     Mental Status: She is alert and oriented to person, place, and time.  Psychiatric:        Mood and Affect: Mood normal.        Behavior: Behavior normal.        Thought Content: Thought content normal.        Judgment: Judgment normal.    Fetal Tracing: reactive Baseline: 145 Variability: moderate Accelerations: 15x15 Decelerations: none Toco: no contractions noted during NST   Labs: Results for orders placed or performed during the hospital encounter of 04/23/21 (from the past 24 hour(s))  Urinalysis, Routine w reflex microscopic Urine, Clean Catch     Status: Abnormal   Collection Time: 04/23/21  4:27 PM  Result Value Ref Range   Color, Urine AMBER (A) YELLOW   APPearance CLOUDY (A) CLEAR   Specific Gravity, Urine 1.020 1.005 - 1.030   pH 6.0 5.0 - 8.0   Glucose, UA NEGATIVE NEGATIVE mg/dL   Hgb urine dipstick NEGATIVE NEGATIVE   Bilirubin Urine NEGATIVE NEGATIVE   Ketones, ur 5 (A) NEGATIVE mg/dL   Protein, ur NEGATIVE NEGATIVE mg/dL   Nitrite POSITIVE (A) NEGATIVE   Leukocytes,Ua NEGATIVE NEGATIVE   RBC / HPF 0-5 0 - 5 RBC/hpf   WBC, UA 11-20 0 - 5 WBC/hpf   Bacteria, UA MANY (A) NONE SEEN   Squamous Epithelial / LPF 0-5 0 - 5   Mucus PRESENT    Imaging:  No results found.  MAU Course: Orders Placed This Encounter  Procedures   Culture, OB Urine   Urinalysis, Routine w reflex microscopic   Discharge patient   Meds ordered this encounter   Medications   lactated ringers bolus 1,000 mL   ondansetron (ZOFRAN) injection 4 mg   famotidine (PEPCID) IVPB 20 mg premix   cefadroxil (DURICEF) 500 MG capsule    Sig: Take 1 capsule (500 mg total) by mouth 2 (two) times daily.    Dispense:  14 capsule    Refill:  0    Order Specific Question:   Supervising Provider    Answer:   Merrily Pew   MDM: Feels like she's having contractions when walking because of the sharp pains that come and go at work. None now. No tenderness on palpation, no evidence of a hernia. Suspect it is related to the ongoing pain related to supportive structures of the uterus. Advised to keep stretching/wearing support band.  Discussed chlamydia treatment and resolution - advised that it's likely still positive because she was tested too early. We will retest at her 3/21 visit. Explained that active vaginal infection can cause cramping and spotting and potentially as long as she is still testing positive this could be the case. But strongly reassured her that we do not think she was reinfected and her pain sounds related to what she was having prior to the infection.  Lr bolus, zofran and pepcid given for zofran with complete relief of nausea.  UA positive for nitrites and many bacteria, will send for culture and send in script for duricef as patient tolerated it well.  Assessment: 1. Nausea and vomiting in pregnancy   2. Urinary tract infection in mother during second trimester of pregnancy   3. [redacted] weeks gestation of pregnancy   4. NST (non-stress test) reactive    Plan: Discharge home in stable condition with return precautions.     Follow-up [place] [facility] Follow up.   Specialty: Obstetrics and Gynecology Why: as scheduled for ongoing prenatal care - please send questions to the office via MyChart or call if concerned. Contact information: [address] [place]  [place] [address] [phone]                Allergies as of 04/23/2021       Reactions   Fish Allergy Itching, Swelling   Latex Rash   Penicillins Itching, Swelling, Rash   Has patient had a PCN reaction causing immediate rash, facial/tongue/throat swelling, SOB or lightheadedness with hypotension: No Has patient had a PCN reaction causing severe rash involving mucus membranes or skin necrosis: No Has patient had a PCN reaction that required hospitalization No Has patient had a PCN reaction occurring within the last 10 years: No If all of the above answers are "NO", then may proceed with Cephalosporin use.        Medication List     TAKE these medications    Accu-Chek Guide w/Device Kit 1 Device by Does not apply route in the morning, at noon, in the evening, and at bedtime.   Accu-Chek Softclix Lancets lancets Use as instructed   aspirin EC 81 MG tablet Take 1 tablet (81 mg total) by mouth daily.   calcium carbonate 500 MG chewable tablet Commonly known as: Tums Chew 1 tablet (200 mg of elemental calcium total) by mouth daily.   cefadroxil 500 MG capsule Commonly known as: DURICEF Take 1 capsule (500 mg total) by mouth 2 (two) times daily.   cyclobenzaprine 5 MG tablet Commonly known as: FLEXERIL Take 1 tablet (5 mg total) by mouth 3 (three) times daily as needed for muscle spasms.   doxylamine (Sleep) 25 MG tablet Commonly known as: UNISOM Take 25 mg by mouth at bedtime as needed.   famotidine 20 MG tablet Commonly known as: Pepcid Take 1 tablet (20 mg total) by mouth at bedtime.   glucose blood test strip Use as instructed   metroNIDAZOLE 500 MG tablet Commonly known as: Flagyl Take 1 tablet (500 mg total) by mouth 2 (two) times daily for 7 days.   omeprazole 20 MG tablet Commonly known as: PriLOSEC OTC Take 1 tablet (20 mg total) by mouth daily.   PRENATAL PO Take by mouth.   pyridoxine 100 MG tablet Commonly known as: B-6 Take 100 mg  by mouth daily.       Gaylan Gerold, CNM, MSN, [place] Certified Nurse Midwife, [facility]

## 2021-04-26 ENCOUNTER — Encounter (HOSPITAL_COMMUNITY): Payer: Self-pay | Admitting: Obstetrics and Gynecology

## 2021-04-26 ENCOUNTER — Encounter: Payer: Self-pay | Admitting: Physical Therapy

## 2021-04-26 ENCOUNTER — Inpatient Hospital Stay (HOSPITAL_COMMUNITY)
Admission: AD | Admit: 2021-04-26 | Discharge: 2021-04-26 | Disposition: A | Discharge status: Home / Self Care | Payer: BC Managed Care – PPO | Attending: Obstetrics and Gynecology | Admitting: Obstetrics and Gynecology

## 2021-04-26 DIAGNOSIS — Z0371 Encounter for suspected problem with amniotic cavity and membrane ruled out: Secondary | ICD-10-CM | POA: Insufficient documentation

## 2021-04-26 DIAGNOSIS — Z3A27 27 weeks gestation of pregnancy: Secondary | ICD-10-CM | POA: Diagnosis not present

## 2021-04-26 LAB — CULTURE, OB URINE: Culture: >=100000 — AB

## 2021-04-26 NOTE — MAU Provider Note (Signed)
?History  ?  ? ?CSN: [id number] ? ?Arrival date and time: 04/26/21 2119 ? ? Event Date/Time  ? First Provider Initiated Contact with Patient 04/26/21 2207   ?  ? ?Chief Complaint  ?Patient presents with  ? Rupture of Membranes  ? ?HPI ?Kristy Nguyen is a 31 y.o. G3P1011 at 63w3dwho presents with possible leaking of fluid. She states she was using the bathroom and had a gush of creamy fluid at 1700. She has not had anymore since then. She reports intermittent cramping which she states is her normal. She denies any bleeding. Reports normal fetal movement.  ? ?OB History   ? ? Gravida  ?3  ? Para  ?1  ? Term  ?1  ? Preterm  ?0  ? AB  ?1  ? Living  ?1  ?  ? ? SAB  ?1  ? IAB  ?0  ? Ectopic  ?0  ? Multiple  ?   ? Live Births  ?1  ?   ?  ?  ? ? ?Past Medical History:  ?Diagnosis Date  ? Allergy   ? Diabetes mellitus without complication ([place]   ? gestational  ? Gestational diabetes   ? Nexplanon insertion 10/28/2016  ? NSVD (normal spontaneous vaginal delivery) 09/17/2016  ? Supervision of high-risk pregnancy 04/05/2016  ?  [facility]  Dating lmp and 12.4 week [place] type: --/--/O POS (02/10 1138)   Genetic Screen 1? Screen:    AFP:     Quad:     NIPS: Antibody: negative  Anatomic [place] Normal, anterior placenta, female  Rubella:  immune  GTT Early:               Third trimester:  Glucose, Fasting 65 - 91 mg/dL 95    Glucose, 1 hour 65 - 179 mg/dL 192    Glucose, 2 hour 65 - 152 mg/dL 150     RPR:   nega  ? ? ?Past Surgical History:  ?Procedure Laterality Date  ? DILATION AND EVACUATION  2010  ? ? ?Family History  ?Problem Relation Age of Onset  ? Hypertension Mother   ? Diabetes Father   ? Heart Problems Father   ? Anxiety disorder Sister   ? Depression Sister   ? Cancer Paternal Uncle   ?     Brain  ? Diabetes Maternal Grandmother   ? Diabetes Maternal Grandfather   ? Diabetes Paternal Grandmother   ? Diabetes Paternal Grandfather   ? ? ?Social History  ? ?Tobacco Use  ? Smoking status: Former  ?   Packs/day: 0.25  ?  Types: Cigarettes  ? Smokeless tobacco: Former  ?Vaping Use  ? Vaping Use: Never used  ?Substance Use Topics  ? Alcohol use: No  ? Drug use: No  ? ? ?Allergies:  ?Allergies  ?Allergen Reactions  ? Fish Allergy Itching and Swelling  ? Latex Rash  ? Penicillins Itching, Swelling and Rash  ?  Has patient had a PCN reaction causing immediate rash, facial/tongue/throat swelling, SOB or lightheadedness with hypotension: No ?Has patient had a PCN reaction causing severe rash involving mucus membranes or skin necrosis: No ?Has patient had a PCN reaction that required hospitalization No ?Has patient had a PCN reaction occurring within the last 10 years: No ?If all of the above answers are "NO", then may proceed with Cephalosporin use.  ? ? ?Medications Prior to Admission  ?Medication Sig Dispense Refill Last Dose  ?  aspirin EC 81 MG tablet Take 1 tablet (81 mg total) by mouth daily. 60 tablet 1 04/25/2021  ? calcium carbonate (TUMS) 500 MG chewable tablet Chew 1 tablet (200 mg of elemental calcium total) by mouth daily. 30 tablet 0 04/25/2021  ? cefadroxil (DURICEF) 500 MG capsule Take 1 capsule (500 mg total) by mouth 2 (two) times daily. 14 capsule 0 04/25/2021  ? cyclobenzaprine (FLEXERIL) 5 MG tablet Take 1 tablet (5 mg total) by mouth 3 (three) times daily as needed for muscle spasms. 15 tablet 0 04/25/2021  ? famotidine (PEPCID) 20 MG tablet Take 1 tablet (20 mg total) by mouth at bedtime. 30 tablet 3 04/25/2021  ? omeprazole (PRILOSEC OTC) 20 MG tablet Take 1 tablet (20 mg total) by mouth daily. 30 tablet 0 04/25/2021  ? Prenatal Vit-Fe Fumarate-FA (PRENATAL PO) Take by mouth.   04/25/2021  ? Accu-Chek Softclix Lancets lancets Use as instructed 100 each 12   ? Blood Glucose Monitoring Suppl (ACCU-CHEK GUIDE) w/Device KIT 1 Device by Does not apply route in the morning, at noon, in the evening, and at bedtime. 1 kit 0   ? doxylamine, Sleep, (UNISOM) 25 MG tablet Take 25 mg by mouth at bedtime as needed.      ? glucose blood test strip Use as instructed 100 each 12   ? metroNIDAZOLE (FLAGYL) 500 MG tablet Take 1 tablet (500 mg total) by mouth 2 (two) times daily for 7 days. 14 tablet 0   ? pyridoxine (B-6) 100 MG tablet Take 100 mg by mouth daily.     ? ? ?Review of Systems  ?Constitutional: Negative.  Negative for fatigue and fever.  ?HENT: Negative.    ?Respiratory: Negative.  Negative for shortness of breath.   ?Cardiovascular: Negative.  Negative for chest pain.  ?Gastrointestinal:  Positive for abdominal pain. Negative for constipation, diarrhea, nausea and vomiting.  ?Genitourinary:  Positive for vaginal discharge. Negative for dysuria and vaginal bleeding.  ?Neurological: Negative.  Negative for dizziness and headaches.  ?Physical Exam  ? ?Blood pressure (!) 110/59, pulse (!) 106, temperature 98.3 ?F (36.8 ?C), resp. rate 18, weight 97.6 kg, last menstrual period 10/16/2020, unknown if currently breastfeeding. ? ?Physical Exam ?Vitals and nursing note reviewed.  ?Constitutional:   ?   General: She is not in acute distress. ?   Appearance: She is well-developed.  ?HENT:  ?   Head: Normocephalic.  ?Eyes:  ?   Pupils: Pupils are equal, round, and reactive to light.  ?Cardiovascular:  ?   Rate and Rhythm: Normal rate and regular rhythm.  ?   Heart sounds: Normal heart sounds.  ?Pulmonary:  ?   Effort: Pulmonary effort is normal. No respiratory distress.  ?   Breath sounds: Normal breath sounds.  ?Abdominal:  ?   General: Bowel sounds are normal. There is no distension.  ?   Palpations: Abdomen is soft.  ?   Tenderness: There is no abdominal tenderness.  ?Genitourinary: ?   Comments: Pelvic exam: Cervix pink, visually closed, without lesion, scant white creamy discharge, vaginal walls and external genitalia normal ?Bimanual exam: Cervix 0/long/high, firm ? ?Skin: ?   General: Skin is warm and dry.  ?Neurological:  ?   Mental Status: She is alert and oriented to person, place, and time.  ?Psychiatric:     ?   Mood and  Affect: Mood normal.     ?   Behavior: Behavior normal.     ?   Thought Content: Thought content  normal.     ?   Judgment: Judgment normal.  ? ?Fetal Tracing: ? ?Baseline: 140 ?Variability: moderate ?Accels: 10x10 ?Decels: none ? ?Toco: 3 UCs, patient declines feeling any ? ? ?MAU Course  ?Procedures ? ?MDM ?SSE negative ?Fern negative ?Cervix closed/thick/posterior ? ?Reassurance provided and reviewed what SROM looks like typically and when to return for possible SROM ? ?Assessment and Plan  ? ?1. Encounter for suspected premature rupture of amniotic membranes, with rupture of membranes not found   ?2. [redacted] weeks gestation of pregnancy   ? ? ?-Discharge home in stable condition ?-Third trimester precautions discussed ?-Patient advised to follow-up with OB as scheduled for prenatal care ?-Patient may return to MAU as needed or if her condition were to change or worsen ? ? ?Wende Mott CNM ?04/26/2021, 10:07 PM  ?

## 2021-04-26 NOTE — MAU Note (Addendum)
PT SAYS AT 5 PM- WAS IN B-ROOM ?SAW WATER COME OUT OF VAGINA  ?NO FLUID SINCE  ?FEELS CRAMPING - NOT NEW ?ALSO BACK HURTS- NEW  ?NO SEX THIS MTH ? ?

## 2021-04-26 NOTE — Discharge Instructions (Signed)

## 2021-04-27 ENCOUNTER — Ambulatory Visit: Payer: BC Managed Care – PPO | Admitting: *Deleted

## 2021-04-27 ENCOUNTER — Other Ambulatory Visit: Payer: Self-pay

## 2021-04-27 ENCOUNTER — Ambulatory Visit: Payer: BC Managed Care – PPO | Attending: Obstetrics and Gynecology

## 2021-04-27 VITALS — BP 119/63 | HR 99

## 2021-04-27 DIAGNOSIS — Z362 Encounter for other antenatal screening follow-up: Secondary | ICD-10-CM | POA: Insufficient documentation

## 2021-04-27 DIAGNOSIS — O99212 Obesity complicating pregnancy, second trimester: Secondary | ICD-10-CM | POA: Diagnosis not present

## 2021-04-27 DIAGNOSIS — O0990 Supervision of high risk pregnancy, unspecified, unspecified trimester: Secondary | ICD-10-CM | POA: Insufficient documentation

## 2021-04-27 DIAGNOSIS — O24410 Gestational diabetes mellitus in pregnancy, diet controlled: Secondary | ICD-10-CM | POA: Insufficient documentation

## 2021-04-27 DIAGNOSIS — Z3A27 27 weeks gestation of pregnancy: Secondary | ICD-10-CM | POA: Diagnosis not present

## 2021-04-27 DIAGNOSIS — Z6841 Body Mass Index (BMI) 40.0 and over, adult: Secondary | ICD-10-CM | POA: Insufficient documentation

## 2021-04-28 ENCOUNTER — Other Ambulatory Visit: Payer: Self-pay | Admitting: *Deleted

## 2021-04-28 ENCOUNTER — Inpatient Hospital Stay (HOSPITAL_COMMUNITY)
Admission: AD | Admit: 2021-04-28 | Discharge: 2021-04-29 | Disposition: A | Discharge status: Home / Self Care | Payer: BC Managed Care – PPO | Attending: Obstetrics & Gynecology | Admitting: Obstetrics & Gynecology

## 2021-04-28 ENCOUNTER — Other Ambulatory Visit: Payer: Self-pay

## 2021-04-28 ENCOUNTER — Telehealth: Payer: Self-pay | Admitting: Family Medicine

## 2021-04-28 DIAGNOSIS — O219 Vomiting of pregnancy, unspecified: Secondary | ICD-10-CM

## 2021-04-28 DIAGNOSIS — O26892 Other specified pregnancy related conditions, second trimester: Secondary | ICD-10-CM | POA: Insufficient documentation

## 2021-04-28 DIAGNOSIS — R519 Headache, unspecified: Secondary | ICD-10-CM | POA: Insufficient documentation

## 2021-04-28 DIAGNOSIS — Z3A27 27 weeks gestation of pregnancy: Secondary | ICD-10-CM | POA: Insufficient documentation

## 2021-04-28 DIAGNOSIS — R638 Other symptoms and signs concerning food and fluid intake: Secondary | ICD-10-CM

## 2021-04-28 DIAGNOSIS — K5900 Constipation, unspecified: Secondary | ICD-10-CM | POA: Insufficient documentation

## 2021-04-28 DIAGNOSIS — O4702 False labor before 37 completed weeks of gestation, second trimester: Secondary | ICD-10-CM | POA: Insufficient documentation

## 2021-04-28 DIAGNOSIS — O24419 Gestational diabetes mellitus in pregnancy, unspecified control: Secondary | ICD-10-CM

## 2021-04-28 DIAGNOSIS — O24112 Pre-existing diabetes mellitus, type 2, in pregnancy, second trimester: Secondary | ICD-10-CM | POA: Insufficient documentation

## 2021-04-28 MED ORDER — PROMETHAZINE HCL 25 MG PO TABS: 25.0000 mg | ORAL_TABLET | Freq: Four times a day (QID) | ORAL | 0 refills | Status: DC | PRN

## 2021-04-28 NOTE — Telephone Encounter (Signed)
Patient called and stated she woke up with a very bad headache and some pain, cramping pain. She would like a call back to speak with a nurse. ?

## 2021-04-28 NOTE — MAU Note (Signed)
..  Kristy Nguyen is a 31 y.o. at [redacted]w[redacted]d here in MAU reporting: intermittent CTX that are not frequent and HA. Pt reports call her OB today, but they were not able to see her. Pt states she took Tylenol this morning, with no relief. Pt denies VB, LOF, abnormal discharge, and recent intercourse. GDM diet controlled. Pt states she has eaten today and stayed hydrated.  ? ?Onset of complaint: 0700 ?Pain score: 7/10 HA, 8/10 CTX ? ?Vitals:  ? 04/28/21 2350 04/28/21 2351  ?BP:  121/74  ?Pulse:  (!) 113  ?Resp:  18  ?Temp:  98.4 ?F (36.9 ?C)  ?SpO2: 99% 98%  ?   ?FHT:155 ?Lab orders placed from triage:  UA ? ?

## 2021-04-28 NOTE — Telephone Encounter (Addendum)
Called pt with interpreter Raquel. Patient reports a headache for 1 week. Patient does not have BP cuff at home. Per chart review, patient was seen in MFM yesterday and was normotensive. Encouraged pt to trial Tylenol for headache. Reports ongoing nausea. Phenergan prescribed per protocol. Explained this may also help with headache. Patient reports cramping in lower back that wraps around to abdomen. This pain has been present for 4 days. Reviewed possibility of contractions and encouraged good hydration. Explained if these cramps come every 10 minutes, patient should present to the hospital for evaluation. ?

## 2021-04-29 ENCOUNTER — Encounter (HOSPITAL_COMMUNITY): Payer: Self-pay

## 2021-04-29 ENCOUNTER — Encounter (HOSPITAL_COMMUNITY): Payer: Self-pay | Admitting: Obstetrics & Gynecology

## 2021-04-29 DIAGNOSIS — O26892 Other specified pregnancy related conditions, second trimester: Secondary | ICD-10-CM | POA: Diagnosis not present

## 2021-04-29 DIAGNOSIS — R519 Headache, unspecified: Secondary | ICD-10-CM

## 2021-04-29 DIAGNOSIS — O26893 Other specified pregnancy related conditions, third trimester: Secondary | ICD-10-CM

## 2021-04-29 DIAGNOSIS — Z3A27 27 weeks gestation of pregnancy: Secondary | ICD-10-CM | POA: Diagnosis not present

## 2021-04-29 DIAGNOSIS — O24112 Pre-existing diabetes mellitus, type 2, in pregnancy, second trimester: Secondary | ICD-10-CM | POA: Diagnosis not present

## 2021-04-29 DIAGNOSIS — K5900 Constipation, unspecified: Secondary | ICD-10-CM | POA: Diagnosis not present

## 2021-04-29 DIAGNOSIS — O479 False labor, unspecified: Secondary | ICD-10-CM

## 2021-04-29 DIAGNOSIS — O4702 False labor before 37 completed weeks of gestation, second trimester: Secondary | ICD-10-CM | POA: Diagnosis not present

## 2021-04-29 LAB — URINALYSIS, ROUTINE W REFLEX MICROSCOPIC
Bilirubin Urine: NEGATIVE
Glucose, UA: NEGATIVE mg/dL
Hgb urine dipstick: NEGATIVE
Ketones, ur: NEGATIVE mg/dL
Leukocytes,Ua: NEGATIVE
Nitrite: POSITIVE — AB
Protein, ur: NEGATIVE mg/dL
Specific Gravity, Urine: 1.019 (ref 1.005–1.030)
pH: 6 (ref 5.0–8.0)

## 2021-04-29 MED ORDER — DOCUSATE SODIUM 100 MG PO CAPS: 100.0000 mg | ORAL_CAPSULE | Freq: Two times a day (BID) | ORAL | 2 refills | Status: DC | PRN

## 2021-04-29 MED ORDER — POLYETHYLENE GLYCOL 3350 17 GM/SCOOP PO POWD: 17.0000 g | Freq: Every day | ORAL | 0 refills | Status: DC

## 2021-04-29 MED ORDER — ACETAMINOPHEN 500 MG PO TABS
1000.0000 mg | ORAL_TABLET | Freq: Once | ORAL | Status: AC
Start: 2021-04-29 — End: 2021-04-29
  Administered 2021-04-29: 1000 mg via ORAL
  Filled 2021-04-29: qty 2

## 2021-04-29 NOTE — MAU Provider Note (Signed)
Chief Complaint:  Contractions ? ? Event Date/Time  ? First Provider Initiated Contact with Patient 04/29/21 0047   ?  ? ?HPI: Kristy Nguyen is a 31 y.o. G3P1011 at [redacted]w[redacted]d who presents to maternity admissions reporting contractions every hour. She missed work today because she was in pain.  She also reports headache and constipation.  She is drinking plenty of fluid.   ?She reports good fetal movement. ? ? ? ?HPI ? ?Past Medical History: ?Past Medical History:  ?Diagnosis Date  ? Allergy   ? Diabetes mellitus without complication (HCC)   ? gestational  ? Gestational diabetes   ? Nexplanon insertion 10/28/2016  ? NSVD (normal spontaneous vaginal delivery) 09/17/2016  ? Supervision of high-risk pregnancy 04/05/2016  ?  Clinic [facility] Prenatal Labs  Dating lmp and 12.4 week [place] Blood type: --/--/O POS (02/10 1138)   Genetic Screen 1? Screen:    AFP:     Quad:     NIPS: Antibody: negative  Anatomic [place]  Normal, anterior placenta, female  Rubella:  immune  GTT Early:               Third trimester:  Glucose, Fasting 65 - 91 mg/dL 95    Glucose, 1 hour 65 - 179 mg/dL 928    Glucose, 2 hour 65 - 152 mg/dL 396     RPR:   nega  ? ? ?Past obstetric history: ?OB History  ?Gravida Para Term Preterm AB Living  ?3 1 1  0 1 1  ?SAB IAB Ectopic Multiple Live Births  ?1 0 0   1  ?  ?# Outcome Date GA Lbr Len/2nd Weight Sex Delivery Anes PTL Lv  ?3 Current           ?2 Term 09/15/16 [redacted]w[redacted]d / 00:13 3215 g M Vag-Spont EPI  LIV  ?1 SAB 2010          ? ? ?Past Surgical History: ?Past Surgical History:  ?Procedure Laterality Date  ? DILATION AND EVACUATION  2010  ? ? ?Family History: ?Family History  ?Problem Relation Age of Onset  ? Hypertension Mother   ? Diabetes Father   ? Heart Problems Father   ? Anxiety disorder Sister   ? Depression Sister   ? Cancer Paternal Uncle   ?     Brain  ? Diabetes Maternal Grandmother   ? Diabetes Maternal Grandfather   ? Diabetes Paternal Grandmother   ? Diabetes Paternal Grandfather   ? ? ?Social  History: ?Social History  ? ?Tobacco Use  ? Smoking status: Former  ?  Packs/day: 0.25  ?  Types: Cigarettes  ? Smokeless tobacco: Former  ?Vaping Use  ? Vaping Use: Never used  ?Substance Use Topics  ? Alcohol use: No  ? Drug use: No  ? ? ?Allergies:  ?Allergies  ?Allergen Reactions  ? Fish Allergy Itching and Swelling  ? Latex Rash  ? Penicillins Itching, Swelling and Rash  ?  Has patient had a PCN reaction causing immediate rash, facial/tongue/throat swelling, SOB or lightheadedness with hypotension: No ?Has patient had a PCN reaction causing severe rash involving mucus membranes or skin necrosis: No ?Has patient had a PCN reaction that required hospitalization No ?Has patient had a PCN reaction occurring within the last 10 years: No ?If all of the above answers are "NO", then may proceed with Cephalosporin use.  ? ? ?Meds:  ?No medications prior to admission.  ? ? ?ROS:  ?Review of Systems  ?Constitutional:  Negative for chills, fatigue and fever.  ?Eyes:  Negative for visual disturbance.  ?Respiratory:  Negative for shortness of breath.   ?Cardiovascular:  Negative for chest pain.  ?Gastrointestinal:  Negative for abdominal pain, nausea and vomiting.  ?Genitourinary:  Negative for difficulty urinating, dysuria, flank pain, pelvic pain, vaginal bleeding, vaginal discharge and vaginal pain.  ?Neurological:  Negative for dizziness and headaches.  ?Psychiatric/Behavioral: Negative.    ? ? ?I have reviewed patient's Past Medical Hx, Surgical Hx, Family Hx, Social Hx, medications and allergies.  ? ?Physical Exam  ?Patient Vitals for the past 24 hrs: ? BP Temp Temp src Pulse Resp SpO2 Height Weight  ?04/29/21 0057 118/73 (!) 97.1 ?F (36.2 ?C) Oral (!) 104 18 98 % -- --  ?04/29/21 0020 118/68 98.8 ?F (37.1 ?C) Oral (!) 120 18 99 % -- --  ?04/28/21 2351 121/74 98.4 ?F (36.9 ?C) Oral (!) 113 18 98 % 5' (1.524 m) 98 kg  ?04/28/21 2350 -- -- -- -- -- 99 % -- --  ? ?Constitutional: Well-developed, well-nourished female in  no acute distress.  ?Cardiovascular: normal rate ?Respiratory: normal effort ?GI: Abd soft, non-tender, gravid appropriate for gestational age.  ?MS: Extremities nontender, no edema, normal ROM ?Neurologic: Alert and oriented x 4.  ?GU: Neg CVAT. ? ?Dilation: Closed ?Effacement (%): Thick ?Exam by:: Aybree Blank, CNM ? ? ?Dilation: Closed ?Effacement (%): Thick ?Exam by:: Claudia Blank, CNM ? ?FHT:  Baseline 145 , moderate variability, accelerations present, no decelerations ?Contractions: None on toco or to palpation ?  ?Labs: ?Results for orders placed or performed during the hospital encounter of 04/28/21 (from the past 24 hour(s))  ?Urinalysis, Routine w reflex microscopic     Status: Abnormal  ? Collection Time: 04/29/21 12:24 AM  ?Result Value Ref Range  ? Color, Urine YELLOW YELLOW  ? APPearance HAZY (A) CLEAR  ? Specific Gravity, Urine 1.019 1.005 - 1.030  ? pH 6.0 5.0 - 8.0  ? Glucose, UA NEGATIVE NEGATIVE mg/dL  ? Hgb urine dipstick NEGATIVE NEGATIVE  ? Bilirubin Urine NEGATIVE NEGATIVE  ? Ketones, ur NEGATIVE NEGATIVE mg/dL  ? Protein, ur NEGATIVE NEGATIVE mg/dL  ? Nitrite POSITIVE (A) NEGATIVE  ? Leukocytes,Ua NEGATIVE NEGATIVE  ? RBC / HPF 0-5 0 - 5 RBC/hpf  ? WBC, UA 6-10 0 - 5 WBC/hpf  ? Bacteria, UA MANY (A) NONE SEEN  ? Squamous Epithelial / LPF 6-10 0 - 5  ? Mucus PRESENT   ? ?O/Positive/-- (12/15 1326) ? ?Imaging:  ? ?MAU Course/MDM: ?Orders Placed This Encounter  ?Procedures  ? Urinalysis, Routine w reflex microscopic  ? Discharge patient  ?  ?Meds ordered this encounter  ?Medications  ? acetaminophen (TYLENOL) tablet 1,000 mg  ? docusate sodium (COLACE) 100 MG capsule  ?  Sig: Take 1 capsule (100 mg total) by mouth 2 (two) times daily as needed.  ?  Dispense:  30 capsule  ?  Refill:  2  ?  Order Specific Question:   Supervising Provider  ?  Answer:   Woodroe Mode [[id number]]  ? polyethylene glycol powder (GLYCOLAX/MIRALAX) 17 GM/SCOOP powder  ?  Sig: Take 17 g by mouth daily.  ?   Dispense:  255 g  ?  Refill:  0  ?  Order Specific Question:   Supervising Provider  ?  Answer:   Woodroe Mode [[id number]]  ?  ? ?NST reviewed and appropriate for gestational age ?No evidence of PTL with closed cervix ?Stool palpated on pelvic  exam, pt reports recent constipation ?Rx for Colace and Miralax and discussed dietary changes ?Tylenol 1000 mg given in MAU, recommend Tylenol and/or caffeine at home for h/a ?Return to MAU for emergencies ?Keep scheduled appt in office  ? ? ? ?Assessment: ?1. Braxton Hicks contractions   ?2. Headache in pregnancy, antepartum, third trimester   ?3. Constipation during pregnancy in third trimester   ?4. [redacted] weeks gestation of pregnancy   ? ? ?Plan: ?Discharge home ?Labor precautions and fetal kick counts ? Follow-up Information   ? ? [facility] Follow up.   ?Specialty: Obstetrics and Gynecology ?Why: As scheduled ?Contact information: ?[address] ?[place] [address] ?[phone] ? ?  ?  ? ? [facility] Follow up.   ?Specialty: Obstetrics and Gynecology ?Why: As needed for emergencies ?Contact information: ?[address] ?[id number] ?[place] ?[phone] ? ?  ?  ? ?  ?  ? ?  ? ?Allergies as of 04/29/2021   ? ?   Reactions  ? Fish Allergy Itching, Swelling  ? Latex Rash  ? Penicillins Itching, Swelling, Rash  ? Has patient had a PCN reaction causing immediate rash, facial/tongue/throat swelling, SOB or lightheadedness with hypotension: No ?Has patient had a PCN reaction causing severe rash involving mucus membranes or skin necrosis: No ?Has patient had a PCN reaction that required hospitalization No ?Has patient had a PCN reaction occurring within the last 10 years: No ?If all of the above answers are "NO", then may proceed with Cephalosporin use.  ? ?  ? ?  ?Medication List  ?  ? ?STOP taking these medications   ? ?cefadroxil 500 MG capsule ?Commonly  known as: DURICEF ?  ? ?  ? ?TAKE these medications   ? ?Accu-Chek Guide w/Device Kit ?1 Device by Does not apply route in the morning, at noon, in the evening, and at bedtime. ?  ?Accu-Chek Softclix Lancets lancets

## 2021-05-03 ENCOUNTER — Other Ambulatory Visit: Payer: Self-pay

## 2021-05-03 ENCOUNTER — Inpatient Hospital Stay (HOSPITAL_COMMUNITY)
Admission: AD | Admit: 2021-05-03 | Discharge: 2021-05-04 | Disposition: A | Discharge status: Home / Self Care | Payer: BC Managed Care – PPO | Attending: Obstetrics & Gynecology | Admitting: Obstetrics & Gynecology

## 2021-05-03 ENCOUNTER — Ambulatory Visit: Payer: BC Managed Care – PPO | Admitting: Physical Therapy

## 2021-05-03 DIAGNOSIS — O368130 Decreased fetal movements, third trimester, not applicable or unspecified: Secondary | ICD-10-CM

## 2021-05-03 DIAGNOSIS — Z3A28 28 weeks gestation of pregnancy: Secondary | ICD-10-CM

## 2021-05-03 DIAGNOSIS — R519 Headache, unspecified: Secondary | ICD-10-CM | POA: Insufficient documentation

## 2021-05-03 DIAGNOSIS — O219 Vomiting of pregnancy, unspecified: Secondary | ICD-10-CM

## 2021-05-03 DIAGNOSIS — Z3683 Encounter for fetal screening for congenital cardiac abnormalities: Secondary | ICD-10-CM | POA: Insufficient documentation

## 2021-05-03 DIAGNOSIS — O212 Late vomiting of pregnancy: Secondary | ICD-10-CM | POA: Insufficient documentation

## 2021-05-03 DIAGNOSIS — O26893 Other specified pregnancy related conditions, third trimester: Secondary | ICD-10-CM

## 2021-05-03 DIAGNOSIS — O24415 Gestational diabetes mellitus in pregnancy, controlled by oral hypoglycemic drugs: Secondary | ICD-10-CM

## 2021-05-03 DIAGNOSIS — Z3689 Encounter for other specified antenatal screening: Secondary | ICD-10-CM

## 2021-05-04 ENCOUNTER — Encounter (HOSPITAL_COMMUNITY): Payer: Self-pay | Admitting: Obstetrics & Gynecology

## 2021-05-04 ENCOUNTER — Ambulatory Visit (INDEPENDENT_AMBULATORY_CARE_PROVIDER_SITE_OTHER): Payer: BC Managed Care – PPO | Admitting: Family Medicine

## 2021-05-04 DIAGNOSIS — R519 Headache, unspecified: Secondary | ICD-10-CM | POA: Diagnosis not present

## 2021-05-04 DIAGNOSIS — O219 Vomiting of pregnancy, unspecified: Secondary | ICD-10-CM | POA: Diagnosis not present

## 2021-05-04 DIAGNOSIS — Z91199 Patient's noncompliance with other medical treatment and regimen due to unspecified reason: Secondary | ICD-10-CM

## 2021-05-04 DIAGNOSIS — O26893 Other specified pregnancy related conditions, third trimester: Secondary | ICD-10-CM | POA: Diagnosis not present

## 2021-05-04 DIAGNOSIS — Z3A28 28 weeks gestation of pregnancy: Secondary | ICD-10-CM

## 2021-05-04 DIAGNOSIS — R000 Tachycardia, unspecified: Secondary | ICD-10-CM

## 2021-05-04 DIAGNOSIS — O0990 Supervision of high risk pregnancy, unspecified, unspecified trimester: Secondary | ICD-10-CM

## 2021-05-04 DIAGNOSIS — O4693 Antepartum hemorrhage, unspecified, third trimester: Secondary | ICD-10-CM | POA: Diagnosis not present

## 2021-05-04 DIAGNOSIS — Z3683 Encounter for fetal screening for congenital cardiac abnormalities: Secondary | ICD-10-CM | POA: Diagnosis not present

## 2021-05-04 DIAGNOSIS — O24415 Gestational diabetes mellitus in pregnancy, controlled by oral hypoglycemic drugs: Secondary | ICD-10-CM | POA: Diagnosis not present

## 2021-05-04 DIAGNOSIS — O368130 Decreased fetal movements, third trimester, not applicable or unspecified: Secondary | ICD-10-CM | POA: Diagnosis not present

## 2021-05-04 DIAGNOSIS — O212 Late vomiting of pregnancy: Secondary | ICD-10-CM | POA: Diagnosis not present

## 2021-05-04 LAB — GLUCOSE, CAPILLARY: Glucose-Capillary: 91 mg/dL (ref 70–99)

## 2021-05-04 MED ORDER — ACETAMINOPHEN 500 MG PO TABS
1000.0000 mg | ORAL_TABLET | Freq: Once | ORAL | Status: AC
  Administered 2021-05-04: 1000 mg via ORAL
  Filled 2021-05-04: qty 2

## 2021-05-04 MED ORDER — ONDANSETRON 4 MG PO TBDP
8.0000 mg | ORAL_TABLET | Freq: Once | ORAL | Status: AC
  Administered 2021-05-04: 8 mg via ORAL
  Filled 2021-05-04: qty 2

## 2021-05-04 NOTE — Progress Notes (Signed)
Patient no showed for visit. Message sent to schedulers to call patient to reschedule. ? ?Warner Mccreedy, MD, MPH ?OB Fellow, Faculty Practice ? ?

## 2021-05-04 NOTE — MAU Provider Note (Signed)
Chief Complaint:  Nausea, Headache, Contractions, and bright spots in vision ? ? Event Date/Time  ? First Provider Initiated Contact with Patient 05/04/21 [phone]   ?  ?HPI: Kristy Nguyen is a 31 y.o. G3P1011 at [redacted]w[redacted]d who presents to maternity admissions reporting nausea with vomiting (x3 episodes today), headache x5 days (took 325mg  Tylenol once, it did not work), decreased fetal movement x2 days but also says it hurts whenever the baby moves, seeing bright flashes while at work today (after she'd tried to eat and thrown up). Overall very anxious about the baby because of her traumatic delivery in 2018. Thinks the baby is growing too big and won't be ok. Is not checking her blood sugar. Has not taken anything for the nausea. Denies vaginal bleeding, leaking of fluid, fever, falls, or recent illness.  ? ?Pregnancy Course: Receives care at [facility] with 17 visits to MAU in this pregnancy prior to today. All for similar vague symptoms, mostly related to anxiety about the pregnancy. Has been referred to physical therapy for pelvic pain, does not do the regular exercises as prescribed. ? ?Past Medical History:  ?Diagnosis Date  ? Allergy   ? Diabetes mellitus without complication (HCC)   ? gestational  ? Gestational diabetes   ? Nexplanon insertion 10/28/2016  ? NSVD (normal spontaneous vaginal delivery) 09/17/2016  ? Supervision of high-risk pregnancy 04/05/2016  ?  Clinic [facility] Prenatal Labs  Dating lmp and 12.4 week [facility] Blood type: --/--/O POS (02/10 1138)   Genetic Screen 1? Screen:    AFP:     Quad:     NIPS: Antibody: negative  Anatomic [birth year]2002  Normal, anterior placenta, female  Rubella:  immune  GTT Early:               Third trimester:  Glucose, Fasting 65 - 91 mg/dL 95    Glucose, 1 hour 65 - 179 mg/dL [place]    Glucose, 2 hour 65 - 152 mg/dL 679     RPR:   nega  ? ?OB History  ?Gravida Para Term Preterm AB Living  ?3 1 1  0 1 1  ?SAB IAB Ectopic Multiple Live Births  ?1 0 0   1  ?  ?# Outcome Date GA Lbr Len/2nd Weight  Sex Delivery Anes PTL Lv  ?3 Current           ?2 Term 09/15/16 104w2d / 00:13 7 lb 1.4 oz (3.215 kg) M Vag-Spont EPI  LIV  ?1 SAB 2010          ? ?Past Surgical History:  ?Procedure Laterality Date  ? DILATION AND EVACUATION  2010  ? ?Family History  ?Problem Relation Age of Onset  ? Hypertension Mother   ? Diabetes Father   ? Heart Problems Father   ? Anxiety disorder Sister   ? Depression Sister   ? Cancer Paternal Uncle   ?     Brain  ? Diabetes Maternal Grandmother   ? Diabetes Maternal Grandfather   ? Diabetes Paternal Grandmother   ? Diabetes Paternal Grandfather   ? ?Social History  ? ?Tobacco Use  ? Smoking status: Former  ?  Packs/day: 0.25  ?  Types: Cigarettes  ? Smokeless tobacco: Former  ?Vaping Use  ? Vaping Use: Never used  ?Substance Use Topics  ? Alcohol use: No  ? Drug use: No  ? ?Allergies  ?Allergen Reactions  ? Fish Allergy Itching and Swelling  ? Latex Rash  ? Penicillins Itching, Swelling  and Rash  ?  Has patient had a PCN reaction causing immediate rash, facial/tongue/throat swelling, SOB or lightheadedness with hypotension: No ?Has patient had a PCN reaction causing severe rash involving mucus membranes or skin necrosis: No ?Has patient had a PCN reaction that required hospitalization No ?Has patient had a PCN reaction occurring within the last 10 years: No ?If all of the above answers are "NO", then may proceed with Cephalosporin use.  ? ?Medications Prior to Admission  ?Medication Sig Dispense Refill Last Dose  ? Accu-Chek Softclix Lancets lancets Use as instructed 100 each 12 05/03/2021  ? aspirin EC 81 MG tablet Take 1 tablet (81 mg total) by mouth daily. 60 tablet 1 05/03/2021  ? Blood Glucose Monitoring Suppl (ACCU-CHEK GUIDE) w/Device KIT 1 Device by Does not apply route in the morning, at noon, in the evening, and at bedtime. 1 kit 0 05/03/2021  ? calcium carbonate (TUMS) 500 MG chewable tablet Chew 1 tablet (200 mg of elemental calcium total) by mouth daily. 30 tablet 0 05/03/2021  ?  cyclobenzaprine (FLEXERIL) 5 MG tablet Take 1 tablet (5 mg total) by mouth 3 (three) times daily as needed for muscle spasms. 15 tablet 0 05/04/2021  ? doxylamine, Sleep, (UNISOM) 25 MG tablet Take 25 mg by mouth at bedtime as needed.   05/03/2021  ? famotidine (PEPCID) 20 MG tablet Take 1 tablet (20 mg total) by mouth at bedtime. 30 tablet 3 05/03/2021  ? promethazine (PHENERGAN) 25 MG tablet Take 1 tablet (25 mg total) by mouth every 6 (six) hours as needed for nausea or vomiting. 30 tablet 0 05/03/2021  ? pyridoxine (B-6) 100 MG tablet Take 100 mg by mouth daily.   05/03/2021  ? docusate sodium (COLACE) 100 MG capsule Take 1 capsule (100 mg total) by mouth 2 (two) times daily as needed. 30 capsule 2   ? glucose blood test strip Use as instructed 100 each 12   ? polyethylene glycol powder (GLYCOLAX/MIRALAX) 17 GM/SCOOP powder Take 17 g by mouth daily. 255 g 0   ? Prenatal Vit-Fe Fumarate-FA (PRENATAL PO) Take by mouth.     ? ?I have reviewed patient's Past Medical Hx, Surgical Hx, Family Hx, Social Hx, medications and allergies.  ? ?ROS:  ?Review of Systems ? ?Physical Exam  ?Patient Vitals for the past 24 hrs: ? BP Temp Temp src Pulse Resp SpO2 Height Weight  ?05/04/21 0004 110/66 97.9 ?F (36.6 ?C) Oral (!) 102 17 99 % 5' (1.524 m) 218 lb (98.9 kg)  ? ?Physical Exam ?Vitals reviewed.  ?Constitutional:   ?   General: She is not in acute distress. ?   Appearance: She is well-developed. She is obese. She is not ill-appearing.  ?HENT:  ?   Head: Normocephalic and atraumatic.  ?   Mouth/Throat:  ?   Mouth: Mucous membranes are dry.  ?Eyes:  ?   Pupils: Pupils are equal, round, and reactive to light.  ?Cardiovascular:  ?   Rate and Rhythm: Regular rhythm. Tachycardia present.  ?Pulmonary:  ?   Effort: Pulmonary effort is normal.  ?Abdominal:  ?   Palpations: Abdomen is soft.  ?Genitourinary: ?   Comments: Exam deferred, not contracting ?Musculoskeletal:     ?   General: Normal range of motion.  ?   Cervical back: Normal  range of motion.  ?Skin: ?   General: Skin is warm and dry.  ?   Capillary Refill: Capillary refill takes less than 2 seconds.  ?Neurological:  ?  Mental Status: She is alert and oriented to person, place, and time.  ?Psychiatric:     ?   Mood and Affect: Mood normal.     ?   Behavior: Behavior normal.     ?   Thought Content: Thought content normal.     ?   Judgment: Judgment normal.  ?  ?Fetal Tracing: reactive with palpable movement ?Baseline: 135 ?Variability: moderate ?Accelerations: 15x15 ?Decelerations: none ?Toco: relaxed ?  ?Labs: ?Results for orders placed or performed during the hospital encounter of 05/03/21 (from the past 24 hour(s))  ?Glucose, capillary     Status: None  ? Collection Time: 05/04/21  1:16 AM  ?Result Value Ref Range  ? Glucose-Capillary 91 70 - 99 mg/dL  ? ? ?Imaging:  ?No results found. ? ?MAU Course: ?Orders Placed This Encounter  ?Procedures  ? Glucose, capillary  ? Encourage fluids  ? Discharge patient  ? ?Meds ordered this encounter  ?Medications  ? ondansetron (ZOFRAN-ODT) disintegrating tablet 8 mg  ? acetaminophen (TYLENOL) tablet 1,000 mg  ? ?MDM: ?ODT zofran, tylenol and oral fluids ordered. Long conversation with pt about the importance of self-care: eating regularly, taking meds for symptoms, staying hydrated and appropriate expectations with known pelvic pain. Encouraged to use her maternity belt, stretch before bed etc. Offered weekly RN visits to hear the baby's heartbeat which she agreed might be helpful for her pregnancy anxiety. Will send a message to the office to get those scheduled. ? ?Nausea and headache relieved by meds, encouraged to go home and sleep then eat and hydrate well in the morning. ? ?Assessment: ?1. Nausea and vomiting during pregnancy   ?2. Decreased fetal movements in third trimester, single or unspecified fetus   ?3. Gestational diabetes mellitus (GDM) in third trimester controlled on oral hypoglycemic drug   ?4. NST (non-stress test) reactive    ?5. Pregnancy headache in third trimester   ?6. [redacted] weeks gestation of pregnancy   ? ?Plan: ?Discharge home in stable condition with return precautions  ?  ? Follow-up Information   ? ? [facility] for [facility]

## 2021-05-04 NOTE — MAU Note (Signed)
Kristy Nguyen is a 31 y.o. at [redacted]w[redacted]d here in MAU reporting: pt states she has not been able to eat today due to nausea-vomited x3-liquid and solid food. Pt also reports bright spots in her vision -also uterine cramping every hour. Decreased fetal movement x2 days. Pt also reports headache x 5 days-frontal. ? ?Vitals:  ? 05/04/21 0004  ?BP: 110/66  ?Pulse: (!) 102  ?Resp: 17  ?Temp: 97.9 ?F (36.6 ?C)  ?SpO2: 99%  ?   ?FHT:137bpm ?   ?

## 2021-05-05 ENCOUNTER — Inpatient Hospital Stay (HOSPITAL_BASED_OUTPATIENT_CLINIC_OR_DEPARTMENT_OTHER): Payer: BC Managed Care – PPO

## 2021-05-05 ENCOUNTER — Other Ambulatory Visit: Payer: Self-pay

## 2021-05-05 ENCOUNTER — Telehealth: Payer: Self-pay | Admitting: Family Medicine

## 2021-05-05 ENCOUNTER — Encounter (HOSPITAL_COMMUNITY): Payer: Self-pay | Admitting: Obstetrics and Gynecology

## 2021-05-05 ENCOUNTER — Inpatient Hospital Stay (HOSPITAL_COMMUNITY)
Admission: AD | Admit: 2021-05-05 | Discharge: 2021-05-05 | Disposition: A | Discharge status: Home / Self Care | Payer: BC Managed Care – PPO | Attending: Obstetrics and Gynecology | Admitting: Obstetrics and Gynecology

## 2021-05-05 DIAGNOSIS — O4693 Antepartum hemorrhage, unspecified, third trimester: Secondary | ICD-10-CM

## 2021-05-05 DIAGNOSIS — Z3A28 28 weeks gestation of pregnancy: Secondary | ICD-10-CM | POA: Diagnosis not present

## 2021-05-05 DIAGNOSIS — K5900 Constipation, unspecified: Secondary | ICD-10-CM | POA: Insufficient documentation

## 2021-05-05 DIAGNOSIS — O26853 Spotting complicating pregnancy, third trimester: Secondary | ICD-10-CM | POA: Insufficient documentation

## 2021-05-05 DIAGNOSIS — Z3689 Encounter for other specified antenatal screening: Secondary | ICD-10-CM

## 2021-05-05 DIAGNOSIS — K649 Unspecified hemorrhoids: Secondary | ICD-10-CM | POA: Diagnosis not present

## 2021-05-05 DIAGNOSIS — R1030 Lower abdominal pain, unspecified: Secondary | ICD-10-CM | POA: Insufficient documentation

## 2021-05-05 DIAGNOSIS — O99613 Diseases of the digestive system complicating pregnancy, third trimester: Secondary | ICD-10-CM | POA: Diagnosis not present

## 2021-05-05 DIAGNOSIS — O26893 Other specified pregnancy related conditions, third trimester: Secondary | ICD-10-CM | POA: Diagnosis not present

## 2021-05-05 LAB — URINALYSIS, ROUTINE W REFLEX MICROSCOPIC
Bilirubin Urine: NEGATIVE
Glucose, UA: NEGATIVE mg/dL
Hgb urine dipstick: NEGATIVE
Ketones, ur: NEGATIVE mg/dL
Leukocytes,Ua: NEGATIVE
Nitrite: POSITIVE — AB
Protein, ur: NEGATIVE mg/dL
Specific Gravity, Urine: 1.021 (ref 1.005–1.030)
pH: 6 (ref 5.0–8.0)

## 2021-05-05 LAB — CBC
HCT: 31.9 % — ABNORMAL LOW (ref 36.0–46.0)
Hemoglobin: 10.6 g/dL — ABNORMAL LOW (ref 12.0–15.0)
MCH: 30.1 pg (ref 26.0–34.0)
MCHC: 33.2 g/dL (ref 30.0–36.0)
MCV: 90.6 fL (ref 80.0–100.0)
Platelets: 298 10*3/uL (ref 150–400)
RBC: 3.52 MIL/uL — ABNORMAL LOW (ref 3.87–5.11)
RDW: 13 % (ref 11.5–15.5)
WBC: 11.4 10*3/uL — ABNORMAL HIGH (ref 4.0–10.5)
nRBC: 0 % (ref 0.0–0.2)

## 2021-05-05 MED ORDER — ACETAMINOPHEN 500 MG PO TABS
1000.0000 mg | ORAL_TABLET | Freq: Once | ORAL | Status: AC
  Administered 2021-05-05: 1000 mg via ORAL
  Filled 2021-05-05: qty 2

## 2021-05-05 MED ORDER — CYCLOBENZAPRINE HCL 5 MG PO TABS
10.0000 mg | ORAL_TABLET | Freq: Once | ORAL | Status: DC
Start: 2021-05-05 — End: 2021-05-06
  Filled 2021-05-05: qty 2

## 2021-05-05 MED ORDER — ONDANSETRON 4 MG PO TBDP
4.0000 mg | ORAL_TABLET | Freq: Once | ORAL | Status: AC
Start: 2021-05-05 — End: 2021-05-05
  Administered 2021-05-05: 4 mg via ORAL
  Filled 2021-05-05: qty 1

## 2021-05-05 MED ORDER — CEFADROXIL 500 MG PO CAPS: 500.0000 mg | ORAL_CAPSULE | Freq: Two times a day (BID) | ORAL | 0 refills | Status: DC

## 2021-05-05 MED ORDER — DOCUSATE SODIUM 100 MG PO CAPS: 100.0000 mg | ORAL_CAPSULE | Freq: Two times a day (BID) | ORAL | 2 refills | Status: DC | PRN

## 2021-05-05 NOTE — MAU Provider Note (Signed)
?History  ?  ? ?CSN: [id number] ? ?Arrival date and time: 05/05/21 1736 ? ? Event Date/Time  ? First Provider Initiated Contact with Patient 05/05/21 1843   ?  ? ?Chief Complaint  ?Patient presents with  ? Abdominal Pain  ? Vaginal Bleeding  ? ?HPI ?Kristy Nguyen is a 31 y.o. G3P1011 at 52w5dwho presents to MAU with chief complaint of vaginal bleeding. Patient reports seeing 3 smears of blood when she wiped after voiding earlier today. ? ?Patient reports intermittent lower abdominal cramping. Pain score 8/10. She has not taken medication for this complaint. She was diagnosed with a UTI in early March and states she finished all medication. She denies dysuria, hematuria, flank pain, fever. ? ?Patient reports recurrent constipation and significant straining with bowel movements. She states she does not know what a hemorrhoid is and is not sure she has one.  ? ?She denies additional episodes of vaginal bleeding, leaking of fluid, decreased fetal movement, fever, falls, or recent illness.  ? ? ?OB History   ? ? Gravida  ?3  ? Para  ?1  ? Term  ?1  ? Preterm  ?0  ? AB  ?1  ? Living  ?1  ?  ? ? SAB  ?1  ? IAB  ?0  ? Ectopic  ?0  ? Multiple  ?   ? Live Births  ?1  ?   ?  ?  ? ? ?Past Medical History:  ?Diagnosis Date  ? Allergy   ? Diabetes mellitus [place]   ? gestational  ? Gestational diabetes   ? Nexplanon insertion 10/28/2016  ? NSVD (normal spontaneous vaginal delivery) 09/17/2016  ? Supervision of high-risk pregnancy 04/05/2016  ?  [facility]  Dating lmp and 12.4 week UKoreaBlood type: --/--/O POS (02/10 1138)   Genetic Screen 1? Screen:    AFP:     Quad:     NIPS: Antibody: negative  Anatomic [place] Normal, anterior placenta, female  Rubella:  immune  GTT Early:               Third trimester:  Glucose, Fasting 65 - 91 mg/dL 95    Glucose, 1 hour 65 - 179 mg/dL 192    Glucose, 2 hour 65 - 152 mg/dL 150     RPR:   nega  ? ? ?Past Surgical History:  ?Procedure Laterality Date  ? DILATION  AND EVACUATION  2010  ? ? ?Family History  ?Problem Relation Age of Onset  ? Hypertension Mother   ? Diabetes Father   ? Heart Problems Father   ? Anxiety disorder Sister   ? Depression Sister   ? Cancer Paternal Uncle   ?     Brain  ? Diabetes Maternal Grandmother   ? Diabetes Maternal Grandfather   ? Diabetes Paternal Grandmother   ? Diabetes Paternal Grandfather   ? ? ?Social History  ? ?Tobacco Use  ? Smoking status: Former  ?  Packs/day: 0.25  ?  Types: Cigarettes  ? Smokeless tobacco: Former  ?Vaping Use  ? Vaping Use: Never used  ?Substance Use Topics  ? Alcohol use: No  ? Drug use: No  ? ? ?Allergies:  ?Allergies  ?Allergen Reactions  ? Fish Allergy Itching and Swelling  ? Latex Rash  ? Penicillins Itching, Swelling and Rash  ?  Has patient had a PCN reaction causing immediate rash, facial/tongue/throat swelling, SOB or lightheadedness with hypotension: No ?Has  patient had a PCN reaction causing severe rash involving mucus membranes or skin necrosis: No ?Has patient had a PCN reaction that required hospitalization No ?Has patient had a PCN reaction occurring within the last 10 years: No ?If all of the above answers are "NO", then may proceed with Cephalosporin use.  ? ? ?Medications Prior to Admission  ?Medication Sig Dispense Refill Last Dose  ? Accu-Chek Softclix Lancets lancets Use as instructed 100 each 12 05/04/2021  ? aspirin EC 81 MG tablet Take 1 tablet (81 mg total) by mouth daily. 60 tablet 1 05/04/2021  ? Blood Glucose Monitoring Suppl (ACCU-CHEK GUIDE) w/Device KIT 1 Device by Does not apply route in the morning, at noon, in the evening, and at bedtime. 1 kit 0 05/04/2021  ? calcium carbonate (TUMS) 500 MG chewable tablet Chew 1 tablet (200 mg of elemental calcium total) by mouth daily. 30 tablet 0 05/04/2021  ? cyclobenzaprine (FLEXERIL) 5 MG tablet Take 1 tablet (5 mg total) by mouth 3 (three) times daily as needed for muscle spasms. 15 tablet 0 05/04/2021  ? docusate sodium (COLACE) 100 MG capsule  Take 1 capsule (100 mg total) by mouth 2 (two) times daily as needed. 30 capsule 2 05/04/2021  ? famotidine (PEPCID) 20 MG tablet Take 1 tablet (20 mg total) by mouth at bedtime. 30 tablet 3 05/04/2021  ? glucose blood test strip Use as instructed 100 each 12 05/04/2021  ? polyethylene glycol powder (GLYCOLAX/MIRALAX) 17 GM/SCOOP powder Take 17 g by mouth daily. 255 g 0 05/04/2021  ? promethazine (PHENERGAN) 25 MG tablet Take 1 tablet (25 mg total) by mouth every 6 (six) hours as needed for nausea or vomiting. 30 tablet 0 05/04/2021  ? doxylamine, Sleep, (UNISOM) 25 MG tablet Take 25 mg by mouth at bedtime as needed.     ? Prenatal Vit-Fe Fumarate-FA (PRENATAL PO) Take by mouth.     ? pyridoxine (B-6) 100 MG tablet Take 100 mg by mouth daily.     ? ? ?Review of Systems  ?Gastrointestinal:  Positive for abdominal pain.  ?Genitourinary:  Positive for vaginal bleeding.  ?All other systems reviewed and are negative. ?Physical Exam  ? ?Blood pressure 111/66, pulse 90, temperature 97.8 ?F (36.6 ?C), temperature source Oral, resp. rate 18, height 5' (1.524 m), weight 98.2 kg, last menstrual period 10/16/2020, SpO2 98 %, unknown if currently breastfeeding. ? ?Physical Exam ?Vitals and nursing note reviewed. Exam conducted with a chaperone present.  ?Constitutional:   ?   Appearance: She is well-developed.  ?Cardiovascular:  ?   Rate and Rhythm: Normal rate and regular rhythm.  ?   Heart sounds: Normal heart sounds.  ?Pulmonary:  ?   Effort: Pulmonary effort is normal.  ?   Breath sounds: Normal breath sounds.  ?Abdominal:  ?   Palpations: Abdomen is soft.  ?   Tenderness: There is no abdominal tenderness. There is no right CVA tenderness or left CVA tenderness.  ?Genitourinary: ?   Rectum: External hemorrhoid present.  ?   Comments: Pelvic exam: External genitalia normal, vaginal walls pink and well rugated, cervix visually closed, no lesions noted. No bleeding or abnormal discharge observed ? ? ?Neurological:  ?   Mental  Status: She is alert.  ? ? ?MAU Course  ?Procedures ? ?MDM ? ?--Patient without external signs of discomfort during MAU encounter ?--No abnormal findings on  speculum exam ?--Incidental finding of nitrites, consistent with previous UAs. Patient with lower abdominal pain but denies dysuria, frequency, fever  hematuria, abdominal tenderness. Now [redacted]w[redacted]d Will initiate treatment for UTI and send culture ?--Discussed possible bleeding from small external hemorrhoid. Advised high fiber diet, Colace, increase water intake ?--Reactive tracing: baseline 130, mod var, + accels, no decels ?--Toco: quiet ? ?Patient Vitals for the past 24 hrs: ? BP Temp Temp src Pulse Resp SpO2 Height Weight  ?05/05/21 2048 (!) 115/53 -- -- 91 -- -- -- --  ?05/05/21 1810 111/66 97.8 ?F (36.6 ?C) Oral 90 18 98 % 5' (1.524 m) 98.2 kg  ? ?Orders Placed This Encounter  ?Procedures  ? Culture, OB Urine  ? UKoreaMFM OB Limited  ? Urinalysis, Routine w reflex microscopic Urine, Clean Catch  ? CBC  ? Discharge patient  ? ?Results for orders placed or performed during the hospital encounter of 05/05/21 (from the past 24 hour(s))  ?Urinalysis, Routine w reflex microscopic Urine, Clean Catch     Status: Abnormal  ? Collection Time: 05/05/21  6:25 PM  ?Result Value Ref Range  ? Color, Urine YELLOW YELLOW  ? APPearance HAZY (A) CLEAR  ? Specific Gravity, Urine 1.021 1.005 - 1.030  ? pH 6.0 5.0 - 8.0  ? Glucose, UA NEGATIVE NEGATIVE mg/dL  ? Hgb urine dipstick NEGATIVE NEGATIVE  ? Bilirubin Urine NEGATIVE NEGATIVE  ? Ketones, ur NEGATIVE NEGATIVE mg/dL  ? Protein, ur NEGATIVE NEGATIVE mg/dL  ? Nitrite POSITIVE (A) NEGATIVE  ? Leukocytes,Ua NEGATIVE NEGATIVE  ? RBC / HPF 0-5 0 - 5 RBC/hpf  ? WBC, UA 11-20 0 - 5 WBC/hpf  ? Bacteria, UA MANY (A) NONE SEEN  ? Squamous Epithelial / LPF 0-5 0 - 5  ? Mucus PRESENT   ?CBC     Status: Abnormal  ? Collection Time: 05/05/21  7:00 PM  ?Result Value Ref Range  ? WBC 11.4 (H) 4.0 - 10.5 K/uL  ? RBC 3.52 (L) 3.87 - 5.11 MIL/uL   ? Hemoglobin 10.6 (L) 12.0 - 15.0 g/dL  ? HCT 31.9 (L) 36.0 - 46.0 %  ? MCV 90.6 80.0 - 100.0 fL  ? MCH 30.1 26.0 - 34.0 pg  ? MCHC 33.2 30.0 - 36.0 g/dL  ? RDW 13.0 11.5 - 15.5 %  ? Platelets 298 150 - 400

## 2021-05-05 NOTE — Telephone Encounter (Signed)
Called patient to rescheduled missed appointment, there was no answer so a voicemail was left and a mychart message was sent.  ?

## 2021-05-05 NOTE — MAU Note (Signed)
Kristy Nguyen is a 31 y.o. at [redacted]w[redacted]d here in MAU reporting: is worried, bleeding all day today.  Only sees when she wipes.  cramping ? ?Onset of complaint: this morning ?Pain score: 8 ?Vitals:  ? 05/05/21 1810  ?BP: 111/66  ?Pulse: 90  ?Resp: 18  ?Temp: 97.8 ?F (36.6 ?C)  ?SpO2: 98%  ?   ?FHT:152 ?Lab orders placed from triage:  urine ?

## 2021-05-06 ENCOUNTER — Telehealth: Payer: Self-pay | Admitting: Family Medicine

## 2021-05-06 ENCOUNTER — Ambulatory Visit (INDEPENDENT_AMBULATORY_CARE_PROVIDER_SITE_OTHER): Payer: BC Managed Care – PPO | Admitting: Family Medicine

## 2021-05-06 VITALS — BP 103/71 | HR 93 | Wt 215.0 lb

## 2021-05-06 DIAGNOSIS — Z789 Other specified health status: Secondary | ICD-10-CM

## 2021-05-06 DIAGNOSIS — O0990 Supervision of high risk pregnancy, unspecified, unspecified trimester: Secondary | ICD-10-CM

## 2021-05-06 DIAGNOSIS — O09299 Supervision of pregnancy with other poor reproductive or obstetric history, unspecified trimester: Secondary | ICD-10-CM

## 2021-05-06 DIAGNOSIS — O3663X0 Maternal care for excessive fetal growth, third trimester, not applicable or unspecified: Secondary | ICD-10-CM

## 2021-05-06 DIAGNOSIS — O24410 Gestational diabetes mellitus in pregnancy, diet controlled: Secondary | ICD-10-CM

## 2021-05-06 MED ORDER — METFORMIN HCL 500 MG PO TABS: 500.0000 mg | ORAL_TABLET | Freq: Two times a day (BID) | ORAL | 2 refills | Status: DC

## 2021-05-06 NOTE — Patient Instructions (Signed)
Tercer trimestre de [place] ?Third Trimester of Pregnancy ?El tercer trimestre de Public librarian desde la semana 28 hasta la semana 40. Esto corresponde a los meses 7 a 9. El tercer trimestre es un per?odo en el que el beb? en gestaci?n (feto) crece r?pidamente. Hacia el final del noveno mes, el feto mide alrededor de 20?pulgadas (45?cm) de largo y pesa entre 6 y 44 libras (2.7 y 4.5?kg). ?Cambios en el cuerpo durante el tercer trimestre ?Durante el tercer trimestre, su cuerpo contin?a experimentando numerosos cambios. Los cambios var?an y generalmente vuelven a la normalidad despu?s del nacimiento del beb?Marland Kitchen ?Cambios f?sicos ?Seguir? aumentando de [place]. Es de esperar que aumente entre 25 y 32?libras (27 y 16?kg) Optometrist final del embarazo si inicia el Media planner con un peso normal. Si tiene bajo [place], es de esperar que aumente entre 28 y 70 libras (35 y?18 kg), y si tiene sobrepeso, es de esperar que aumente entre 77 y 25 libras (7 y 11?kg). ?Podr?n aparecer las primeras estr?as en las caderas, el abdomen y [place]. ?Las mamas seguir?n creciendo y Tourist information centre manager. Un l?quido [place] Public affairs consultant) puede salir de sus pechos. Esta es la primera leche que usted produce para su beb?Marland Kitchen ?Tal vez haya cambios en el cabello. Esto cambios pueden incluir su engrosamiento, crecimiento r?pido y [employer]son textura. A algunas personas tambi?n se les cae el cabello durante o despu?s del [place], o tienen el cabello seco o fino. ?El ombligo puede salir hacia afuera. ?Puede observar que se le Micron Technology, el rostro o los tobillos. ?Cambios en la salud ?Es posible que tenga acidez estomacal. ?Puede sufrir estre?imiento. ?Puede desarrollar hemorroides. ?Puede desarrollar venas hinchadas y abultadas (venas varicosas) en las piernas. ?Puede presentar m?s dolor en la pelvis, la espalda o los muslos. Esto se debe al [employer] Company de peso y al aumento de las hormonas que relajan las articulaciones. ?Puede presentar un aumento del hormigueo o  entumecimiento en las manos, brazos y piernas. La piel de su abdomen tambi?n puede sentirse entumecida. ?Puede sentir que le falta el aire debido a que se expande el ?tero. ?Otros cambios ?Puede tener necesidad de Garment/textile technologist con m?s frecuencia porque el feto baja hacia la pelvis y ejerce presi?n sobre la vejiga. ?Puede tener m?s problemas para dormir. Esto puede deberse al tama?o de su abdomen, una mayor necesidad de Garment/textile technologist y un aumento en el metabolismo de su cuerpo. ?Puede notar que el feto ?baja? o lo siente m?s bajo, en el abdomen (aligeramiento). ?Puede tener un aumento de la secreci?n vaginal. ?Puede notar que tiene dolor alrededor del hueso p?lvico a medida que el ?tero se distiende. ?Siga estas instrucciones en su casa: ?Medicamentos ?Siga las instrucciones del m?dico en relaci?n con el uso de medicamentos. Durante el embarazo, hay medicamentos que pueden tomarse y 50 que no. No tome ning?n medicamento a menos que lo haya autorizado el m?dico. ?Tome vitaminas prenatales que contengan por lo menos 600?microgramos (mcg) de ?cido f?lico. ?Comida y bebida ?Lleve una dieta saludable que incluya frutas y verduras frescas, cereales integrales, buenas fuentes de prote?nas como carnes magras, huevos o tofu, y productos l?cteos descremados. ?Evite la carne cruda y [place], la [place] y el queso sin Radio producer. Estos portan g?rmenes que Dentist?o tanto a usted como al beb?Marland Kitchen ?Tome 4 o 5 comidas peque?as en lugar de 3 comidas abundantes al d?a. ?Es posible que tenga que tomar estas medidas para prevenir o tratar el estre?imiento: ?Electronics engineer suficiente l?quido como para Consulting civil engineer [employer]  de color [place] p?lido. ?Consumir alimentos ricos en fibra, como frijoles, cereales integrales, y frutas y verduras frescas. ?Limitar el consumo de alimentos ricos en grasa y az?cares procesados, como los alimentos fritos o dulces. ?Actividad ?Haga ejercicio solamente como se lo haya indicado el m?dico. La mayor?a de las personas  pueden continuar su actividad f?sica habitual durante el [place]. Intente realizar como m?nimo 30?minutos de actividad f?sica por lo menos 5?d?as a Best boy. Deje de hacer ejercicio si experimenta contracciones en el ?tero. ?Deje de hacer ejercicio si le aparecen dolor o c?licos en la parte baja del vientre o de la espalda. ?Event organiser pesos [employer]. ?No haga ejercicio si hace mucho calor o humedad, o si se encuentra a una altitud elevada. ?Si lo desea, puede seguir teniendo [employer], salvo que el m?dico le indique lo contrario. ?Alivio del dolor y del [place] ?Haga pausas frecuentes y descanse con las piernas levantadas (elevadas) si tiene calambres en las piernas o dolor en la parte baja de la espalda. ?Dese ba?os de asiento con agua tibia para Best boy o las molestias causadas por las hemorroides. Use una crema para las hemorroides si el m?dico la autoriza. ?Use un sujetador que le brinde buen soporte para prevenir las molestias causadas por la sensibilidad en las [place]. ?Si tiene venas varicosas: ?Use medias de compresi?n como se lo haya indicado el m?dico. ?Eleve los pies durante 15?minutos, 3 o 4?veces por d?a. ?Limite el consumo de sal en su dieta. ?Seguridad ?Hable con su m?dico antes de viajar distancias largas. ?No se d? ba?os de inmersi?n en agua caliente, ba?os turcos ni saunas. ?Use el cintur?n de seguridad en todo momento mientras conduce o va en auto. ?Hable con el m?dico si es v?ctima de Terex Corporation o f?sico. ?Preparaci?n para el nacimiento ?Para prepararse para la llegada de su beb?: ?Tome clases prenatales para entender, Psychologist, prison and probation services, y hacer preguntas sobre el [place] de parto y [place]. ?Visite el hospital y recorra el ?rea de maternidad. ?Compre un asiento de seguridad [employer] atr?s, y aseg?rese de saber c?mo instalarlo en su autom?vil. ?Prepare la habitaci?n o el lugar donde dormir? el beb?. Aseg?rese de quitar todas las almohadas y [place] de peluche de  la cuna del beb? para evitar la asfixia. ?Indicaciones generales ?Evite el contacto con las bandejas sanitarias de los gatos y la tierra que estos animales usan. Estos alimentos contienen bacterias que pueden causar defectos cong?nitos en el beb?Marland Kitchen Si tiene Research scientist (physical sciences), p?dale a alguien que limpie la caja de arena por usted. ?No se haga lavados vaginales ni use tampones. No use toallas higi?nicas perfumadas. ?No consuma ning?n producto que contenga nicotina o tabaco, como cigarrillos, cigarrillos electr?nicos y tabaco de Higher education careers adviser. Si necesita ayuda para dejar de consumir estos productos, consulte al m?dico. ?No use ning?n remedio a base de hierbas, drogas ilegales o medicamentos que no le hayan sido recetados. Las sustancias qu?micas de estos productos [place]?ar al beb?Marland Kitchen ?No beba alcohol. ?Le realizar?n ex?menes prenatales m?s frecuentes durante el tercer trimestre. Durante una visita prenatal de rutina, el m?dico le har? un examen f?sico, Teacher, early years/pre? pruebas y hablar? con usted de su salud general. Cumpla con todas las visitas de seguimiento. Esto es importante. ?D?nde buscar m?s informaci?n ?American Pregnancy Association (Asociaci?n Estadounidense del Embarazo): americanpregnancy.org ?[employer]X [employer] of Obstetricians and Gynecologists (Colegio Estadounidense de Obstetras y Ginec?logos): [email or web address].br? ?Office on [employer] ([place]): [email or web address] ?Comun?quese con un m?dico si tiene: ?[place]. ?C?licos leves en la  pelvis, presi?n en la pelvis o dolor persistente en la zona abdominal o la parte baja de la espalda. ?V?mitos o diarrea. ?Secreci?n vaginal con mal olor u orina con mal olor. ?Dolor al Continental Airlines. ?Un dolor de cabeza que no desaparece despu?s de tomar analg?sicos. ?Cambios en la visi?n o ve manchas delante de los ojos. ?Solicite ayuda de inmediato si: ?Rompe la bolsa. ?Tiene contracciones regulares separadas por menos de 5?minutos. ?Tiene sangrado o  peque?as p?rdidas vaginales. ?Siente un dolor abdominal intenso. ?Tiene dificultad para respirar. ?Siente dolor en el pecho. ?Sufre episodios de [employer]. ?No ha sentido a su beb? moverse durante el per?

## 2021-05-06 NOTE — Progress Notes (Signed)
? ?  PRENATAL VISIT NOTE ? ?Subjective:  ?Kristy Nguyen is a 31 y.o. G3P1011 at [redacted]w[redacted]d being seen today for ongoing prenatal care.  She is currently monitored for the following issues for this high-risk pregnancy and has Urinary tract infection in mother during second trimester of pregnancy; GDM (gestational diabetes mellitus); History of group B Streptococcus (GBS) infection; History of oligohydramnios in prior pregnancy, currently pregnant; Supervision of high risk pregnancy, antepartum; History of postpartum depression, currently pregnant; BMI 40.0-44.9, adult ([place]); Language barrier; Obesity in pregnancy; LGA (large for gestational age) fetus affecting management of mother; History of shoulder dystocia in prior pregnancy, currently pregnant; and Chlamydia trachomatis infection in pregnancy on their problem list. ? ?Patient reports  seen in MAU due to bleeding, no vaginal bleeding noted .  Contractions: Irritability. Vag. Bleeding: None.  Movement: Present. Denies leaking of fluid.  ? ?The following portions of the patient's history were reviewed and updated as appropriate: allergies, current medications, past family history, past medical history, past social history, past surgical history and problem list.  ? ?Objective:  ? ?Vitals:  ? 05/06/21 0832  ?BP: 103/71  ?Pulse: 93  ?Weight: 215 lb (97.5 kg)  ? ? ?Fetal Status: Fetal Heart Rate (bpm): 141 Fundal Height: 31 cm Movement: Present    ? ?General:  Alert, oriented and cooperative. Patient is in no acute distress.  ?Skin: Skin is warm and dry. No rash noted.   ?Cardiovascular: Normal heart rate noted  ?Respiratory: Normal respiratory effort, no problems with respiration noted  ?Abdomen: Soft, gravid, appropriate for gestational age.  Pain/Pressure: Present     ?Pelvic: Cervical exam deferred        ?Extremities: Normal range of motion.  Edema: None  ?Mental Status: Normal mood and affect. Normal behavior. Normal judgment and thought content.   ? ?Assessment and Plan:  ?Pregnancy: G3P1011 at [redacted]w[redacted]d ?1. Supervision of high risk pregnancy, antepartum ?Has had 28 wk labs ? ?2. Diet controlled gestational diabetes mellitus (GDM) in third trimester ?No log--reports CBGs are all elevated by 5-10 points, so added Metformin ?- metFORMIN (GLUCOPHAGE) 500 MG tablet; Take 1 tablet (500 mg total) by mouth 2 (two) times daily with a meal.  Dispense: 180 tablet; Refill: 2 ? ?3. Language barrier ?Speaks English ? ?4. History of shoulder dystocia in prior pregnancy, currently pregnant ?Infant with 3 years of PT and baby delivered at 37 wk with 7 lb 1 oz baby and 45s shoulder dystocia. Will get primary sections, though timing may be at 37 weeks given poor control ? ?5. Excessive fetal growth affecting management of pregnancy in third trimester, single or unspecified fetus ?Has f/u growth in  mid-April ? ?Preterm labor symptoms and general obstetric precautions including but not limited to vaginal bleeding, contractions, leaking of fluid and fetal movement were reviewed in detail with the patient. ?Please refer to After Visit Summary for other counseling recommendations.  ? ?Return in 2 weeks (on 05/20/2021) for [facility] week of 4/24 needs weekly BPP. ? ?Future Appointments  ?Date Time Provider [place]  ?05/10/2021  4:15 PM Junie Panning, PT [facility] None  ?05/31/2021  3:30 PM [facility] NURSE [facility]  ?05/31/2021  3:45 PM [facility] US4 [facility]  ? ? ?Donnamae Jude, MD ? ?

## 2021-05-07 ENCOUNTER — Encounter (HOSPITAL_BASED_OUTPATIENT_CLINIC_OR_DEPARTMENT_OTHER): Payer: Self-pay | Admitting: Emergency Medicine

## 2021-05-07 ENCOUNTER — Other Ambulatory Visit: Payer: Self-pay

## 2021-05-07 ENCOUNTER — Emergency Department (HOSPITAL_BASED_OUTPATIENT_CLINIC_OR_DEPARTMENT_OTHER)
Admission: EM | Admit: 2021-05-07 | Discharge: 2021-05-08 | Disposition: A | Discharge status: Home / Self Care | Payer: BC Managed Care – PPO | Attending: Emergency Medicine | Admitting: Emergency Medicine

## 2021-05-07 DIAGNOSIS — Z7982 Long term (current) use of aspirin: Secondary | ICD-10-CM | POA: Insufficient documentation

## 2021-05-07 DIAGNOSIS — Z7984 Long term (current) use of oral hypoglycemic drugs: Secondary | ICD-10-CM | POA: Diagnosis not present

## 2021-05-07 DIAGNOSIS — R1013 Epigastric pain: Secondary | ICD-10-CM | POA: Insufficient documentation

## 2021-05-07 DIAGNOSIS — R100 Acute abdomen: Secondary | ICD-10-CM | POA: Diagnosis not present

## 2021-05-07 DIAGNOSIS — O99613 Diseases of the digestive system complicating pregnancy, third trimester: Secondary | ICD-10-CM | POA: Diagnosis not present

## 2021-05-07 DIAGNOSIS — R109 Unspecified abdominal pain: Secondary | ICD-10-CM

## 2021-05-07 DIAGNOSIS — O4690 Antepartum hemorrhage, unspecified, unspecified trimester: Secondary | ICD-10-CM | POA: Diagnosis not present

## 2021-05-07 DIAGNOSIS — E119 Type 2 diabetes mellitus without complications: Secondary | ICD-10-CM | POA: Insufficient documentation

## 2021-05-07 DIAGNOSIS — R112 Nausea with vomiting, unspecified: Secondary | ICD-10-CM | POA: Diagnosis not present

## 2021-05-07 DIAGNOSIS — Z91040 Latex allergy status: Secondary | ICD-10-CM | POA: Diagnosis not present

## 2021-05-07 LAB — COMPREHENSIVE METABOLIC PANEL
ALT: 12 U/L (ref 0–44)
AST: 32 U/L (ref 15–41)
Albumin: 3 g/dL — ABNORMAL LOW (ref 3.5–5.0)
Alkaline Phosphatase: 83 U/L (ref 38–126)
Anion gap: 10 (ref 5–15)
BUN: 7 mg/dL (ref 6–20)
CO2: 20 mmol/L — ABNORMAL LOW (ref 22–32)
Calcium: 8.9 mg/dL (ref 8.9–10.3)
Chloride: 103 mmol/L (ref 98–111)
Creatinine, Ser: 0.45 mg/dL (ref 0.44–1.00)
GFR, Estimated: >60 mL/min (ref >60)
Glucose, Bld: 93 mg/dL (ref 70–99)
Potassium: 4.4 mmol/L (ref 3.5–5.1)
Sodium: 133 mmol/L — ABNORMAL LOW (ref 135–145)
Total Bilirubin: 1.1 mg/dL (ref 0.3–1.2)
Total Protein: 6.9 g/dL (ref 6.5–8.1)

## 2021-05-07 LAB — CBC WITH DIFFERENTIAL/PLATELET
Abs Immature Granulocytes: 0.13 10*3/uL — ABNORMAL HIGH (ref 0.00–0.07)
Basophils Absolute: 0 10*3/uL (ref 0.0–0.1)
Basophils Relative: 0 %
Eosinophils Absolute: 0.1 10*3/uL (ref 0.0–0.5)
Eosinophils Relative: 1 %
HCT: 33.7 % — ABNORMAL LOW (ref 36.0–46.0)
Hemoglobin: 11.2 g/dL — ABNORMAL LOW (ref 12.0–15.0)
Immature Granulocytes: 1 %
Lymphocytes Relative: 12 %
Lymphs Abs: 1.2 10*3/uL (ref 0.7–4.0)
MCH: 29.7 pg (ref 26.0–34.0)
MCHC: 33.2 g/dL (ref 30.0–36.0)
MCV: 89.4 fL (ref 80.0–100.0)
Monocytes Absolute: 0.5 10*3/uL (ref 0.1–1.0)
Monocytes Relative: 5 %
Neutro Abs: 8.4 10*3/uL — ABNORMAL HIGH (ref 1.7–7.7)
Neutrophils Relative %: 81 %
Platelets: 292 10*3/uL (ref 150–400)
RBC: 3.77 MIL/uL — ABNORMAL LOW (ref 3.87–5.11)
RDW: 13.2 % (ref 11.5–15.5)
WBC: 10.4 10*3/uL (ref 4.0–10.5)
nRBC: 0 % (ref 0.0–0.2)

## 2021-05-07 LAB — LIPASE, BLOOD: Lipase: 25 U/L (ref 11–51)

## 2021-05-07 MED ORDER — ACETAMINOPHEN 325 MG PO TABS
650.0000 mg | ORAL_TABLET | Freq: Once | ORAL | Status: DC
  Filled 2021-05-07: qty 2

## 2021-05-07 MED ORDER — SODIUM CHLORIDE 0.9 % IV BOLUS
1000.0000 mL | Freq: Once | INTRAVENOUS | Status: AC
  Administered 2021-05-07: 1000 mL via INTRAVENOUS

## 2021-05-07 MED ORDER — METOCLOPRAMIDE HCL 5 MG/ML IJ SOLN
10.0000 mg | Freq: Once | INTRAMUSCULAR | Status: AC
  Administered 2021-05-07: 10 mg via INTRAVENOUS
  Filled 2021-05-07: qty 2

## 2021-05-07 MED ORDER — SODIUM CHLORIDE 0.9 % IV BOLUS
1000.0000 mL | Freq: Once | INTRAVENOUS | Status: AC
  Administered 2021-05-08: 1000 mL via INTRAVENOUS

## 2021-05-07 NOTE — Discharge Instructions (Addendum)
Your work-up today did not show any concerning findings as a cause of your abdominal pain.  Your blood work was reassuring.  Your heart rate was elevated.  However you stated you have not drink much water in the past couple days especially today.  You received 2 L of fluid in the emergency room.  You also received a dose of Reglan.  You stated improvement in your symptoms prior to leaving.  You are also evaluated by fetal heart monitor which was reviewed by OB.  Everything from an OB standpoint was reassuring today as well.  Recommend you follow-up with OB.  In the future if you have any issues while being pregnant please go to MAU or [facility] emergency room. ?

## 2021-05-07 NOTE — ED Provider Notes (Signed)
?[facility] EMERGENCY DEPARTMENT ?Provider Note ? ? ?CSN: [id number] ?Arrival date & time: 05/07/21  2144 ? ?  ? ?History ? ?Chief Complaint  ?Patient presents with  ? Abdominal Pain  ? ? ?Kristy Nguyen is a 31 y.o. female. ? ?31 year old female who is G3, P1 A1 presents today for evaluation of epigastric abdominal pain onset this morning associated with nausea and vomiting.  She denies fever, chills, urinary frequency, changes to her bowel habits.  Describes the pain as pressure.  Denies similar pain in the past.  Denies contractions.  Denies any vaginal discharge, or vaginal bleeding.  Took Unisom and B6 prior to arrival without improvement in her symptoms.  Has not tried anything else prior to arrival. ? ?The history is provided by the patient. No language interpreter was used.  ? ?  ? ?Home Medications ?Prior to Admission medications   ?Medication Sig Start Date End Date Taking? Authorizing Provider  ?Accu-Chek Softclix Lancets lancets Use as instructed 03/01/21   Aletha Halim, MD  ?aspirin EC 81 MG tablet Take 1 tablet (81 mg total) by mouth daily. 03/01/21   Aletha Halim, MD  ?Blood Glucose Monitoring Suppl (ACCU-CHEK GUIDE) w/Device KIT 1 Device by Does not apply route in the morning, at noon, in the evening, and at bedtime. 03/01/21   Aletha Halim, MD  ?calcium carbonate (TUMS) 500 MG chewable tablet Chew 1 tablet (200 mg of elemental calcium total) by mouth daily. 04/19/21 05/19/21  Darlina Rumpf, CNM  ?cefadroxil (DURICEF) 500 MG capsule Take 1 capsule (500 mg total) by mouth 2 (two) times daily. 05/05/21   Darlina Rumpf, CNM  ?cyclobenzaprine (FLEXERIL) 5 MG tablet Take 1 tablet (5 mg total) by mouth 3 (three) times daily as needed for muscle spasms. 03/10/21   Renard Matter, MD  ?docusate sodium (COLACE) 100 MG capsule Take 1 capsule (100 mg total) by mouth 2 (two) times daily as needed. 04/29/21   Leftwich-Kirby, Kathie Dike, CNM  ?docusate sodium (COLACE) 100 MG capsule Take 1  capsule (100 mg total) by mouth 2 (two) times daily as needed. 05/05/21   Darlina Rumpf, CNM  ?doxylamine, Sleep, (UNISOM) 25 MG tablet Take 25 mg by mouth at bedtime as needed.    [provider]  ?famotidine (PEPCID) 20 MG tablet Take 1 tablet (20 mg total) by mouth at bedtime. 04/16/21   Gabriel Carina, CNM  ?glucose blood test strip Use as instructed 03/01/21   Aletha Halim, MD  ?metFORMIN (GLUCOPHAGE) 500 MG tablet Take 1 tablet (500 mg total) by mouth 2 (two) times daily with a meal. 05/06/21   Donnamae Jude, MD  ?polyethylene glycol powder (GLYCOLAX/MIRALAX) 17 GM/SCOOP powder Take 17 g by mouth daily. 04/29/21   Leftwich-Kirby, Kathie Dike, CNM  ?Prenatal Vit-Fe Fumarate-FA (PRENATAL PO) Take by mouth.    [provider]  ?promethazine (PHENERGAN) 25 MG tablet Take 1 tablet (25 mg total) by mouth every 6 (six) hours as needed for nausea or vomiting. 04/28/21   Clarnce Flock, MD  ?pyridoxine (B-6) 100 MG tablet Take 100 mg by mouth daily.    [provider]  ?   ? ?Allergies    ?Fish allergy, Latex, and Penicillins   ? ?Review of Systems   ?Review of Systems  ?Constitutional:  Negative for activity change, chills and fever.  ?Respiratory:  Negative for shortness of breath.   ?Cardiovascular:  Negative for chest pain.  ?Gastrointestinal:  Positive for abdominal pain, nausea  and vomiting.  ?Genitourinary:  Negative for dysuria, vaginal bleeding and vaginal discharge.  ?All other systems reviewed and are negative. ? ?Physical Exam ?Updated Vital Signs ?BP 119/86   Pulse (!) 128   Temp 99 ?F (37.2 ?C) (Oral)   LMP 10/16/2020   SpO2 96%  ?Physical Exam ?Vitals and nursing note reviewed.  ?Constitutional:   ?   General: She is not in acute distress. ?   Appearance: Normal appearance. She is not ill-appearing.  ?HENT:  ?   Head: Normocephalic and atraumatic.  ?   Nose: Nose normal.  ?Eyes:  ?   General: No scleral icterus. ?   Extraocular Movements: Extraocular movements  intact.  ?   Conjunctiva/sclera: Conjunctivae normal.  ?Cardiovascular:  ?   Rate and Rhythm: Normal rate and regular rhythm.  ?   Pulses: Normal pulses.  ?   Heart sounds: Normal heart sounds.  ?Pulmonary:  ?   Effort: Pulmonary effort is normal. No respiratory distress.  ?   Breath sounds: Normal breath sounds. No wheezing or rales.  ?Abdominal:  ?   General: There is no distension.  ?   Tenderness: There is no abdominal tenderness. There is no right CVA tenderness, left CVA tenderness or guarding.  ?Musculoskeletal:     ?   General: Normal range of motion.  ?   Cervical back: Normal range of motion.  ?Skin: ?   General: Skin is warm and dry.  ?Neurological:  ?   General: No focal deficit present.  ?   Mental Status: She is alert. Mental status is at baseline.  ? ? ?ED Results / Procedures / Treatments   ?Labs ?(all labs ordered are listed, but only abnormal results are displayed) ?Labs Reviewed  ?COMPREHENSIVE METABOLIC PANEL  ?LIPASE, BLOOD  ?URINALYSIS, ROUTINE W REFLEX MICROSCOPIC  ?CBC WITH DIFFERENTIAL/PLATELET  ? ? ?EKG ?None ? ?Radiology ?No results found. ? ?Procedures ?Procedures  ? ? ?Medications Ordered in ED ?Medications  ?sodium chloride 0.9 % bolus 1,000 mL (has no administration in time range)  ? ? ?ED Course/ Medical Decision Making/ A&P ?  ?                        ?Medical Decision Making ?Amount and/or Complexity of Data Reviewed ?Labs: ordered. ? ?Risk ?OTC drugs. ?Prescription drug management. ? ? ?Medical Decision Making / ED Course ? ? ?This patient presents to the ED for concern of abdominal pain in setting of pregnancy, this involves an extensive number of treatment options, and is a complaint that carries with it a high risk of complications and morbidity.  The differential diagnosis includes Maxidex contractions, true contractions, cholecystitis, pancreatitis ? ?MDM: ?31 year old female presents today for evaluation of abdominal pain in setting of pregnancy.  She is G3P1A1.  Patient has  had multiple presentations to this emergency room in the recent past.  Recently for abdominal pain on 3/20.  States this does not feel similar to that episode.  She is without fever.  She is tachycardic on arrival at 128.  Looking at her previous visit she was tachycardic then as well.  Will provide with IV hydration, obtain basic labs.  Will be reviewed her FHT which was reassuring and she has been cleared by OB perspective.  We will provide IV Reglan, and Tylenol. ?Patient on reevaluation states she is feeling better.  IV Reglan that she has been administered.  She is almost through with her first bag of fluids.  Heart rate improved to 111.  Patient will provide a urine sample now.  She denies dysuria.  ?UA without UTI.  It is nitrite positive with many bacteria however without WBCs.  Patient reports improvement in symptoms.  Patient is appropriate for discharge.  Will complete the remaining fluid bolus prior to discharge.  Patient's heart rate improved to 110.  Symptomatic treatment discussed.  Discussed importance of follow-up with OB.  Return precautions discussed.  Patient voices understanding and is in agreement with plan. ? ? ?Lab Tests: ?-I ordered, reviewed, and interpreted labs.   ?The pertinent results include:   ?Labs Reviewed  ?COMPREHENSIVE METABOLIC PANEL  ?LIPASE, BLOOD  ?URINALYSIS, ROUTINE W REFLEX MICROSCOPIC  ?CBC WITH DIFFERENTIAL/PLATELET  ?  ? ? ?EKG ? EKG Interpretation ? ?Date/Time:    ?Ventricular Rate:    ?PR Interval:    ?QRS Duration:   ?QT Interval:    ?QTC Calculation:   ?R Axis:     ?Text Interpretation:   ?  ? ?  ? ? ?Medicines ordered and prescription drug management: ?Meds ordered this encounter  ?Medications  ? sodium chloride 0.9 % bolus 1,000 mL  ? metoCLOPramide (REGLAN) injection 10 mg  ? acetaminophen (TYLENOL) tablet 650 mg  ? sodium chloride 0.9 % bolus 1,000 mL  ?  ?-I have reviewed the patients home medicines and have made adjustments as needed ? ?Cardiac  Monitoring: ?The patient was maintained on a cardiac monitor.  I personally viewed and interpreted the cardiac monitored which showed an underlying rhythm of: Sinus tachycardia ? ?Reevaluation: ?After the interventions noted

## 2021-05-07 NOTE — ED Notes (Signed)
Spoke with Kristine Garbe, [facility] RN again - states pt is cleared obstetrically and OB recommends workup for pancreatitis and gallbladder. States pt can be removed from [place] monitor at this time. Amjad, PA made aware.   ?

## 2021-05-07 NOTE — Progress Notes (Signed)
Dr. Para March notified of patient at [facility] ED. Pt recently seen in office on 05/06/21 for prenatal care. Orders placed for metformin for GDM and treatment for current UTI per Dr. Para March.   ?Fetal tracing appropriate and reactive for gestational age. No uterine activity noted.   ?Pt cleared obstetrically.  Recommendations made for pancreatitis and gallbladder work up.  ?Shanda Bumps, RN at [facility] made aware at 2225. Pt may received fluids and all nausea medications are safe for administration as needed at this gestation in pregnancy.  [facility] ED RN states no further questions, care resumed by medical staff.  ? ? ?

## 2021-05-07 NOTE — ED Triage Notes (Signed)
Pt [redacted] weeks pregnant c/o epigastric pain since this morning. Unable to tolerate PO intake.  ?

## 2021-05-07 NOTE — ED Notes (Addendum)
Spoke with Melton Alar, [facility] OBRN. ? ?Pt placed on toco monitor. FHR 155-165. Pt denies vaginal discharge/bleeding and uterine contractions.  Endorses positive FM.  ? ?ROB states they will watch baby on monitor and speak with OB and call back with recommendations. ?

## 2021-05-07 NOTE — Progress Notes (Signed)
Call received from [place], RN at [facility] ED with G3P1 patient presenting at 29 wks with rt epigastric pain since this morning, and stated that she "can't keep anything down."  Pt is afebrile, HR 130's.  Denies LOF, vaginal bleeding, and denies uc's.  Pt endorses positive FM. Care received by [facility] [employer]. ?Pt connected to EFM at 2204.  [facility] ED RN notified that [facility] would monitor fetal tracing remotely and contact faculty practice.   ?

## 2021-05-08 LAB — URINALYSIS, MICROSCOPIC (REFLEX)

## 2021-05-08 LAB — CULTURE, OB URINE: Culture: >=100000 — AB

## 2021-05-08 LAB — URINALYSIS, ROUTINE W REFLEX MICROSCOPIC
Glucose, UA: NEGATIVE mg/dL
Hgb urine dipstick: NEGATIVE
Ketones, ur: >=80 mg/dL — AB
Leukocytes,Ua: NEGATIVE
Nitrite: POSITIVE — AB
Protein, ur: NEGATIVE mg/dL
Specific Gravity, Urine: 1.025 (ref 1.005–1.030)
pH: 6 (ref 5.0–8.0)

## 2021-05-10 ENCOUNTER — Encounter: Payer: Self-pay | Admitting: Physical Therapy

## 2021-05-16 NOTE — Progress Notes (Addendum)
? ?PRENATAL VISIT NOTE ? ?Subjective:  ?Kristy Nguyen is a 31 y.o. G3P1011 at [redacted]w[redacted]d being seen today for ongoing prenatal care.  She is currently monitored for the following issues for this high-risk pregnancy and has Urinary tract infection in mother during second trimester of pregnancy; GDM (gestational diabetes mellitus); History of group B Streptococcus (GBS) infection; History of oligohydramnios in prior pregnancy, currently pregnant; Supervision of high risk pregnancy, antepartum; History of postpartum depression, currently pregnant; BMI 40.0-44.9, adult ([place]); Language barrier; Obesity in pregnancy; LGA (large for gestational age) fetus affecting management of mother; History of shoulder dystocia in prior pregnancy, currently pregnant; and Chlamydia trachomatis infection in pregnancy on their problem list. ? ?Patient reports  heart racing when she moves but rest improves .  Contractions: Irritability. Vag. Bleeding: None.  Movement: Present. Denies leaking of fluid.  ? ?The following portions of the patient's history were reviewed and updated as appropriate: allergies, current medications, past family history, past medical history, past social history, past surgical history and problem list.  ? ?Objective:  ? ?Vitals:  ? 05/18/21 0838  ?BP: 113/77  ?Pulse: (!) 108  ?Weight: 215 lb (97.5 kg)  ? ? ?Fetal Status: Fetal Heart Rate (bpm): 132 Fundal Height: 33 cm Movement: Present    ? ?General:  Alert, oriented and cooperative. Patient is in no acute distress.  ?Skin: Skin is warm and dry. No rash noted.   ?Cardiovascular: Normal heart rate noted  ?Respiratory: Normal respiratory effort, no problems with respiration noted  ?Abdomen: Soft, gravid, appropriate for gestational age.  Pain/Pressure: Present     ?Pelvic: Cervical exam performed in the presence of a chaperone        ?Extremities: Normal range of motion.  Edema: Trace  ?Mental Status: Normal mood and affect. Normal behavior. Normal judgment and  thought content.  ? ?Assessment and Plan:  ?Pregnancy: G3P1011 at [redacted]w[redacted]d ?1. Chlamydia infection affecting pregnancy, antepartum ?- TOC today  ? ?2. Gestational diabetes mellitus (GDM) in third trimester ?- Did not realize MTF was sent in  ? ?3. Urinary tract infection in mother during second trimester of pregnancy ?- Recurrent ESBL UTI since 2/17. Prior UTI was proteus. E.coli is sensitive to macrobid. Will do suppression with this.  ? ?4. History of group B Streptococcus (GBS) infection ?- PCN in labor ? ?5. History of oligohydramnios in prior pregnancy, currently pregnant ?Normal AFI on 3/22 ? ?6. Supervision of high risk pregnancy, antepartum ?- Encouraged compliance with routine PNC and use MAU for emergencies.  ?- Will have her do referral to cards OB for heart racing.  ?- Some LOF - ruled out rupture today. Pool/fern negative.  ? ?7. Language barrier ?- Spanish interpreter used throughout appt ? ?8. History of shoulder dystocia in prior pregnancy, currently pregnant ?- Growth on 3/14 was 91%ile, AC 95%ile. Next growth is 4/17.  ?- Due to risk of birth injury requiring PT for 3 years following prior delivery and trend toward poor control with this delivery. Prior delivery note reviewed and shoulder dystocia did resolved with McRobert's and Suprapubic and baby was noted to have "poor tone at delivery".  She was 37 weeks at delivery, 3215g and IOL for oligo in s/o GDMA2. I would recommend delivery via pLTCS. Pt agrees and was hoping for this as well.  ? ?9. Excessive fetal growth affecting management of pregnancy in third trimester, single or unspecified fetus ? ? ?Preterm labor symptoms and general obstetric precautions including but not limited to vaginal bleeding,  contractions, leaking of fluid and fetal movement were reviewed in detail with the patient. ?Please refer to After Visit Summary for other counseling recommendations.  ? ?Return in about 2 weeks (around 06/01/2021) for OB VISIT, MD or APP. ? ?Future  Appointments  ?Date Time Provider [place]  ?05/31/2021  3:30 PM [facility]-M[facility] NURSE [facility]  ?05/31/2021  3:45 PM W[facility]-M[facility] US4 WMC-MFCUS WMC  ?06/07/2021  3:15 PM WMC-WOCA NST WMC-CWH WMC  ?06/14/2021  3:15 PM WMC-WOCA NST WMC-CWH WMC  ?06/21/2021  3:15 PM WMC-WOCA NST W[facility]-CWH WMC  ?06/28/2021  3:15 PM WMC-WOCA NST WMC-CWH [place]  ?07/05/2021  3:15 PM WMC-WOCA NST WMC-CWH WMC  ?07/14/2021  3:15 PM WMC-WOCA NST WMC-CWH [place]  ?07/19/2021  3:15 PM WMC-WOCA NST W[facility]-CWH WMC  ?07/26/2021  3:15 PM WMC-WOCA NST WMC-CWH WMC  ? ? ?Radene Gunning, MD ?

## 2021-05-18 ENCOUNTER — Encounter: Payer: Self-pay | Admitting: Obstetrics and Gynecology

## 2021-05-18 ENCOUNTER — Other Ambulatory Visit (HOSPITAL_COMMUNITY)
Admission: RE | Admit: 2021-05-18 | Discharge: 2021-05-18 | Disposition: A | Discharge status: Home / Self Care | Payer: BC Managed Care – PPO | Source: Ambulatory Visit | Attending: Obstetrics and Gynecology | Admitting: Obstetrics and Gynecology

## 2021-05-18 ENCOUNTER — Encounter: Payer: Self-pay | Admitting: Family Medicine

## 2021-05-18 ENCOUNTER — Ambulatory Visit (INDEPENDENT_AMBULATORY_CARE_PROVIDER_SITE_OTHER): Payer: BC Managed Care – PPO | Admitting: Obstetrics and Gynecology

## 2021-05-18 VITALS — BP 113/77 | HR 108 | Wt 215.0 lb

## 2021-05-18 DIAGNOSIS — O09299 Supervision of pregnancy with other poor reproductive or obstetric history, unspecified trimester: Secondary | ICD-10-CM

## 2021-05-18 DIAGNOSIS — A749 Chlamydial infection, unspecified: Secondary | ICD-10-CM

## 2021-05-18 DIAGNOSIS — O2342 Unspecified infection of urinary tract in pregnancy, second trimester: Secondary | ICD-10-CM

## 2021-05-18 DIAGNOSIS — O98819 Other maternal infectious and parasitic diseases complicating pregnancy, unspecified trimester: Secondary | ICD-10-CM

## 2021-05-18 DIAGNOSIS — Z789 Other specified health status: Secondary | ICD-10-CM

## 2021-05-18 DIAGNOSIS — O0990 Supervision of high risk pregnancy, unspecified, unspecified trimester: Secondary | ICD-10-CM

## 2021-05-18 DIAGNOSIS — O24410 Gestational diabetes mellitus in pregnancy, diet controlled: Secondary | ICD-10-CM

## 2021-05-18 DIAGNOSIS — O3663X0 Maternal care for excessive fetal growth, third trimester, not applicable or unspecified: Secondary | ICD-10-CM

## 2021-05-18 DIAGNOSIS — A568 Sexually transmitted chlamydial infection of other sites: Secondary | ICD-10-CM

## 2021-05-18 DIAGNOSIS — O98313 Other infections with a predominantly sexual mode of transmission complicating pregnancy, third trimester: Secondary | ICD-10-CM

## 2021-05-18 DIAGNOSIS — Z8619 Personal history of other infectious and parasitic diseases: Secondary | ICD-10-CM

## 2021-05-18 MED ORDER — NITROFURANTOIN MONOHYD MACRO 100 MG PO CAPS: 100.0000 mg | ORAL_CAPSULE | Freq: Every day | ORAL | 3 refills | Status: DC

## 2021-05-19 LAB — GC/CHLAMYDIA PROBE AMP (~~LOC~~) NOT AT ARMC
Chlamydia: NEGATIVE
Comment: NEGATIVE
Comment: NORMAL
Neisseria Gonorrhea: NEGATIVE

## 2021-05-20 NOTE — Progress Notes (Signed)
?[facility] ? ?New Evaluation ? ?Date:  05/21/2021  ? ?ID:  Kristy Nguyen, DOB [birth year], MRN [id number] ? ?PCP:  Pcp, No ?  ?[facility] Providers ?Cardiologist:  None  ?Electrophysiologist:  None      ? ?Referring MD: Radene Gunning, MD  ? ?Chief Complaint: Palpitations ? ?History of Present Illness:   ? ?Kristy Nguyen is a 31 y.o. female [[id number]] who is being seen today for the evaluation of palpitations at the request of Radene Gunning, MD.  ? ?Has history of gestational diabetes, obesity, oligohydramnios in prior pregnancy.  ? ?The patient is currently [redacted] weeks pregnant. She states that about 2 weeks ago she developed acute onset of palpitations, diaphoresis and SOB. Symptoms lasted for several hours before resolving. Since that time, she has continued to have intermittent episodes of palpitations.  Symptoms improve with relaxing and laying down and are worsened with activity. Reports some occasional lightheadedness. No chest pain, orthopnea, PND, or SOB. BG have been running high but no episodes of hypoglycemia.  ? ?Of note, she states that stress and anxiety may be contributing to her symptoms as she found her tenant deceased in the house.  ? ?Prior CV Studies Reviewed: ?The following studies were reviewed today: ?No CV procedure.  ? ?Past Medical History:  ?Diagnosis Date  ? Allergy   ? Diabetes mellitus without complication ([place])   ? gestational  ? Gestational diabetes   ? Nexplanon insertion 10/28/2016  ? NSVD (normal spontaneous vaginal delivery) 09/17/2016  ? Supervision of high-risk pregnancy 04/05/2016  ?  Clinic [facility] Prenatal Labs  Dating lmp and 12.4 week [place] Blood type: --/--/O POS (02/10 1138)   Genetic Screen 1? Screen:    AFP:     Quad:     NIPS: Antibody: negative  Anatomic [place]  Normal, anterior placenta, female  Rubella:  immune  GTT Early:               Third trimester:  Glucose, Fasting 65 - 91 mg/dL 95    Glucose, 1 hour 65 - 179 mg/dL 192    Glucose, 2 hour 65 - 152  mg/dL 150     RPR:   nega  ? ? ?Past Surgical History:  ?Procedure Laterality Date  ? DILATION AND EVACUATION  2010  ?   ? ?OB History   ? ? Gravida  ?3  ? Para  ?1  ? Term  ?1  ? Preterm  ?0  ? AB  ?1  ? Living  ?1  ?  ? ? SAB  ?1  ? IAB  ?0  ? Ectopic  ?0  ? Multiple  ?   ? Live Births  ?1  ?   ?  ?  ?    ? ? ?Current Medications: ?Current Meds  ?Medication Sig  ? Accu-Chek Softclix Lancets lancets Use as instructed  ? aspirin EC 81 MG tablet Take 1 tablet (81 mg total) by mouth daily.  ? Blood Glucose Monitoring Suppl (ACCU-CHEK GUIDE) w/Device KIT 1 Device by Does not apply route in the morning, at noon, in the evening, and at bedtime.  ? cyclobenzaprine (FLEXERIL) 5 MG tablet Take 1 tablet (5 mg total) by mouth 3 (three) times daily as needed for muscle spasms.  ? doxylamine, Sleep, (UNISOM) 25 MG tablet Take 25 mg by mouth at bedtime as needed.  ? glucose blood test strip Use as instructed  ? metFORMIN (GLUCOPHAGE) 500 MG tablet Take 1 tablet (500  mg total) by mouth 2 (two) times daily with a meal.  ? nitrofurantoin, macrocrystal-monohydrate, (MACROBID) 100 MG capsule Take 1 capsule (100 mg total) by mouth at bedtime.  ? Prenatal Vit-Fe Fumarate-FA (PRENATAL PO) Take by mouth.  ? pyridoxine (B-6) 100 MG tablet Take 100 mg by mouth daily.  ?  ? ?Allergies:   Fish allergy, Latex, and Penicillins  ? ?Social History  ? ?Socioeconomic History  ? Marital status: Single  ?  Spouse name: Not on file  ? Number of children: Not on file  ? Years of education: Not on file  ? Highest education level: 9th grade  ?Occupational History  ? 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  Comment: tshirts and accessories  ?Tobacco Use  ? Smoking status: Former  ?  Packs/day: 0.25  ?  Types: Cigarettes  ? Smokeless tobacco: Former  ?Vaping Use  ? Vaping Use: Never used  ?Substance and Sexual Activity  ? Alcohol use: No  ? Drug use: No  ? Sexual activity: Not Currently  ?  Partners: Male  ?  Birth control/protection: None  ?Other  Topics Concern  ? Not on file  ?Social History Narrative  ? ** Merged History Encounter **  ?    ? Lives with her boyfriend and infant son.  ? ?Social Determinants of Health  ? ?Financial Resource Strain: Not on file  ?Food Insecurity: Food Insecurity Present  ? Worried About Charity fundraiser in the Last Year: Sometimes true  ? Ran Out of Food in the Last Year: Sometimes true  ?Transportation Needs: No Transportation Needs  ? Lack of Transportation (Medical): No  ? Lack of Transportation (Non-Medical): No  ?Physical Activity: Not on file  ?Stress: Not on file  ?Social Connections: Not on file  ?  ? ? ?Family History  ?Problem Relation Age of Onset  ? Hypertension Mother   ? Diabetes Father   ? Heart Problems Father   ? Anxiety disorder Sister   ? Depression Sister   ? Cancer Paternal Uncle   ?     Brain  ? Diabetes Maternal Grandmother   ? Diabetes Maternal Grandfather   ? Diabetes Paternal Grandmother   ? Diabetes Paternal Grandfather   ?   ? ?ROS:   ?Please see the history of present illness.    ?Review of Systems  ?Constitutional:  Positive for malaise/fatigue.  ?Respiratory:  Positive for shortness of breath.   ?Cardiovascular:  Positive for palpitations. Negative for chest pain, orthopnea, claudication, leg swelling and PND.  ?Gastrointestinal:  Negative for blood in stool and melena.  ?Genitourinary:  Negative for hematuria.  ?Neurological:  Positive for dizziness. Negative for loss of consciousness.   ? ? ?Labs/EKG Reviewed:   ? ?EKG:   ?ECG 05/07/21: sinus tachycardia with HR 128 ? ?Recent Labs: ?05/07/2021: ALT 12; BUN 7; Creatinine, Ser 0.45; Hemoglobin 11.2; Platelets 292; Potassium 4.4; Sodium 133  ? ?Recent Lipid Panel ?No results found for: CHOL, TRIG, HDL, CHOLHDL, LDLCALC, LDLDIRECT ? ?Physical Exam:   ? ?VS:  BP 122/74   Pulse (!) 115   Ht 5' (1.524 m)   Wt 217 lb (98.4 kg)   LMP 10/16/2020   SpO2 95%   BMI 42.38 kg/m?    ? ?Wt Readings from Last 3 Encounters:  ?05/21/21 217 lb (98.4 kg)   ?05/18/21 215 lb (97.5 kg)  ?05/06/21 215 lb (97.5 kg)  ?  ? ?GEN:  Well nourished, well developed in no acute distress ?HEENT: Normal ?  NECK: No JVD; No carotid bruits ?CARDIAC: Tachycardic, regular, soft systolic murmur. No rubs, gallops ?RESPIRATORY:  Clear to auscultation without rales, wheezing or rhonchi  ?ABDOMEN: Soft, non-tender, non-distended ?MUSCULOSKELETAL:  No edema; No deformity  ?SKIN: Warm and dry ?NEUROLOGIC:  Alert and oriented x 3 ?PSYCHIATRIC:  Normal affect  ? ? ?Risk Assessment/Risk Calculators:   ? ?CARPREG II ?Risk Prediction Index Score:  1.  The patient's risk for a primary cardiac event is 5%. ?{ ? ?   ?ASSESSMENT & PLAN:   ? ?#Palpitations: ?Patient presents with several week history of intermittent palpitations that last for about an hour before resolving. Reports associated SOB and diaphoresis. No syncope. Denies episodes of hypoglycemia and BP is well controlled. ECG reviewed and demonstrates sinus tachycardia. Will check zio monitor and TTE for further evaluation. ?-Check zio monitor ?-Check TTE ?-Continue hydration ? ?#Gestational DMII: ?Higher risk pregnancy. Following with OB. Recommended to start metformin. ? ?Patient Instructions  ?Medication Instructions:  ? ?Your physician recommends that you continue on your current medications as directed. Please refer to the Current Medication list given to you today. ? ?*If you need a refill on your cardiac medications before your next appointment, please call your pharmacy* ? ? ?Testing/Procedures: ? ?Your physician has requested that you have an echocardiogram. Echocardiography is a painless test that uses sound waves to create images of your heart. It provides your doctor with information about the size and shape of your heart and how well your heart?s chambers and valves are working. This procedure takes approximately one hour. There are no restrictions for this procedure. Cardiac-OB patient: to be performed by [place] or [place].   ? ? ?ZIO XT- Long Term Monitor Instructions ? ?Your physician has requested you wear a ZIO patch monitor for 3 days.  ?This is a single patch monitor. [employer] supplies one patch monitor per enrollment. Additional ?

## 2021-05-21 ENCOUNTER — Ambulatory Visit: Payer: BC Managed Care – PPO

## 2021-05-21 ENCOUNTER — Encounter: Payer: Self-pay | Admitting: Cardiology

## 2021-05-21 ENCOUNTER — Ambulatory Visit (INDEPENDENT_AMBULATORY_CARE_PROVIDER_SITE_OTHER): Payer: BC Managed Care – PPO | Admitting: Cardiology

## 2021-05-21 ENCOUNTER — Encounter: Payer: Self-pay | Admitting: *Deleted

## 2021-05-21 VITALS — BP 122/74 | HR 115 | Ht 60.0 in | Wt 217.0 lb

## 2021-05-21 DIAGNOSIS — R002 Palpitations: Secondary | ICD-10-CM

## 2021-05-21 DIAGNOSIS — O24419 Gestational diabetes mellitus in pregnancy, unspecified control: Secondary | ICD-10-CM | POA: Diagnosis not present

## 2021-05-21 NOTE — Patient Instructions (Signed)
Medication Instructions:  ? ?Your physician recommends that you continue on your current medications as directed. Please refer to the Current Medication list given to you today. ? ?*If you need a refill on your cardiac medications before your next appointment, please call your pharmacy* ? ? ?Testing/Procedures: ? ?Your physician has requested that you have an echocardiogram. Echocardiography is a painless test that uses sound waves to create images of your heart. It provides your doctor with information about the size and shape of your heart and how well your heart?s chambers and valves are working. This procedure takes approximately one hour. There are no restrictions for this procedure. Cardiac-OB patient: to be performed by [place] or [place].  ? ? ?ZIO XT- Long Term Monitor Instructions ? ?Your physician has requested you wear a ZIO patch monitor for 3 days.  ?This is a single patch monitor. [employer] supplies one patch monitor per enrollment. Additional ?stickers are not available. Please do not apply patch if you will be having a Nuclear Stress Test,  ?Echocardiogram, Cardiac CT, MRI, or Chest Xray during the period you would be wearing the  ?monitor. The patch cannot be worn during these tests. You cannot remove and re-apply the  ?ZIO XT patch monitor.  ?Your ZIO patch monitor will be mailed 3 day USPS to your address on file. It may take 3-5 days  ?to receive your monitor after you have been enrolled.  ?Once you have received your monitor, please review the enclosed instructions. Your monitor  ?has already been registered assigning a specific monitor serial # to you. ? ?Billing and Patient Assistance Program Information ? ?We have supplied [employer] with any of your insurance information on file for billing purposes. ?[employer] offers a sliding scale Patient Assistance Program for patients that do not have  ?insurance, or whose insurance does not completely cover the cost of the ZIO monitor.  ?You must apply for  the Patient Assistance Program to qualify for this discounted rate.  ?To apply, please call [employer] at [phone], select option 4, select option 2, ask to apply for  ?Patient Assistance Program. Theodore Demark will ask your household income, and how many people  ?are in your household. They will quote your out-of-pocket cost based on that information.  ?[employer] will also be able to set up a 57-month interest-free payment plan if needed. ? ?Applying the monitor ?  ?Shave hair from upper left chest.  ?Hold abrader disc by orange tab. Rub abrader in 40 strokes over the upper left chest as  ?indicated in your monitor instructions.  ?Clean area with 4 enclosed alcohol pads. Let dry.  ?Apply patch as indicated in monitor instructions. Patch will be placed under collarbone on left  ?side of chest with arrow pointing upward.  ?Rub patch adhesive wings for 2 minutes. Remove white label marked "1". Remove the white  ?label marked "2". Rub patch adhesive wings for 2 additional minutes.  ?While looking in a mirror, press and release button in center of patch. A small green light will  ?flash 3-4 times. This will be your only indicator that the monitor has been turned on.  ?Do not shower for the first 24 hours. You may shower after the first 24 hours.  ?Press the button if you feel a symptom. You will hear a small click. Record Date, Time and  ?Symptom in the Patient Logbook.  ?When you are ready to remove the patch, follow instructions on the last 2 pages of Patient  ?Logbook. Stick patch monitor  onto the last page of Patient Logbook.  ?Place Patient Logbook in the blue and white box. Use locking tab on box and tape box closed  ?securely. The blue and white box has prepaid postage on it. Please place it in the mailbox as  ?soon as possible. Your physician should have your test results approximately 7 days after the  ?monitor has been mailed back to [facility].  ?Call [facility] at [phone] if you have  questions regarding  ?your ZIO XT patch monitor. Call them immediately if you see an orange light blinking on your  ?monitor.  ?If your monitor falls off in less than 4 days, contact our Monitor department at [phone].  ?If your monitor becomes loose or falls off after 4 days call [employer] at [phone] for  ?suggestions on securing your monitor ? ? ? ?Follow-Up: ? ?4 WEEKS WITH DR. Shari Prows OR KARDIE TOBB DO AT [facility] ? ?:1}  ? ? ? ?

## 2021-05-21 NOTE — Progress Notes (Unsigned)
Enrolled patient for a 3 day Zio XT monitor to be mailed to patients home ? ?Requested spanish instruction ?

## 2021-05-31 ENCOUNTER — Ambulatory Visit: Payer: BC Managed Care – PPO | Admitting: *Deleted

## 2021-05-31 ENCOUNTER — Ambulatory Visit: Payer: BC Managed Care – PPO | Attending: Obstetrics

## 2021-05-31 VITALS — BP 111/60 | HR 113

## 2021-05-31 DIAGNOSIS — Z362 Encounter for other antenatal screening follow-up: Secondary | ICD-10-CM | POA: Diagnosis not present

## 2021-05-31 DIAGNOSIS — O0990 Supervision of high risk pregnancy, unspecified, unspecified trimester: Secondary | ICD-10-CM | POA: Diagnosis not present

## 2021-05-31 DIAGNOSIS — R638 Other symptoms and signs concerning food and fluid intake: Secondary | ICD-10-CM

## 2021-05-31 DIAGNOSIS — O24419 Gestational diabetes mellitus in pregnancy, unspecified control: Secondary | ICD-10-CM

## 2021-05-31 DIAGNOSIS — Z3A32 32 weeks gestation of pregnancy: Secondary | ICD-10-CM | POA: Diagnosis not present

## 2021-05-31 DIAGNOSIS — E669 Obesity, unspecified: Secondary | ICD-10-CM

## 2021-05-31 DIAGNOSIS — O99213 Obesity complicating pregnancy, third trimester: Secondary | ICD-10-CM | POA: Diagnosis not present

## 2021-06-01 ENCOUNTER — Other Ambulatory Visit: Payer: Self-pay | Admitting: *Deleted

## 2021-06-01 DIAGNOSIS — O24415 Gestational diabetes mellitus in pregnancy, controlled by oral hypoglycemic drugs: Secondary | ICD-10-CM

## 2021-06-01 DIAGNOSIS — O3663X0 Maternal care for excessive fetal growth, third trimester, not applicable or unspecified: Secondary | ICD-10-CM

## 2021-06-01 DIAGNOSIS — O09299 Supervision of pregnancy with other poor reproductive or obstetric history, unspecified trimester: Secondary | ICD-10-CM

## 2021-06-07 ENCOUNTER — Other Ambulatory Visit: Payer: BC Managed Care – PPO

## 2021-06-08 ENCOUNTER — Other Ambulatory Visit (HOSPITAL_COMMUNITY): Payer: BC Managed Care – PPO

## 2021-06-10 ENCOUNTER — Other Ambulatory Visit (HOSPITAL_COMMUNITY): Payer: BC Managed Care – PPO

## 2021-06-10 ENCOUNTER — Encounter (HOSPITAL_COMMUNITY): Payer: Self-pay | Admitting: Cardiology

## 2021-06-10 ENCOUNTER — Encounter: Payer: Self-pay | Admitting: Family Medicine

## 2021-06-10 ENCOUNTER — Encounter (HOSPITAL_COMMUNITY): Payer: Self-pay

## 2021-06-10 ENCOUNTER — Encounter: Payer: BC Managed Care – PPO | Admitting: Family Medicine

## 2021-06-10 ENCOUNTER — Other Ambulatory Visit: Payer: BC Managed Care – PPO

## 2021-06-10 NOTE — Progress Notes (Signed)
Verified appointment "no show" status with Kristy Nguyen at 07:33.  ?

## 2021-06-11 NOTE — Progress Notes (Signed)
Patient did not keep appointment today. She will be called to reschedule.  

## 2021-06-14 ENCOUNTER — Other Ambulatory Visit: Payer: Self-pay

## 2021-06-14 ENCOUNTER — Telehealth: Payer: Self-pay | Admitting: *Deleted

## 2021-06-14 NOTE — Telephone Encounter (Signed)
Called pt with interpreter [employer]. I inquired about her missed appointment today @ 3:15 pm.  She stated that she has been in pain for 5 days and may go to the hospital. Previously when she went to [facility] she states that she was told that they could not do anything for her pain so she had not gone yet. I asked if she was having contractions. She replied no however she described intermittent pain which starts in her back, comes around to the front and her abdomen gets hard. I advised that these are contractions - most likely false labor although they can be painful. I informed pt how to time the contractions and advised her to go to the hospital if they occur every 5 minutes for more than one hour and are very painful. She should also go to the hospital if she has leakage of fluid from vagina. Pt stated that her baby is moving well every [place].  Pt was offered appt on 5/3 @ 3:15 pm for NST/BPP that she missed today and she agreed.  ?

## 2021-06-16 ENCOUNTER — Encounter: Payer: BC Managed Care – PPO | Admitting: General Practice

## 2021-06-21 ENCOUNTER — Other Ambulatory Visit: Payer: Self-pay

## 2021-06-21 ENCOUNTER — Encounter: Payer: BC Managed Care – PPO | Admitting: Family Medicine

## 2021-06-21 DIAGNOSIS — O99210 Obesity complicating pregnancy, unspecified trimester: Secondary | ICD-10-CM

## 2021-06-21 DIAGNOSIS — O3660X0 Maternal care for excessive fetal growth, unspecified trimester, not applicable or unspecified: Secondary | ICD-10-CM

## 2021-06-21 DIAGNOSIS — Z3A35 35 weeks gestation of pregnancy: Secondary | ICD-10-CM

## 2021-06-21 DIAGNOSIS — Z789 Other specified health status: Secondary | ICD-10-CM

## 2021-06-21 DIAGNOSIS — O0990 Supervision of high risk pregnancy, unspecified, unspecified trimester: Secondary | ICD-10-CM

## 2021-06-21 DIAGNOSIS — O09299 Supervision of pregnancy with other poor reproductive or obstetric history, unspecified trimester: Secondary | ICD-10-CM

## 2021-06-21 DIAGNOSIS — O24415 Gestational diabetes mellitus in pregnancy, controlled by oral hypoglycemic drugs: Secondary | ICD-10-CM

## 2021-06-21 DIAGNOSIS — A568 Sexually transmitted chlamydial infection of other sites: Secondary | ICD-10-CM

## 2021-06-24 NOTE — Progress Notes (Deleted)
Cardio-Obstetrics Clinic  Follow-up:  Date:  06/24/2021   ID:  Kristy Nguyen, DOB 10/31/1990, MRN [id number]  PCP:  Pcp, No   [facility] Providers Cardiologist:  None  Electrophysiologist:  None       Referring MD: No ref. provider found   Chief Complaint: Palpitations  History of Present Illness:    Kristy Nguyen is a 31 y.o. female [G3P1011] who presents to clinic for follow-up of palpitations.  Has history of gestational diabetes, obesity, oligohydramnios in prior pregnancy.   Patient was initially seen on 05/21/21 for intermittent palpitations. Zio monitor and echo recommended, however, studies not completed.   Today, ***  Prior CV Studies Reviewed: The following studies were reviewed today: No CV procedure.   Past Medical History:  Diagnosis Date   Allergy    Diabetes mellitus without complication (HCC)    gestational   Gestational diabetes    Nexplanon insertion 10/28/2016   NSVD (normal spontaneous vaginal delivery) 09/17/2016   Supervision of high-risk pregnancy 04/05/2016    Clinic [facility] Prenatal Labs  Dating lmp and 12.4 week [place] Blood type: --/--/O POS (02/10 1138)   Genetic Screen 1 Screen:    AFP:     Quad:     NIPS: Antibody: negative  Anatomic [place]  Normal, anterior placenta, female  Rubella:  immune  GTT Early:               Third trimester:  Glucose, Fasting 65 - 91 mg/dL 95    Glucose, 1 hour 65 - 179 mg/dL 425    Glucose, 2 hour 65 - 152 mg/dL 956     RPR:   nega    Past Surgical History:  Procedure Laterality Date   DILATION AND EVACUATION  2010      OB History     Gravida  3   Para  1   Term  1   Preterm  0   AB  1   Living  1      SAB  1   IAB  0   Ectopic  0   Multiple      Live Births  1               Current Medications: No outpatient medications have been marked as taking for the 06/25/21 encounter (Appointment) with Meriam Sprague, MD.     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   Social Determinants of Health   Financial Resource Strain: Not on file  Food Insecurity: Food Insecurity Present  Worried About Charity fundraiser in the Last Year: Sometimes true   [employer] of Food in the Last Year: Sometimes true  Transportation Needs: No Transportation Needs   Lack of Transportation (Medical): No   Lack of Transportation (Non-Medical): No  Physical Activity: Not on file  Stress: Not on file  Social Connections: Not on file      Family History  Problem Relation Age of Onset   Hypertension Mother    Diabetes Father    Heart Problems Father    Anxiety disorder Sister    Depression Sister    Cancer Paternal Uncle        Brain   Diabetes Maternal Grandmother    Diabetes Maternal Grandfather    Diabetes Paternal Grandmother    Diabetes Paternal Grandfather       ROS:   Please see the history of present illness.    Review of Systems  Constitutional:  Positive for malaise/fatigue.  Respiratory:  Positive for shortness of breath.   Cardiovascular:  Positive for palpitations. Negative for chest pain, orthopnea, claudication, leg swelling and PND.  Gastrointestinal:  Negative for blood in stool and melena.   Genitourinary:  Negative for hematuria.  Neurological:  Positive for dizziness. Negative for loss of consciousness.     Labs/EKG Reviewed:    EKG:   ECG 05/07/21: sinus tachycardia with HR 128  Recent Labs: 05/07/2021: ALT 12; BUN 7; Creatinine, Ser 0.45; Hemoglobin 11.2; Platelets 292; Potassium 4.4; Sodium 133   Recent Lipid Panel No results found for: CHOL, TRIG, HDL, CHOLHDL, LDLCALC, LDLDIRECT  Physical Exam:    VS:  LMP 10/16/2020     Wt Readings from Last 3 Encounters:  05/21/21 217 lb (98.4 kg)  05/18/21 215 lb (97.5 kg)  05/06/21 215 lb (97.5 kg)     GEN:  Well nourished, well developed in no acute distress HEENT: Normal NECK: No JVD; No carotid bruits CARDIAC: Tachycardic, regular, soft systolic murmur. No rubs, gallops RESPIRATORY:  Clear to auscultation without rales, wheezing or rhonchi  ABDOMEN: Soft, non-tender, non-distended MUSCULOSKELETAL:  No edema; No deformity  SKIN: Warm and dry NEUROLOGIC:  Alert and oriented x 3 PSYCHIATRIC:  Normal affect    Risk Assessment/Risk Calculators:      {     ASSESSMENT & PLAN:    #Palpitations: Patient presents with several week history of intermittent palpitations that last for about an hour before resolving. Reports associated SOB and diaphoresis. No syncope. Denies episodes of hypoglycemia and BP is well controlled. ECG reviewed and demonstrates sinus tachycardia. Will check zio monitor and TTE for further evaluation. -Check zio monitor -Check TTE -Continue hydration  #Gestational DMII: Higher risk pregnancy. Following with OB.  -Refer to PharmD -Continue metformin, ASA  There are no Patient Instructions on file for this visit.    Dispo:  No follow-ups on file.   Medication Adjustments/Labs and Tests Ordered: Current medicines are reviewed at length with the patient today.  Concerns regarding medicines are outlined above.  Tests Ordered: No orders of the defined types were placed in this  encounter.  Medication Changes: No orders of the defined types were placed in this encounter.

## 2021-06-25 ENCOUNTER — Ambulatory Visit: Payer: Self-pay | Admitting: Cardiology

## 2021-06-28 ENCOUNTER — Ambulatory Visit: Payer: BC Managed Care – PPO | Admitting: *Deleted

## 2021-06-28 ENCOUNTER — Ambulatory Visit (INDEPENDENT_AMBULATORY_CARE_PROVIDER_SITE_OTHER): Payer: BC Managed Care – PPO | Admitting: Obstetrics & Gynecology

## 2021-06-28 ENCOUNTER — Ambulatory Visit (INDEPENDENT_AMBULATORY_CARE_PROVIDER_SITE_OTHER): Payer: BC Managed Care – PPO

## 2021-06-28 VITALS — BP 119/69 | HR 104 | Temp 98.2°F | Wt 223.4 lb

## 2021-06-28 DIAGNOSIS — O3663X0 Maternal care for excessive fetal growth, third trimester, not applicable or unspecified: Secondary | ICD-10-CM

## 2021-06-28 DIAGNOSIS — O99210 Obesity complicating pregnancy, unspecified trimester: Secondary | ICD-10-CM | POA: Diagnosis not present

## 2021-06-28 DIAGNOSIS — Z789 Other specified health status: Secondary | ICD-10-CM

## 2021-06-28 DIAGNOSIS — O0990 Supervision of high risk pregnancy, unspecified, unspecified trimester: Secondary | ICD-10-CM | POA: Diagnosis not present

## 2021-06-28 DIAGNOSIS — O24415 Gestational diabetes mellitus in pregnancy, controlled by oral hypoglycemic drugs: Secondary | ICD-10-CM

## 2021-06-28 DIAGNOSIS — O09299 Supervision of pregnancy with other poor reproductive or obstetric history, unspecified trimester: Secondary | ICD-10-CM

## 2021-06-28 DIAGNOSIS — B001 Herpesviral vesicular dermatitis: Secondary | ICD-10-CM

## 2021-06-28 DIAGNOSIS — Z6841 Body Mass Index (BMI) 40.0 and over, adult: Secondary | ICD-10-CM

## 2021-06-28 DIAGNOSIS — O3660X0 Maternal care for excessive fetal growth, unspecified trimester, not applicable or unspecified: Secondary | ICD-10-CM

## 2021-06-28 MED ORDER — VALACYCLOVIR HCL 1 G PO TABS: 1000.0000 mg | ORAL_TABLET | Freq: Two times a day (BID) | ORAL | 0 refills | Status: AC

## 2021-06-28 NOTE — Progress Notes (Signed)
? ?  PRENATAL VISIT NOTE ? ?Subjective:  ?Kristy Nguyen is a 31 y.o. G3P1011 at [redacted]w[redacted]d being seen today for ongoing prenatal care.  She is currently monitored for the following issues for this high-risk pregnancy and has Urinary tract infection in mother during second trimester of pregnancy; GDM (gestational diabetes mellitus); History of group B Streptococcus (GBS) infection; History of oligohydramnios in prior pregnancy, currently pregnant; Supervision of high risk pregnancy, antepartum; History of postpartum depression, currently pregnant; BMI 40.0-44.9, adult ([place]); Language barrier; Obesity in pregnancy; LGA (large for gestational age) fetus affecting management of mother; History of shoulder dystocia in prior pregnancy, currently pregnant; and Chlamydia trachomatis infection in pregnancy on their problem list. ? ?Patient reports occasional contractions and vaginal discharge and pelvic pain and pressuere .  Contractions: Irritability. Vag. Bleeding: None.  Movement: Present. Denies leaking of fluid.  ? ?The following portions of the patient's history were reviewed and updated as appropriate: allergies, current medications, past family history, past medical history, past social history, past surgical history and problem list.  ? ?Objective:  ? ?Vitals:  ? 06/28/21 1420  ?BP: 119/69  ?Pulse: (!) 104  ?Temp: 98.2 ?F (36.8 ?C)  ?Weight: 223 lb 6.4 oz (101.3 kg)  ? ? ?Fetal Status: Fetal Heart Rate (bpm): 130   Movement: Present    ? ?General:  Alert, oriented and cooperative. Patient is in no acute distress.  ?Skin: Skin is warm and dry. No rash noted.   ?Cardiovascular: Normal heart rate noted  ?Respiratory: Normal respiratory effort, no problems with respiration noted  ?Abdomen: Soft, gravid, appropriate for gestational age.  Pain/Pressure: Present     ?Pelvic: Cervical exam deferred        ?Extremities: Normal range of motion.  Edema: None  ?Mental Status: Normal mood and affect. Normal behavior. Normal  judgment and thought content.  ? ?Assessment and Plan:  ?Pregnancy: G3P1011 at [redacted]w[redacted]d ?1. Excessive fetal growth affecting management of pregnancy, antepartum, single or unspecified fetus ?Macrosomia, scheduled for CS 39 weeks ? ?2. History of shoulder dystocia in prior pregnancy, currently pregnant ?CS 39 weeks ? ?3. BMI 40.0-44.9, adult ([place]) ?Body mass index is 43.63 kg/m?. ? ? ?4. Supervision of high risk pregnancy, antepartum ?Routine test. Exam no gross ROM, no pool, scant d/c ?- Culture, beta strep (group b only) ? ?5. Gestational diabetes mellitus (GDM) in third trimester controlled on oral hypoglycemic drug ?FBS 100-110, PP 130-140 ? ?6. Language barrier ?Spanish ? ?Preterm labor symptoms and general obstetric precautions including but not limited to vaginal bleeding, contractions, leaking of fluid and fetal movement were reviewed in detail with the patient. ?Please refer to After Visit Summary for other counseling recommendations.  ? ?Return in about 1 week (around 07/05/2021). ? ?Future Appointments  ?Date Time Provider [place]  ?06/28/2021  3:15 PM [facility] NST [facility] [place]  ?07/05/2021  3:15 PM [facility] NURSE [facility]  ?07/05/2021  3:30 PM [facility] US2 [facility] [place]  ?07/14/2021  3:15 PM [facility] NST [facility] [place]  ?07/19/2021  3:15 PM [facility] NST [facility]  ?07/26/2021  3:15 PM [facility] NST [facility]  ? ? ?Emeterio Reeve, MD ? ?

## 2021-06-28 NOTE — Progress Notes (Signed)
States she feels like water is leaking out constantly for 4 days. Concerned she is leaking amniotic fluid as this happened with her first pregnancy. States stomach hurts when she touches it.  ? ?Maternity support belt given. ? Fleet Contras RN ?06/28/21 ?

## 2021-06-29 NOTE — Progress Notes (Signed)
This encounter was created in error - please disregard.

## 2021-07-01 NOTE — Patient Instructions (Signed)
Instrucciones Para Antes de la Ciruga   Su ciruga est programada para 07/16/2021  (your procedure is scheduled on) Entre por la entrada principal del [facility]  a las [address] de [address] [place] -(enter through the main entrance at [facility] at 0745 AM    [address] [place] [phone] para informarnos de su llegada. (pick up phone, dial [phone] on arrival)     Por favor llame al [phone] si tiene algn problema en la maana de la ciruga (please call this number if you have any problems the morning of surgery.)                  Recuerde: (Remember)  No coma alimentos. (Do not eat food (After Midnight) Desps de medianoche)    No tome lquidos claros. (Do not drink clear liquids (After Midnight) Desps de medianoche)    No use joyas, maquillaje de ojos, lpiz labial, crema para el cuerpo o esmalte de uas oscuro. (Do not wear jewelry, eye makeup, lipstick, body lotion, or dark fingernail polish). Puede usar desodorante (you may wear deodorant)    No se afeite 48 horas de su ciruga. (Do not shave 48 hours before your surgery)    No traiga objetos de valor al hospital.  [facility] no se hace responsable de ninguna pertenencia, ni objetos de valor que haya trado al hospital. (Do not bring valuable to the hospital.  [facility] is not responsible for any belongings or valuables brought to the hospital)   [facility] medicinas en la maana de la ciruga con un SORBITO de agua nada (take these meds the morning of surgery with a SIP of water)     Durante la ciruga no se pueden usar lentes de contacto, dentaduras o puentes. (Contacts, dentures or bridgework cannot be worn in surgery).   Si va a ser ingresado despus de la ciruga, deje la [employer] en el carro hasta que se le haya asignado una habitacin. (If you are to be admitted after surgery, leave suitcase in car until your room has been assigned.)   A los pacientes que se les d de  alta el mismo da no se les permitir manejar a casa.  (Patients discharged on the day of surgery will not be allowed to drive home)    [place] y nmero de telfono del [employer], multimedia na. (Name and telephone number of your driver)   Instrucciones especiales Shower using CHG 2 nights before surgery and the night before surgery.  If you shower the day of surgery use CHG.  Use special wash - you have one bottle of CHG for all showers.  You should use approximately 1/3 of the bottle for each shower. (Special Instructions)   Por favor, lea las hojas informativas que le entregaron. (Please read over the following fact sheets that you were given) Surgical Site Infection Prevention

## 2021-07-02 ENCOUNTER — Encounter (HOSPITAL_COMMUNITY): Payer: Self-pay

## 2021-07-02 ENCOUNTER — Telehealth (HOSPITAL_COMMUNITY): Payer: Self-pay | Admitting: *Deleted

## 2021-07-02 LAB — CULTURE, BETA STREP (GROUP B ONLY): Strep Gp B Culture: NEGATIVE

## 2021-07-02 NOTE — Pre-Procedure Instructions (Signed)
[phone] interpreter number

## 2021-07-02 NOTE — Telephone Encounter (Signed)
Preadmission screen  

## 2021-07-05 ENCOUNTER — Encounter: Payer: Self-pay | Admitting: *Deleted

## 2021-07-05 ENCOUNTER — Ambulatory Visit: Payer: BC Managed Care – PPO | Attending: Obstetrics and Gynecology

## 2021-07-05 ENCOUNTER — Ambulatory Visit: Payer: BC Managed Care – PPO | Admitting: *Deleted

## 2021-07-05 ENCOUNTER — Other Ambulatory Visit: Payer: Self-pay

## 2021-07-05 VITALS — BP 120/78 | HR 112

## 2021-07-05 DIAGNOSIS — O99213 Obesity complicating pregnancy, third trimester: Secondary | ICD-10-CM

## 2021-07-05 DIAGNOSIS — O3663X0 Maternal care for excessive fetal growth, third trimester, not applicable or unspecified: Secondary | ICD-10-CM

## 2021-07-05 DIAGNOSIS — O24415 Gestational diabetes mellitus in pregnancy, controlled by oral hypoglycemic drugs: Secondary | ICD-10-CM | POA: Diagnosis not present

## 2021-07-05 DIAGNOSIS — O09299 Supervision of pregnancy with other poor reproductive or obstetric history, unspecified trimester: Secondary | ICD-10-CM | POA: Insufficient documentation

## 2021-07-05 DIAGNOSIS — O24410 Gestational diabetes mellitus in pregnancy, diet controlled: Secondary | ICD-10-CM

## 2021-07-05 DIAGNOSIS — O0990 Supervision of high risk pregnancy, unspecified, unspecified trimester: Secondary | ICD-10-CM | POA: Diagnosis not present

## 2021-07-05 DIAGNOSIS — O09293 Supervision of pregnancy with other poor reproductive or obstetric history, third trimester: Secondary | ICD-10-CM

## 2021-07-05 DIAGNOSIS — E669 Obesity, unspecified: Secondary | ICD-10-CM

## 2021-07-05 DIAGNOSIS — Z3A37 37 weeks gestation of pregnancy: Secondary | ICD-10-CM

## 2021-07-05 NOTE — Progress Notes (Signed)
States she has been leaking fluid for 2 weeks, clear and has passed some stringy mucous. C/O UC's every hour. States fetal movement is "slowing down." C/O "cold sweats."

## 2021-07-06 ENCOUNTER — Encounter: Payer: BC Managed Care – PPO | Admitting: Family Medicine

## 2021-07-07 ENCOUNTER — Encounter: Payer: BC Managed Care – PPO | Admitting: Family Medicine

## 2021-07-09 ENCOUNTER — Ambulatory Visit: Payer: BC Managed Care – PPO

## 2021-07-09 ENCOUNTER — Other Ambulatory Visit: Payer: Self-pay

## 2021-07-13 ENCOUNTER — Other Ambulatory Visit: Payer: Self-pay | Admitting: Obstetrics & Gynecology

## 2021-07-13 DIAGNOSIS — Z3480 Encounter for supervision of other normal pregnancy, unspecified trimester: Secondary | ICD-10-CM

## 2021-07-14 ENCOUNTER — Other Ambulatory Visit (HOSPITAL_COMMUNITY)
Admission: RE | Admit: 2021-07-14 | Discharge: 2021-07-14 | Disposition: A | Discharge status: Home / Self Care | Payer: BC Managed Care – PPO | Source: Ambulatory Visit | Attending: Family Medicine | Admitting: Family Medicine

## 2021-07-14 ENCOUNTER — Encounter (HOSPITAL_COMMUNITY): Payer: Self-pay

## 2021-07-14 ENCOUNTER — Other Ambulatory Visit: Payer: Self-pay

## 2021-07-14 ENCOUNTER — Telehealth (HOSPITAL_COMMUNITY): Payer: Self-pay | Admitting: *Deleted

## 2021-07-14 HISTORY — DX: Personal history of other complications of pregnancy, childbirth and the puerperium: Z87.59

## 2021-07-14 NOTE — Telephone Encounter (Signed)
Preadmission screen [phone] interpreter number

## 2021-07-15 ENCOUNTER — Encounter: Payer: BC Managed Care – PPO | Admitting: Obstetrics and Gynecology

## 2021-07-15 NOTE — Anesthesia Preprocedure Evaluation (Signed)
Anesthesia Evaluation  Patient identified by MRN, date of birth, ID band Patient awake    Reviewed: Allergy & Precautions, NPO status , Patient's Chart, lab work & pertinent test results  Airway Mallampati: II  TM Distance: >3 FB Neck ROM: Full    Dental no notable dental hx. (+) Teeth Intact, Dental Advisory Given   Pulmonary former smoker,    Pulmonary exam normal breath sounds clear to auscultation       Cardiovascular Exercise Tolerance: Good Normal cardiovascular exam Rhythm:Regular Rate:Normal     Neuro/Psych negative neurological ROS  negative psych ROS   GI/Hepatic negative GI ROS, Neg liver ROS,   Endo/Other  diabetes, Gestational  Renal/GU negative Renal ROS     Musculoskeletal   Abdominal (+) + obese (BMI 41.2),   Peds  Hematology Lab Results      Component                Value               Date                      HGB                      11.2 (L)            05/07/2021                HCT                      33.7 (L)            05/07/2021               PLT                      292                 05/07/2021              Anesthesia Other Findings All: Latex, PCN  Reproductive/Obstetrics (+) Pregnancy                            Anesthesia Physical Anesthesia Plan  ASA: 3  Anesthesia Plan: Spinal   Post-op Pain Management: Regional block* and Toradol IV (intra-op)*   Induction:   PONV Risk Score and Plan: Treatment may vary due to age or medical condition and Ondansetron  Airway Management Planned: Natural Airway and Nasal Cannula  Additional Equipment: None  Intra-op Plan:   Post-operative Plan:   Informed Consent: I have reviewed the patients History and Physical, chart, labs and discussed the procedure including the risks, benefits and alternatives for the proposed anesthesia with the patient or authorized representative who has indicated his/her understanding  and acceptance.     Dental advisory given  Plan Discussed with:   Anesthesia Plan Comments: (38.6 wk G3P1 for Primary C/S)       Anesthesia Quick Evaluation

## 2021-07-16 ENCOUNTER — Inpatient Hospital Stay (HOSPITAL_COMMUNITY): Payer: BC Managed Care – PPO | Admitting: Anesthesiology

## 2021-07-16 ENCOUNTER — Encounter (HOSPITAL_COMMUNITY): Payer: Self-pay | Admitting: Obstetrics & Gynecology

## 2021-07-16 ENCOUNTER — Encounter (HOSPITAL_COMMUNITY)
Admission: RE | Disposition: A | Discharge status: Home / Self Care | Payer: Self-pay | Source: Home / Self Care | Attending: Obstetrics & Gynecology

## 2021-07-16 ENCOUNTER — Encounter: Payer: Self-pay | Admitting: Radiology

## 2021-07-16 ENCOUNTER — Inpatient Hospital Stay (HOSPITAL_COMMUNITY)
Admission: RE | Admit: 2021-07-16 | Discharge: 2021-07-18 | DRG: 788 | Disposition: A | Discharge status: Home / Self Care | Payer: BC Managed Care – PPO | Attending: Obstetrics & Gynecology | Admitting: Obstetrics & Gynecology

## 2021-07-16 ENCOUNTER — Other Ambulatory Visit: Payer: Self-pay

## 2021-07-16 DIAGNOSIS — O24425 Gestational diabetes mellitus in childbirth, controlled by oral hypoglycemic drugs: Secondary | ICD-10-CM | POA: Diagnosis present

## 2021-07-16 DIAGNOSIS — O3663X0 Maternal care for excessive fetal growth, third trimester, not applicable or unspecified: Secondary | ICD-10-CM | POA: Diagnosis present

## 2021-07-16 DIAGNOSIS — Z3A39 39 weeks gestation of pregnancy: Secondary | ICD-10-CM

## 2021-07-16 DIAGNOSIS — Z0289 Encounter for other administrative examinations: Secondary | ICD-10-CM

## 2021-07-16 DIAGNOSIS — Z758 Other problems related to medical facilities and other health care: Secondary | ICD-10-CM | POA: Diagnosis present

## 2021-07-16 DIAGNOSIS — O09299 Supervision of pregnancy with other poor reproductive or obstetric history, unspecified trimester: Secondary | ICD-10-CM

## 2021-07-16 DIAGNOSIS — Z880 Allergy status to penicillin: Secondary | ICD-10-CM | POA: Diagnosis not present

## 2021-07-16 DIAGNOSIS — Z789 Other specified health status: Secondary | ICD-10-CM | POA: Diagnosis present

## 2021-07-16 DIAGNOSIS — Z23 Encounter for immunization: Secondary | ICD-10-CM | POA: Diagnosis not present

## 2021-07-16 DIAGNOSIS — O26893 Other specified pregnancy related conditions, third trimester: Principal | ICD-10-CM | POA: Diagnosis present

## 2021-07-16 DIAGNOSIS — O99214 Obesity complicating childbirth: Secondary | ICD-10-CM | POA: Diagnosis not present

## 2021-07-16 DIAGNOSIS — Z8632 Personal history of gestational diabetes: Secondary | ICD-10-CM | POA: Diagnosis present

## 2021-07-16 DIAGNOSIS — O24419 Gestational diabetes mellitus in pregnancy, unspecified control: Secondary | ICD-10-CM | POA: Diagnosis present

## 2021-07-16 DIAGNOSIS — Z8759 Personal history of other complications of pregnancy, childbirth and the puerperium: Secondary | ICD-10-CM

## 2021-07-16 DIAGNOSIS — Z6841 Body Mass Index (BMI) 40.0 and over, adult: Secondary | ICD-10-CM

## 2021-07-16 DIAGNOSIS — O3660X0 Maternal care for excessive fetal growth, unspecified trimester, not applicable or unspecified: Secondary | ICD-10-CM | POA: Diagnosis present

## 2021-07-16 DIAGNOSIS — Z0542 Observation and evaluation of newborn for suspected metabolic condition ruled out: Secondary | ICD-10-CM | POA: Diagnosis not present

## 2021-07-16 DIAGNOSIS — O24420 Gestational diabetes mellitus in childbirth, diet controlled: Secondary | ICD-10-CM | POA: Diagnosis not present

## 2021-07-16 DIAGNOSIS — O24429 Gestational diabetes mellitus in childbirth, unspecified control: Secondary | ICD-10-CM | POA: Diagnosis not present

## 2021-07-16 DIAGNOSIS — Z3480 Encounter for supervision of other normal pregnancy, unspecified trimester: Secondary | ICD-10-CM

## 2021-07-16 DIAGNOSIS — O99210 Obesity complicating pregnancy, unspecified trimester: Secondary | ICD-10-CM | POA: Diagnosis present

## 2021-07-16 LAB — CBC
HCT: 35.4 % — ABNORMAL LOW (ref 36.0–46.0)
Hemoglobin: 11.6 g/dL — ABNORMAL LOW (ref 12.0–15.0)
MCH: 29.2 pg (ref 26.0–34.0)
MCHC: 32.8 g/dL (ref 30.0–36.0)
MCV: 89.2 fL (ref 80.0–100.0)
Platelets: 319 10*3/uL (ref 150–400)
RBC: 3.97 MIL/uL (ref 3.87–5.11)
RDW: 13.5 % (ref 11.5–15.5)
WBC: 14.4 10*3/uL — ABNORMAL HIGH (ref 4.0–10.5)
nRBC: 0 % (ref 0.0–0.2)

## 2021-07-16 LAB — GLUCOSE, CAPILLARY
Glucose-Capillary: 114 mg/dL — ABNORMAL HIGH (ref 70–99)
Glucose-Capillary: 112 mg/dL — ABNORMAL HIGH (ref 70–99)

## 2021-07-16 LAB — CREATININE, SERUM
Creatinine, Ser: 0.57 mg/dL (ref 0.44–1.00)
GFR, Estimated: >60 mL/min (ref >60)

## 2021-07-16 LAB — TYPE AND SCREEN
ABO/RH(D): O POS
Antibody Screen: NEGATIVE

## 2021-07-16 LAB — RPR: RPR Ser Ql: NONREACTIVE

## 2021-07-16 SURGERY — Surgical Case
Anesthesia: Spinal

## 2021-07-16 MED ORDER — COCONUT OIL OIL: 1.0000 "application " | TOPICAL_OIL | Status: DC | PRN

## 2021-07-16 MED ORDER — HYDROMORPHONE HCL 1 MG/ML IJ SOLN: 0.2500 mg | INTRAMUSCULAR | Status: DC | PRN

## 2021-07-16 MED ORDER — MEASLES, MUMPS & RUBELLA VAC IJ SOLR: 0.5000 mL | Freq: Once | INTRAMUSCULAR | Status: DC

## 2021-07-16 MED ORDER — SODIUM CHLORIDE 0.9% FLUSH: 3.0000 mL | INTRAVENOUS | Status: DC | PRN

## 2021-07-16 MED ORDER — MORPHINE SULFATE (PF) 0.5 MG/ML IJ SOLN
INTRAMUSCULAR | Status: DC | PRN
  Administered 2021-07-16: 150 ug via INTRATHECAL

## 2021-07-16 MED ORDER — KETOROLAC TROMETHAMINE 30 MG/ML IJ SOLN
30.0000 mg | Freq: Four times a day (QID) | INTRAMUSCULAR | Status: AC
  Administered 2021-07-16 – 2021-07-17 (×3): 30 mg via INTRAVENOUS
  Filled 2021-07-16 (×3): qty 1

## 2021-07-16 MED ORDER — OXYTOCIN-SODIUM CHLORIDE 30-0.9 UT/500ML-% IV SOLN
INTRAVENOUS | Status: AC
  Filled 2021-07-16: qty 500

## 2021-07-16 MED ORDER — LACTATED RINGERS IV SOLN: INTRAVENOUS | Status: DC

## 2021-07-16 MED ORDER — OXYTOCIN-SODIUM CHLORIDE 30-0.9 UT/500ML-% IV SOLN
INTRAVENOUS | Status: DC | PRN
  Administered 2021-07-16: 300 mL via INTRAVENOUS

## 2021-07-16 MED ORDER — TETANUS-DIPHTH-ACELL PERTUSSIS 5-2.5-18.5 LF-MCG/0.5 IM SUSY: 0.5000 mL | PREFILLED_SYRINGE | Freq: Once | INTRAMUSCULAR | Status: DC

## 2021-07-16 MED ORDER — MORPHINE SULFATE (PF) 0.5 MG/ML IJ SOLN
INTRAMUSCULAR | Status: AC
  Filled 2021-07-16: qty 10

## 2021-07-16 MED ORDER — IBUPROFEN 600 MG PO TABS
600.0000 mg | ORAL_TABLET | Freq: Four times a day (QID) | ORAL | Status: DC
  Administered 2021-07-17 – 2021-07-18 (×6): 600 mg via ORAL
  Filled 2021-07-16 (×6): qty 1

## 2021-07-16 MED ORDER — CEFAZOLIN SODIUM-DEXTROSE 2-4 GM/100ML-% IV SOLN
2.0000 g | Freq: Once | INTRAVENOUS | Status: AC
  Administered 2021-07-16: 2 g via INTRAVENOUS

## 2021-07-16 MED ORDER — DIPHENHYDRAMINE HCL 25 MG PO CAPS: 25.0000 mg | ORAL_CAPSULE | ORAL | Status: DC | PRN

## 2021-07-16 MED ORDER — FENTANYL CITRATE (PF) 100 MCG/2ML IJ SOLN
INTRAMUSCULAR | Status: DC | PRN
Start: 2021-07-16 — End: 2021-07-16
  Administered 2021-07-16: 15 ug via INTRATHECAL

## 2021-07-16 MED ORDER — ONDANSETRON HCL 4 MG/2ML IJ SOLN
INTRAMUSCULAR | Status: DC | PRN
  Administered 2021-07-16: 4 mg via INTRAVENOUS

## 2021-07-16 MED ORDER — FENTANYL CITRATE (PF) 100 MCG/2ML IJ SOLN
INTRAMUSCULAR | Status: AC
  Filled 2021-07-16: qty 2

## 2021-07-16 MED ORDER — PHENYLEPHRINE 80 MCG/ML (10ML) SYRINGE FOR IV PUSH (FOR BLOOD PRESSURE SUPPORT)
PREFILLED_SYRINGE | INTRAVENOUS | Status: AC
  Filled 2021-07-16: qty 20

## 2021-07-16 MED ORDER — BUPIVACAINE IN DEXTROSE 0.75-8.25 % IT SOLN
INTRATHECAL | Status: DC | PRN
  Administered 2021-07-16: 1.6 mL via INTRATHECAL

## 2021-07-16 MED ORDER — ONDANSETRON HCL 4 MG/2ML IJ SOLN: 4.0000 mg | Freq: Three times a day (TID) | INTRAMUSCULAR | Status: DC | PRN

## 2021-07-16 MED ORDER — MAGNESIUM HYDROXIDE 400 MG/5ML PO SUSP: 30.0000 mL | ORAL | Status: DC | PRN

## 2021-07-16 MED ORDER — PHENYLEPHRINE HCL-NACL 20-0.9 MG/250ML-% IV SOLN
INTRAVENOUS | Status: AC
  Filled 2021-07-16: qty 250

## 2021-07-16 MED ORDER — ENOXAPARIN SODIUM 40 MG/0.4ML IJ SOSY: 40.0000 mg | PREFILLED_SYRINGE | INTRAMUSCULAR | Status: DC

## 2021-07-16 MED ORDER — ENOXAPARIN SODIUM 60 MG/0.6ML IJ SOSY
50.0000 mg | PREFILLED_SYRINGE | INTRAMUSCULAR | Status: DC
  Administered 2021-07-18: 50 mg via SUBCUTANEOUS
  Filled 2021-07-16: qty 0.6

## 2021-07-16 MED ORDER — MENTHOL 3 MG MT LOZG: 1.0000 | LOZENGE | OROMUCOSAL | Status: DC | PRN

## 2021-07-16 MED ORDER — MEPERIDINE HCL 25 MG/ML IJ SOLN: 6.2500 mg | INTRAMUSCULAR | Status: DC | PRN

## 2021-07-16 MED ORDER — KETOROLAC TROMETHAMINE 30 MG/ML IJ SOLN
INTRAMUSCULAR | Status: AC
  Filled 2021-07-16: qty 1

## 2021-07-16 MED ORDER — NALOXONE HCL 0.4 MG/ML IJ SOLN: 0.4000 mg | INTRAMUSCULAR | Status: DC | PRN

## 2021-07-16 MED ORDER — ONDANSETRON HCL 4 MG/2ML IJ SOLN: 4.0000 mg | Freq: Once | INTRAMUSCULAR | Status: DC | PRN

## 2021-07-16 MED ORDER — ACETAMINOPHEN 500 MG PO TABS
1000.0000 mg | ORAL_TABLET | Freq: Four times a day (QID) | ORAL | Status: DC
  Administered 2021-07-16 – 2021-07-18 (×7): 1000 mg via ORAL
  Filled 2021-07-16 (×8): qty 2

## 2021-07-16 MED ORDER — ACETAMINOPHEN 10 MG/ML IV SOLN
INTRAVENOUS | Status: DC | PRN
  Administered 2021-07-16: 1000 mg via INTRAVENOUS

## 2021-07-16 MED ORDER — WITCH HAZEL-GLYCERIN EX PADS: 1.0000 "application " | MEDICATED_PAD | CUTANEOUS | Status: DC | PRN

## 2021-07-16 MED ORDER — SIMETHICONE 80 MG PO CHEW: 80.0000 mg | CHEWABLE_TABLET | ORAL | Status: DC | PRN

## 2021-07-16 MED ORDER — DEXAMETHASONE SODIUM PHOSPHATE 10 MG/ML IJ SOLN
INTRAMUSCULAR | Status: AC
  Filled 2021-07-16: qty 1

## 2021-07-16 MED ORDER — POVIDONE-IODINE 10 % EX SWAB
2.0000 "application " | Freq: Once | CUTANEOUS | Status: AC
  Administered 2021-07-16: 2 via TOPICAL

## 2021-07-16 MED ORDER — OXYCODONE HCL 5 MG/5ML PO SOLN: 5.0000 mg | Freq: Once | ORAL | Status: DC | PRN

## 2021-07-16 MED ORDER — SCOPOLAMINE 1 MG/3DAYS TD PT72: 1.0000 | MEDICATED_PATCH | Freq: Once | TRANSDERMAL | Status: DC

## 2021-07-16 MED ORDER — MEDROXYPROGESTERONE ACETATE 150 MG/ML IM SUSP
150.0000 mg | INTRAMUSCULAR | Status: DC | PRN
Start: 2021-07-16 — End: 2021-07-19

## 2021-07-16 MED ORDER — ACETAMINOPHEN 10 MG/ML IV SOLN
INTRAVENOUS | Status: AC
  Filled 2021-07-16: qty 100

## 2021-07-16 MED ORDER — KETOROLAC TROMETHAMINE 30 MG/ML IJ SOLN
30.0000 mg | Freq: Once | INTRAMUSCULAR | Status: AC | PRN
  Administered 2021-07-16: 30 mg via INTRAVENOUS

## 2021-07-16 MED ORDER — NALOXONE HCL 4 MG/10ML IJ SOLN: 1.0000 ug/kg/h | INTRAVENOUS | Status: DC | PRN

## 2021-07-16 MED ORDER — PHENYLEPHRINE 80 MCG/ML (10ML) SYRINGE FOR IV PUSH (FOR BLOOD PRESSURE SUPPORT)
PREFILLED_SYRINGE | INTRAVENOUS | Status: DC | PRN
  Administered 2021-07-16 (×4): 80 ug via INTRAVENOUS

## 2021-07-16 MED ORDER — OXYTOCIN-SODIUM CHLORIDE 30-0.9 UT/500ML-% IV SOLN
2.5000 [IU]/h | INTRAVENOUS | Status: AC
  Administered 2021-07-16: 2.5 [IU]/h via INTRAVENOUS
  Filled 2021-07-16: qty 500

## 2021-07-16 MED ORDER — SIMETHICONE 80 MG PO CHEW
80.0000 mg | CHEWABLE_TABLET | Freq: Three times a day (TID) | ORAL | Status: DC
  Administered 2021-07-16 – 2021-07-18 (×6): 80 mg via ORAL
  Filled 2021-07-16 (×6): qty 1

## 2021-07-16 MED ORDER — SENNOSIDES-DOCUSATE SODIUM 8.6-50 MG PO TABS
2.0000 | ORAL_TABLET | Freq: Every day | ORAL | Status: DC
  Administered 2021-07-17 – 2021-07-18 (×2): 2 via ORAL
  Filled 2021-07-16 (×2): qty 2

## 2021-07-16 MED ORDER — BUPIVACAINE HCL (PF) 0.5 % IJ SOLN
INTRAMUSCULAR | Status: AC
  Filled 2021-07-16: qty 30

## 2021-07-16 MED ORDER — DIPHENHYDRAMINE HCL 50 MG/ML IJ SOLN: 12.5000 mg | INTRAMUSCULAR | Status: DC | PRN

## 2021-07-16 MED ORDER — PHENYLEPHRINE HCL-NACL 20-0.9 MG/250ML-% IV SOLN
INTRAVENOUS | Status: DC | PRN
  Administered 2021-07-16: 60 ug/min via INTRAVENOUS

## 2021-07-16 MED ORDER — ONDANSETRON HCL 4 MG/2ML IJ SOLN
INTRAMUSCULAR | Status: AC
  Filled 2021-07-16: qty 2

## 2021-07-16 MED ORDER — DIBUCAINE (PERIANAL) 1 % EX OINT: 1.0000 "application " | TOPICAL_OINTMENT | CUTANEOUS | Status: DC | PRN

## 2021-07-16 MED ORDER — DIPHENHYDRAMINE HCL 25 MG PO CAPS: 25.0000 mg | ORAL_CAPSULE | Freq: Four times a day (QID) | ORAL | Status: DC | PRN

## 2021-07-16 MED ORDER — OXYCODONE HCL 5 MG PO TABS: 5.0000 mg | ORAL_TABLET | Freq: Once | ORAL | Status: DC | PRN

## 2021-07-16 MED ORDER — OXYCODONE HCL 5 MG PO TABS
5.0000 mg | ORAL_TABLET | ORAL | Status: DC | PRN
  Administered 2021-07-17 – 2021-07-18 (×5): 5 mg via ORAL
  Filled 2021-07-16 (×5): qty 1

## 2021-07-16 MED ORDER — GABAPENTIN 100 MG PO CAPS
200.0000 mg | ORAL_CAPSULE | Freq: Every day | ORAL | Status: DC
  Administered 2021-07-16 – 2021-07-17 (×2): 200 mg via ORAL
  Filled 2021-07-16 (×2): qty 2

## 2021-07-16 MED ORDER — DEXAMETHASONE SODIUM PHOSPHATE 10 MG/ML IJ SOLN
INTRAMUSCULAR | Status: DC | PRN
  Administered 2021-07-16 (×2): 5 mg via INTRAVENOUS

## 2021-07-16 SURGICAL SUPPLY — 35 items
BARRIER ADHS 3X4 INTERCEED (GAUZE/BANDAGES/DRESSINGS) IMPLANT
BENZOIN TINCTURE PRP APPL 2/3 (GAUZE/BANDAGES/DRESSINGS) ×1 IMPLANT
CHLORAPREP W/TINT 26ML (MISCELLANEOUS) ×4 IMPLANT
CLAMP CORD UMBIL (MISCELLANEOUS) ×2 IMPLANT
CLIP FILSHIE TUBAL LIGA STRL (Clip) IMPLANT
CLOSURE STERI STRIP 1/2 X4 (GAUZE/BANDAGES/DRESSINGS) ×1 IMPLANT
CLOTH BEACON ORANGE TIMEOUT ST (SAFETY) ×2 IMPLANT
DRSG OPSITE POSTOP 4X10 (GAUZE/BANDAGES/DRESSINGS) ×2 IMPLANT
ELECT REM PT RETURN 9FT ADLT (ELECTROSURGICAL) ×2
ELECTRODE REM PT RTRN 9FT ADLT (ELECTROSURGICAL) ×1 IMPLANT
EXTRACTOR VACUUM KIWI (MISCELLANEOUS) IMPLANT
GLOVE BIO SURGEON STRL SZ 6.5 (GLOVE) ×2 IMPLANT
GLOVE BIOGEL PI IND STRL 7.0 (GLOVE) ×2 IMPLANT
GLOVE BIOGEL PI INDICATOR 7.0 (GLOVE) ×2
GOWN STRL REUS W/TWL LRG LVL3 (GOWN DISPOSABLE) ×4 IMPLANT
KIT ABG SYR 3ML LUER SLIP (SYRINGE) IMPLANT
MAT PREVALON FULL STRYKER (MISCELLANEOUS) ×1 IMPLANT
NDL HYPO 25X5/8 SAFETYGLIDE (NEEDLE) IMPLANT
NEEDLE HYPO 22GX1.5 SAFETY (NEEDLE) IMPLANT
NEEDLE HYPO 25X5/8 SAFETYGLIDE (NEEDLE) IMPLANT
NS IRRIG 1000ML POUR BTL (IV SOLUTION) ×2 IMPLANT
PACK C SECTION WH (CUSTOM PROCEDURE TRAY) ×2 IMPLANT
PAD OB MATERNITY 4.3X12.25 (PERSONAL CARE ITEMS) ×2 IMPLANT
RETRACTOR WND ALEXIS 25 LRG (MISCELLANEOUS) IMPLANT
RTRCTR WOUND ALEXIS 25CM LRG (MISCELLANEOUS)
SUT PLAIN 2 0 XLH (SUTURE) ×1 IMPLANT
SUT VIC AB 0 CT1 36 (SUTURE) ×12 IMPLANT
SUT VIC AB 2-0 CT1 27 (SUTURE) ×1
SUT VIC AB 2-0 CT1 TAPERPNT 27 (SUTURE) ×1 IMPLANT
SUT VIC AB 4-0 KS 27 (SUTURE) ×1 IMPLANT
SUT VIC AB 4-0 PS2 27 (SUTURE) ×2 IMPLANT
SYR CONTROL 10ML LL (SYRINGE) IMPLANT
TOWEL OR 17X24 6PK STRL BLUE (TOWEL DISPOSABLE) ×2 IMPLANT
TRAY FOLEY W/BAG SLVR 14FR LF (SET/KITS/TRAYS/PACK) IMPLANT
WATER STERILE IRR 1000ML POUR (IV SOLUTION) ×2 IMPLANT

## 2021-07-16 NOTE — Transfer of Care (Signed)
Immediate Anesthesia Transfer of Care Note  Patient: Kristy Nguyen  Procedure(s) Performed: CESAREAN SECTION  Patient Location: PACU  Anesthesia Type:Spinal  Level of Consciousness: awake, alert  and oriented  Airway & Oxygen Therapy: Patient Spontanous Breathing  Post-op Assessment: Report given to RN and Post -op Vital signs reviewed and stable  Post vital signs: Reviewed and stable  Last Vitals:  Vitals Value Taken Time  BP 109/68 07/16/21 1310  Temp 36.8 C 07/16/21 1310  Pulse 93 07/16/21 1310  Resp 18 07/16/21 1310  SpO2 97 % 07/16/21 1310    Last Pain:  Vitals:   07/16/21 1345  TempSrc:   PainSc: 0-No pain         Complications: No notable events documented.

## 2021-07-16 NOTE — Discharge Summary (Signed)
Postpartum Discharge Summary  Date of Service updated-yes     Patient Name: Kristy Nguyen DOB: 1990-09-03 MRN: [id number]  Date of admission: 07/16/2021 Delivery date:07/16/2021  Delivering provider: Woodroe Mode  Date of discharge: 07/18/2021  Admitting diagnosis: Delivery of pregnancy by cesarean section [O82] History of shoulder dystocia in prior pregnancy, currently pregnant [O09.299] Intrauterine pregnancy: 100w0d     Secondary diagnosis:  Principal Problem:   Postpartum care following cesarean delivery Active Problems:   GDM (gestational diabetes mellitus)   BMI 40.0-44.9, adult ([place])   Language barrier   Obesity in pregnancy   LGA (large for gestational age) fetus affecting management of mother   History of shoulder dystocia in prior pregnancy, currently pregnant   Delivery of pregnancy by cesarean section  Additional problems: language barrier   Discharge diagnosis: Term Pregnancy Delivered and GDM A2                                              Post partum procedures: none Augmentation: N/A Complications: None  Hospital course: Sceduled C/S   31 y.o. yo [id number] at [redacted]w[redacted]d was admitted to the hospital 07/16/2021 for scheduled cesarean section with the following indication: history of severe SD and current macrosomia .Delivery details are as follows:  Membrane Rupture Time/Date: 10:53 AM ,07/16/2021   Delivery Method:C-Section, Low Transverse  Details of operation can be found in separate operative note.  Patient had an uncomplicated postpartum course.  She is ambulating, tolerating a regular diet, passing flatus, and urinating well. Patient is discharged home in stable condition on  07/18/21        Newborn Data: Birth date:07/16/2021  Birth time:10:54 AM  Gender:Female  Living status:Living  Apgars:8, 9 Weight:4790 g     Magnesium Sulfate received: No BMZ received: No Rhophylac:No MMR:No T-DaP:Given prenatally Flu: No Transfusion:No  Physical exam   Vitals:   07/17/21 0527 07/17/21 1513 07/17/21 2220 07/18/21 0500  BP: (!) 112/45 121/69 115/74 118/72  Pulse: 85 78 (!) 107 98  Resp: $Remo'16 16 18 17  'FPldm$ Temp: 98.1 F (36.7 C) 98 F (36.7 C) 98 F (36.7 C) 98.2 F (36.8 C)  TempSrc: Oral Axillary Axillary Axillary  SpO2:    98%  Weight:      Height:       General: alert, cooperative, and no distress Lochia: appropriate Uterine Fundus: firm Incision: Healing well with no significant drainage, No significant erythema, Dressing is clean, dry, and intact DVT Evaluation: No evidence of DVT seen on physical exam. Negative Homan's sign. No cords or calf tenderness. No significant calf/ankle edema. Labs: Lab Results  Component Value Date   WBC 14.2 (H) 07/17/2021   HGB 9.3 (L) 07/17/2021   HCT 28.3 (L) 07/17/2021   MCV 89.6 07/17/2021   PLT 298 07/17/2021      Latest Ref Rng & Units 07/16/2021    8:04 AM  CMP  Creatinine 0.44 - 1.00 mg/dL 0.57     Edinburgh Score:    07/17/2021   12:05 PM  Edinburgh Postnatal Depression Scale Screening Tool  I have been able to laugh and see the funny side of things. 2  I have looked forward with enjoyment to things. 1  I have blamed myself unnecessarily when things went wrong. 3  I have been anxious or worried for no good reason. 2  I  have felt scared or panicky for no good reason. 1  Things have been getting on top of me. 2  I have been so unhappy that I have had difficulty sleeping. 3  I have felt sad or miserable. 2  I have been so unhappy that I have been crying. 2  The thought of harming myself has occurred to me. 0  Edinburgh Postnatal Depression Scale Total 18     After visit meds:  Allergies as of 07/18/2021       Reactions   Fish Allergy Itching, Swelling   Latex Rash   Penicillins Itching, Swelling, Rash   Has patient had a PCN reaction causing immediate rash, facial/tongue/throat swelling, SOB or lightheadedness with hypotension: No Has patient had a PCN reaction causing  severe rash involving mucus membranes or skin necrosis: No Has patient had a PCN reaction that required hospitalization No Has patient had a PCN reaction occurring within the last 10 years: No If all of the above answers are "NO", then may proceed with Cephalosporin use.        Medication List     STOP taking these medications    Accu-Chek Guide w/Device Kit   Accu-Chek Softclix Lancets lancets   aspirin EC 81 MG tablet   cyclobenzaprine 5 MG tablet Commonly known as: FLEXERIL   doxylamine (Sleep) 25 MG tablet Commonly known as: UNISOM   glucose blood test strip   metFORMIN 500 MG tablet Commonly known as: Glucophage   pyridoxine 100 MG tablet Commonly known as: B-6       TAKE these medications    gabapentin 100 MG capsule Commonly known as: NEURONTIN Take 1 capsule (100 mg total) by mouth 3 (three) times daily.   ibuprofen 600 MG tablet Commonly known as: ADVIL Take 1 tablet (600 mg total) by mouth every 6 (six) hours as needed.   oxyCODONE 5 MG immediate release tablet Commonly known as: Oxy IR/ROXICODONE Take 1 tablet (5 mg total) by mouth every 4 (four) hours as needed for moderate pain.   PRENATAL PO Take 1 tablet by mouth daily.         Discharge home in stable condition Infant Feeding: Bottle and Breast Infant Disposition:home with mother Discharge instruction: per After Visit Summary and Postpartum booklet. Activity: Advance as tolerated. Pelvic rest for 6 weeks.  Diet: routine diet Future Appointments:No future appointments. Follow up Visit:  Message sent to [facility] by Dr Higinio Plan:  Please schedule this patient for a In person postpartum visit in 6 weeks with the following provider: Any provider. Additional Postpartum F/U:2 hour GTT and Incision check 1 week  High risk pregnancy complicated by: GDM Delivery mode:  C-Section, Low Transverse  Anticipated Birth Control:  IUD at postpartum visit    07/18/2021 Julianne Handler, CNM

## 2021-07-16 NOTE — Anesthesia Procedure Notes (Signed)
Spinal  Patient location during procedure: OB Start time: 07/16/2021 10:15 AM End time: 07/16/2021 10:21 AM Reason for block: surgical anesthesia Staffing Performed: resident/CRNA and anesthesiologist  Anesthesiologist: Trevor Iha, MD Preanesthetic Checklist Completed: patient identified, IV checked, risks and benefits discussed, surgical consent, monitors and equipment checked, pre-op evaluation and timeout performed Spinal Block Patient position: sitting Prep: DuraPrep and site prepped and draped Patient monitoring: heart rate, cardiac monitor, continuous pulse ox and blood pressure Approach: midline Location: L2-3 Injection technique: single-shot Needle Needle type: Pencan  Needle gauge: 24 G Needle length: 10 cm Needle insertion depth: 7 cm Assessment Sensory level: T4 Events: CSF return Additional Notes  1 Attempt (s). Pt tolerated procedure well. SRNA Bradly Bienenstock Performed

## 2021-07-16 NOTE — Anesthesia Postprocedure Evaluation (Signed)
Anesthesia Post Note  Patient: Kristy Nguyen  Procedure(s) Performed: CESAREAN SECTION     Patient location during evaluation: Mother Baby Anesthesia Type: Spinal Level of consciousness: oriented and awake and alert Pain management: pain level controlled Vital Signs Assessment: post-procedure vital signs reviewed and stable Respiratory status: spontaneous breathing and respiratory function stable Cardiovascular status: blood pressure returned to baseline and stable Postop Assessment: no headache, no backache, no apparent nausea or vomiting and able to ambulate Anesthetic complications: no   No notable events documented.  Last Vitals:  Vitals:   07/16/21 1250 07/16/21 1310  BP: (!) 86/59 109/68  Pulse: 96 93  Resp: 18 18  Temp:  36.8 C  SpO2: 98% 97%    Last Pain:  Vitals:   07/16/21 1310  TempSrc: Oral  PainSc:    Pain Goal:                   Trevor Iha

## 2021-07-16 NOTE — Op Note (Signed)
Kristy Nguyen PROCEDURE DATE: 07/16/2021  PREOPERATIVE DIAGNOSIS: Intrauterine pregnancy at  [redacted]w[redacted]d weeks gestation; macrosomia and history of shoulder dystocia with infant injury  POSTOPERATIVE DIAGNOSIS: The same  PROCEDURE: Primary Low Transverse Cesarean Section  SURGEON:  Dr. Scheryl Darter   ASSISTANT: Dr. Leticia Penna   An experienced assistant was required given the standard of surgical care given the complexity of the case.  This assistant was needed for exposure, dissection, suctioning, retraction, instrument exchange, assisting with delivery with administration of fundal pressure, and for overall help during the procedure.   INDICATIONS: Kristy Nguyen is a 31 y.o. [id number] at [redacted]w[redacted]d scheduled for cesarean section secondary to macrosomia and h/o shoulder dystocia .  The risks of cesarean section discussed with the patient included but were not limited to: bleeding which may require transfusion or reoperation; infection which may require antibiotics; injury to bowel, bladder, ureters or other surrounding organs; injury to the fetus; need for additional procedures including hysterectomy in the event of a life-threatening hemorrhage; placental abnormalities wth subsequent pregnancies, incisional problems, thromboembolic phenomenon and other postoperative/anesthesia complications. The patient concurred with the proposed plan, giving informed written consent for the procedure.    FINDINGS:  Viable female infant in cephalic presentation.  Apgars 8 and 9, weight 4790g.  Clear amniotic fluid.  Intact placenta, three vessel cord.  Normal uterus, fallopian tubes and ovaries bilaterally.  ANESTHESIA:    Spinal INTRAVENOUS FLUIDS: 1,000 ml ESTIMATED BLOOD LOSS: 950 ml URINE OUTPUT:  100 ml SPECIMENS: Placenta sent to L&D COMPLICATIONS: None immediate  PROCEDURE IN DETAIL:  The patient received intravenous antibiotics and had sequential compression devices applied to her lower  extremities while in the preoperative area.  She was then taken to the operating room where spinal anesthesia was administered and was found to be adequate. She was then placed in a dorsal supine position with a leftward tilt, and prepped and draped in a sterile manner.  A foley catheter was placed into her bladder and attached to constant gravity, which drained clear fluid throughout.  After an adequate timeout was performed, a Pfannenstiel skin incision was made with scalpel and carried through to the underlying layer of fascia. The fascia was incised in the midline and this incision was extended bilaterally bluntly. Kocher clamps were applied to the superior aspect of the fascial incision and the underlying rectus muscles were dissected off bluntly/sharply. A similar process was carried out on the inferior aspect of the facial incision. The rectus muscles were separated in the midline bluntly and the peritoneum was entered bluntly. An Alexis retractor was placed to aid in visualization of the uterus.  Attention was turned to the lower uterine segment where a transverse hysterotomy was made with a scalpel and extended bilaterally bluntly. The infant was successfully delivered, and cord was clamped and cut and infant was handed over to awaiting neonatology team.   Uterine massage was then administered and the placenta delivered intact with three-vessel cord. The uterus was then cleared of clot and debris.  The hysterotomy was closed with 0 Vicryl in a running locked fashion, and an imbricating layer was also placed with a 0 Vicryl for hemostasis. Overall, excellent hemostasis was noted. The abdomen and the pelvis were cleared of all clot and debris and the Jon Gills was removed. Hemostasis was confirmed on all surfaces.  The peritoneum was reapproximated using 2-0 vicryl running stitches. The fascia was then closed using 0 Vicryl in a running fashion. The subcutaneous layer was reapproximated  with plain gut and the  skin was closed with 4-0 vicryl. The patient tolerated the procedure well. Sponge, lap, instrument and needle counts were correct x 2. She was taken to the recovery room in stable condition.    Allayne Stack, DO 07/16/2021 11:33 AM

## 2021-07-16 NOTE — Lactation Note (Signed)
This note was copied from a baby's chart. Lactation Consultation Note  Patient Name: Kristy Nguyen [id number] Date: 07/16/2021   Age:31 hours Mom formula feeding only as per RN, [name]  Maternal Data    Feeding Mother's Current Feeding Choice: Formula Nipple Type: Slow - flow  LATCH Score                    Lactation Tools Discussed/Used    Interventions    Discharge    Consult Status Consult Status: Complete (mother declined follow up) (mother formula only)    [name]  [name] 07/16/2021, 3:16 PM

## 2021-07-16 NOTE — Progress Notes (Signed)
Subjective: Postpartum Day 1: Cesarean Delivery Patient reports tolerating PO, + flatus, and + BM.    Objective: Vital signs in last 24 hours: Temp:  [97.7 F (36.5 C)-98.8 F (37.1 C)] 98.3 F (36.8 C) (06/02 2059) Pulse Rate:  [75-119] 85 (06/02 2059) Resp:  [16-23] 17 (06/02 2059) BP: (86-125)/(59-94) 113/62 (06/02 2059) SpO2:  [96 %-99 %] 98 % (06/02 1710) Weight:  [103.4 kg] 103.4 kg (06/02 0810)  Physical Exam:  General: alert, cooperative, and appears stated age Lochia: appropriate Uterine Fundus: firm Incision: healing well, no significant drainage, no significant erythema DVT Evaluation: No evidence of DVT seen on physical exam. No cords or calf tenderness. No significant calf/ankle edema.  Recent Labs    07/16/21 0804  HGB 11.6*  HCT 35.4*    Assessment/Plan: Status post Cesarean section. Doing well postoperatively.  Continue current care.  Kristy Nguyen 07/16/2021, 9:52 PM

## 2021-07-16 NOTE — H&P (Signed)
Obstetric Preoperative History and Physical  Kristy Nguyen is a 31 y.o. G3P1011 with IUP at [redacted]w[redacted]d presenting for scheduled cesarean section.  No acute concerns.   Prenatal Course Source of Care: [facility] to [facility]   Pregnancy complications or risks: --History of shoulder dystocia in first delivery. Infant required 3 years of PT.  --Macrosomia (>99th %), 10 lbs 4 oz at [redacted]w[redacted]d by MFM [place]  --Recurrent E.Coli UTI on suppression  --A2GDM, reasonable control  --Language barrier (spanish)   Patient Active Problem List   Diagnosis Date Noted   Chlamydia trachomatis infection in pregnancy 04/08/2021   LGA (large for gestational age) fetus affecting management of mother 03/30/2021   History of shoulder dystocia in prior pregnancy, currently pregnant 03/30/2021   BMI 40.0-44.9, adult ([place]) 03/01/2021   Language barrier 03/01/2021   Obesity in pregnancy 03/01/2021   Supervision of high risk pregnancy, antepartum 02/22/2021   History of postpartum depression, currently pregnant 02/22/2021   History of oligohydramnios in prior pregnancy, currently pregnant 09/14/2016   History of group B Streptococcus (GBS) infection 09/11/2016   GDM (gestational diabetes mellitus) 08/05/2016   Urinary tract infection in mother during second trimester of pregnancy 07/19/2016   She plans on breast and formula feeding  She desires IUD for postpartum contraception but would like this at her postpartum visit.   Prenatal labs and studies: ABO, Rh: --/--/PENDING (06/02 3419) Antibody: PENDING (06/02 0804) Rubella: Immune (12/15 1415) RPR: Non Reactive (03/06 1434)  HBsAg: Negative (12/15 1415)  HIV: Non Reactive (03/06 1434)  FXT:KWIOXBDZ/-- (05/15 1456) 2 hr Glucola abnormal  Genetic screening normal Anatomy US normal  Prenatal Transfer Tool  Maternal Diabetes: Yes:  Diabetes Type:  Insulin/Medication controlled, Diet controlled Genetic Screening: Normal Maternal Ultrasounds/Referrals: Normal Fetal  Ultrasounds or other Referrals:  None Maternal Substance Abuse:  No Significant Maternal Medications:  None Significant Maternal Lab Results: Group B Strep negative  Past Medical History:  Diagnosis Date   Allergy    Diabetes mellitus without complication (HCC)    gestational   Gestational diabetes    History of shoulder dystocia in prior pregnancy    Nexplanon insertion 10/28/2016   NSVD (normal spontaneous vaginal delivery) 09/17/2016   Supervision of high-risk pregnancy 04/05/2016    [facility] Prenatal Labs  Dating lmp and 12.4 week [place] Blood type: --/--/O POS (02/10 1138)   Genetic Screen 1 Screen:    AFP:     Quad:     NIPS: Antibody: negative  Anatomic [place]  Normal, anterior placenta, female  Rubella:  immune  GTT Early:               Third trimester:  Glucose, Fasting 65 - 91 mg/dL 95    Glucose, 1 hour 65 - 179 mg/dL 192    Glucose, 2 hour 65 - 152 mg/dL 150     RPR:   nega    Past Surgical History:  Procedure Laterality Date   DILATION AND EVACUATION  2010    OB History  Gravida Para Term Preterm AB Living  $Remov'3 1 1 'NOjutx$ 0 1 1  SAB IAB Ectopic Multiple Live Births  1 0 0   1    # Outcome Date GA Lbr Len/2nd Weight Sex Delivery Anes PTL Lv  3 Current           2 Term 09/15/16 [redacted]w[redacted]d / 00:13 3215 g M Vag-Spont EPI  LIV  1 SAB 2010  Social History   Socioeconomic History   Marital status: Single    Spouse name: Not on file   Number of children: Not on file   Years of education: Not on file   Highest education level: 9th grade  Occupational History   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   Social Determinants of Health   Financial Resource Strain: Not on file  Food Insecurity: Food Insecurity Present   Worried About Charity fundraiser in the Last Year: Often true   Arboriculturist in the Last Year: Often true  Transportation Needs: No Transportation Needs   Lack of Transportation (Medical): No   Lack of Transportation (Non-Medical): No  Physical Activity: Not on file  Stress: Not on file  Social Connections: Not on file    Family History  Problem Relation Age of Onset   Hypertension Mother    Diabetes Father    Heart Problems Father    Anxiety disorder Sister    Depression Sister    Cancer Paternal Uncle        Brain   Diabetes Maternal Grandmother    Diabetes Maternal Grandfather    Diabetes Paternal Grandmother    Diabetes Paternal Grandfather     Medications Prior to Admission  Medication Sig Dispense Refill Last Dose   aspirin EC 81 MG tablet Take 1 tablet (81 mg total) by mouth daily. 60 tablet 1 Past Week   doxylamine, Sleep, (UNISOM) 25 MG tablet Take 25 mg by mouth at bedtime.   07/15/2021 at 2000   Prenatal Vit-Fe Fumarate-FA (PRENATAL PO) Take 1 tablet by mouth daily.   07/15/2021 at 2000   pyridoxine (B-6) 100 MG tablet Take 100 mg by mouth daily.   07/15/2021 at 2000   Accu-Chek Softclix Lancets lancets Use as instructed 100 each 12    Blood Glucose Monitoring Suppl (ACCU-CHEK GUIDE) w/Device KIT 1 Device by Does not apply route in the morning, at noon, in the evening, and at bedtime. (Patient not taking: Reported on 05/31/2021) 1 kit 0    cyclobenzaprine (FLEXERIL) 5 MG tablet Take 1 tablet (5 mg total) by mouth 3 (three) times daily as needed for muscle spasms. (Patient not taking: Reported on 07/09/2021) 15 tablet 0 Not Taking   glucose blood test strip Use as instructed (Patient not taking: Reported on 05/31/2021) 100 each 12    metFORMIN  (GLUCOPHAGE) 500 MG tablet Take 1 tablet (500 mg total) by mouth 2 (two) times daily with a meal. (Patient not taking: Reported on 05/31/2021) 180 tablet 2 Not Taking    Allergies  Allergen Reactions   Fish Allergy Itching and Swelling   Latex Rash   Penicillins Itching, Swelling and Rash    Has patient had a PCN reaction causing immediate rash, facial/tongue/throat swelling, SOB or lightheadedness with hypotension: No Has patient had a PCN reaction causing severe rash involving mucus  membranes or skin necrosis: No Has patient had a PCN reaction that required hospitalization No Has patient had a PCN reaction occurring within the last 10 years: No If all of the above answers are "NO", then may proceed with Cephalosporin use.    Review of Systems: Negative except for what is mentioned in HPI.  Physical Exam: BP (!) 125/94 (BP Location: Right Arm)   Pulse (!) 119   Temp 98.8 F (37.1 C) (Oral)   Resp 16   Ht 5' (1.524 m)   Wt 103.4 kg   LMP 10/16/2020   SpO2 99%   BMI 44.53 kg/m  FHR by Doppler: 147 bpm CONSTITUTIONAL: Well-developed, well-nourished female in no acute distress.  HENT:  Normocephalic, atraumatic. Oropharynx is clear and moist EYES: Conjunctivae and EOM are normal. No scleral icterus.  NECK: Normal range of motion, supple SKIN: Skin is warm and dry. No rash noted. Not diaphoretic. No erythema. [place]: Alert and oriented to person, place, and time. Normal reflexes, muscle tone coordination. No cranial nerve deficit noted. PSYCHIATRIC: Normal mood and affect. Normal behavior. CARDIOVASCULAR: Normal heart rate noted RESPIRATORY: Effort normal, no problems with respiration noted ABDOMEN: Soft, nontender, nondistended, gravid.  PELVIC: Deferred MUSCULOSKELETAL: Normal range of motion. No edema and no tenderness. 2+ distal pulses.   Pertinent Labs/Studies:   Results for orders placed or performed during the hospital encounter of 07/16/21 (from the past 72 hour(s))   Type and screen     Status: None (Preliminary result)   Collection Time: 07/16/21  8:04 AM  Result Value Ref Range   ABO/RH(D) PENDING    Antibody Screen PENDING    Sample Expiration      07/19/2021,2359 Performed at [facility], [place] [address], [place], [place] [address]   Glucose, capillary     Status: Abnormal   Collection Time: 07/16/21  8:09 AM  Result Value Ref Range   Glucose-Capillary 114 (H) 70 - 99 mg/dL    Comment: Glucose reference range applies only to samples taken after fasting for at least 8 hours.   Comment 1 Notify RN    Comment 2 Document in Chart     Assessment and Plan :Shelanda Duvall is a 31 y.o. G3P1011 at [redacted]w[redacted]d being admitted for scheduled cesarean section. The risks of cesarean section discussed with the patient included but were not limited to: bleeding which may require transfusion or reoperation; infection which may require antibiotics; injury to bowel, bladder, ureters or other surrounding organs; injury to the fetus; need for additional procedures including hysterectomy in the event of a life-threatening hemorrhage; placental abnormalities wth subsequent pregnancies, incisional problems, thromboembolic phenomenon and other postoperative/anesthesia complications. The patient concurred with the proposed plan, giving informed written consent for the procedure. Patient has been NPO since last night she will remain NPO for procedure. Anesthesia and OR aware. Preoperative prophylactic antibiotics and SCDs ordered on call to the OR. To OR when ready.   #Contraception: IUD at her postpartum visit per request  #Circ: Unsure, will discuss pp.   #A2GDM: 2 hour GTT at postpartum visit.   Darrelyn Hillock, D.O. OB Fellow  07/16/2021, 8:35 AM

## 2021-07-17 LAB — CBC
HCT: 28.3 % — ABNORMAL LOW (ref 36.0–46.0)
Hemoglobin: 9.3 g/dL — ABNORMAL LOW (ref 12.0–15.0)
MCH: 29.4 pg (ref 26.0–34.0)
MCHC: 32.9 g/dL (ref 30.0–36.0)
MCV: 89.6 fL (ref 80.0–100.0)
Platelets: 298 10*3/uL (ref 150–400)
RBC: 3.16 MIL/uL — ABNORMAL LOW (ref 3.87–5.11)
RDW: 13.3 % (ref 11.5–15.5)
WBC: 14.2 10*3/uL — ABNORMAL HIGH (ref 4.0–10.5)
nRBC: 0 % (ref 0.0–0.2)

## 2021-07-17 LAB — BIRTH TISSUE RECOVERY COLLECTION (PLACENTA DONATION)

## 2021-07-17 MED ORDER — FERROUS SULFATE 325 (65 FE) MG PO TABS
325.0000 mg | ORAL_TABLET | ORAL | Status: DC
  Administered 2021-07-17: 325 mg via ORAL
  Filled 2021-07-17: qty 1

## 2021-07-17 NOTE — Progress Notes (Signed)
CSW received consult due to score 18 on Edinburgh Depression Screen and hx of PPD. When CSW arrived, MOB was resting in bed and FOB was in the recliner observing infant while infant was asleep in the bassinet; Everyone appeared happy and comfortable.   CSW explained CSW's role and MOB gave CSW permission to complete assessment while FOB was present. MOB was presented as tired but was polite, easy to engage, and was receptive to meeting with CSW.  FOB also engaged with CSW.   MOB denied MH hx however communicated feeling "Really sad and different a month after I had my first baby."  Per MOB, she informed her providers and she was monitored. CSW reviewed MOB's Edinburgh Score and assessed for safety; MOB denied SI and HI and reported feeling comfortable seeking help if needed. CSW offered MOB resources for outpatient counseling and MOB declined.   CSW provided education regarding Baby Blues vs PMADs and provided MOB with resources for mental health follow up.  CSW encouraged MOB to evaluate her mental health throughout the postpartum period with the use of the New Mom Checklist developed by Postpartum Progress as well as the Edinburgh Postnatal Depression Scale and notify a medical professional if symptoms arise.  MOB presented with insight and awareness and did not demonstrate any acute MH symptoms.   There are no barriers to discharge.   Nikolis Berent Boyd-Gilyard, MSW, LCSW Clinical Social Work [phone] 

## 2021-07-18 MED ORDER — OXYCODONE HCL 5 MG PO TABS
5.0000 mg | ORAL_TABLET | ORAL | 0 refills | Status: DC | PRN
Start: 2021-07-18 — End: 2021-12-14

## 2021-07-18 MED ORDER — IBUPROFEN 600 MG PO TABS: 600.0000 mg | ORAL_TABLET | Freq: Four times a day (QID) | ORAL | 0 refills | Status: DC | PRN

## 2021-07-18 MED ORDER — GABAPENTIN 100 MG PO CAPS: 100.0000 mg | ORAL_CAPSULE | Freq: Three times a day (TID) | ORAL | 0 refills | Status: DC

## 2021-07-18 MED ORDER — GABAPENTIN 100 MG PO CAPS
100.0000 mg | ORAL_CAPSULE | Freq: Once | ORAL | Status: AC
  Administered 2021-07-18: 100 mg via ORAL
  Filled 2021-07-18: qty 1

## 2021-07-18 MED ORDER — OXYCODONE HCL 5 MG PO TABS
5.0000 mg | ORAL_TABLET | Freq: Once | ORAL | Status: AC
  Administered 2021-07-18: 5 mg via ORAL
  Filled 2021-07-18: qty 1

## 2021-07-18 NOTE — Lactation Note (Signed)
This note was copied from a baby's chart. Lactation Consultation Note  Patient Name: Kristy Nguyen [id number] Date: 07/18/2021 Reason for consult: Follow-up assessment;Term Age:31 hours   LC Note:  Per previous LC note, mother was formula feeding only.  Day shift RN requested latch assistance.  Mother's feeding preference is breast/formula.  When I arrived, RN had assisted with latching baby to the breast.  He had been feeding for approximately 20 minutes prior to my arrival and appeared sleepy at the breast.  Reviewed breast feeding basics with mother.  Since he has consumed large amounts of formula I suggested she now begin to latch with every feeding prior to giving formula supplementation.  Explained the benefits of STS, breast massage and hand expression to help establish a milk supply.  Demonstrated gentle stimulation to keep him actively engaged with his feeding.  Asked mother to remove him from the breast if he is not feeding effectively and to place him STS.  Mother verbalized understanding.  She has our OP phone number for any questions after discharge.  No support person present at this time.     Maternal Data    Feeding Mother's Current Feeding Choice: Breast Milk and Formula  LATCH Score Latch: Repeated attempts needed to sustain latch, nipple held in mouth throughout feeding, stimulation needed to elicit sucking reflex.  Audible Swallowing: A few with stimulation  Type of Nipple: Everted at rest and after stimulation  Comfort (Breast/Nipple): Filling, red/small blisters or bruises, mild/mod discomfort  Hold (Positioning): Assistance needed to correctly position infant at breast and maintain latch.  LATCH Score: 6   Lactation Tools Discussed/Used    Interventions    Discharge Discharge Education: Engorgement and breast care Pump: Manual  Consult Status Consult Status: Complete    Ojani Berenson R Reg Bircher 07/18/2021, 9:07 AM

## 2021-07-19 ENCOUNTER — Encounter: Payer: BC Managed Care – PPO | Admitting: Obstetrics & Gynecology

## 2021-07-19 ENCOUNTER — Other Ambulatory Visit: Payer: Self-pay

## 2021-07-23 ENCOUNTER — Encounter: Payer: Self-pay | Admitting: Radiology

## 2021-07-26 ENCOUNTER — Other Ambulatory Visit: Payer: Self-pay

## 2021-07-26 ENCOUNTER — Telehealth (HOSPITAL_COMMUNITY): Payer: Self-pay | Admitting: *Deleted

## 2021-07-26 DIAGNOSIS — Z1331 Encounter for screening for depression: Secondary | ICD-10-CM

## 2021-07-26 NOTE — Telephone Encounter (Signed)
Accidental open of two phone encounters.  Duffy Rhody, RN 07-26-2021 at 4;33pm

## 2021-07-26 NOTE — Telephone Encounter (Signed)
Two different interpreters called patient with 2 disconnected calls. Mom reports chills and feeling cold at night with normal temperature. Honeycomb not removed yet. Patient thought it would be removed at appt. Unable to complete EPDS, but hospital EPDS=18. Ambulatory IBH referral made and Dr. Dione Plover notified via chart.  Odis Hollingshead, RN 07-26-2021 at 4:36pm

## 2021-07-27 ENCOUNTER — Ambulatory Visit: Payer: BC Managed Care – PPO

## 2021-07-29 ENCOUNTER — Telehealth: Payer: Self-pay | Admitting: Clinical

## 2021-07-29 NOTE — Telephone Encounter (Signed)
Via Spanish interpreter, Octavia, [id number], attempt to call regarding referral; Left HIPPA-compliant message to call back Asher Muir from Center for [employer] at [facility] at  [phone] [facility] office).

## 2021-08-23 NOTE — Progress Notes (Deleted)
Post Partum Visit Note  Kristy Nguyen is a 31 y.o. [phone] female who presents for a postpartum visit. She is 6 week postpartum following a primary cesarean section.  I have fully reviewed the prenatal and intrapartum course. The delivery was at 39 gestational weeks.  Anesthesia: regional. Postpartum course has been ***. Baby is doing well***. Baby is feeding by {breastmilk/bottle:69}. Bleeding {vag bleed:12292}. Bowel function is {normal:32111}. Bladder function is {normal:32111}. Patient {is/is not:9024} sexually active. Contraception method is {contraceptive method:5051}. Postpartum depression screening: {gen negative/positive:315881}.   The pregnancy intention screening data noted above was reviewed. Potential methods of contraception were discussed. The patient elected to proceed with No data recorded.    Health Maintenance Due  Topic Date Due   COVID-19 Vaccine (1) Never done   URINE MICROALBUMIN  Never done    The following portions of the patient's history were reviewed and updated as appropriate: allergies, current medications, past family history, past medical history, past social history, past surgical history, and problem list.  Review of Systems {ros; complete:30496}  Objective:  There were no vitals taken for this visit.   General:  {gen appearance:16600}   Breasts:  {desc; normal/abnormal/not indicated:14647}  Lungs: {lung exam:16931}  Heart:  {heart exam:5510}  Abdomen: {abdomen exam:16834}   Wound {Wound assessment:11097}  GU exam:  {desc; normal/abnormal/not indicated:14647}        GYNECOLOGY OFFICE PROCEDURE NOTE  Kristy Nguyen is a 31 y.o. [id number] here for IUD insertion. No GYN concerns.  Last pap smear was on 01/2021 and was normal at [facility]  IUD Insertion Procedure Note Procedure: IUD insertion with {Blank single:19197::"Mirena","Skyla","Liletta","Kyleena","Paragard"} UPT: {Blank single:19197::"Negative"} GC/CT testing: {Blank  single:19197::"Up to date","Offered and declines","Offered and accepts"}  Patient identified.  Risks, benefits and alternatives of procedure were discussed including irregular bleeding, cramping, infection, malpositioning or misplacement of the IUD outside the uterus which may require further procedure such as laparoscopy. Also discussed >99% contraception efficacy, increased risk of ectopic pregnancy with failure of method.   Emphasized that this did not protect against STIs, condoms recommended during all sexual encounters. Consent signed. Time out performed.   Speculum inserted and cervix visualized, prepped with 3 swabs of betadine.   {Blank multiple:19196::"Grasped with a single tooth tenaculum. ","Uterus sounded to *** cm. ","Cervical dilators used. ","IUD then inserted without difficulty per manufacturer's instructions and strings cut to 3 cm below cervical os and all instruments removed. Pt tolerated well with minimal pain and bleeding."}   Discussed concerning signs/symptoms and to call if heavy bleeding, severe abdominal pain, or fever in the following 3 weeks. Manufacturer pamphlet/patient information given. Reviewed timing of efficacy for contraception and to use an alternative form of birth control until that time.   Assessment:    1. Postpartum care following cesarean delivery Routine PP care  2. Language barrier Spanish interpreter used through  3. History of gestational diabetes Needs 2hr gtt ***   6wk postpartum exam.   Plan:   Essential components of care per ACOG recommendations:  1.  Mood and well being: Patient with {gen negative/positive:315881} depression screening today. Reviewed local resources for support.  - Patient tobacco use? Yes. {desire to quit:25509} *** - hx of drug use? No.    2. Infant care and feeding:  -Patient currently breastmilk feeding? {yes/no:25502}  -Social determinants of health (SDOH) reviewed in EPIC. No concerns***The following needs  were identified***  3. Sexuality, contraception and birth spacing - Patient {DOES_DOES VOJ:50093} want a pregnancy in the next  year.  Desired family size is {NUMBER 1-10:22536} children.  - Reviewed reproductive life planning. Reviewed contraceptive methods based on pt preferences and effectiveness.  Patient desired {Upstream End Methods:24109} today.   - Discussed birth spacing of 18 months  4. Sleep and fatigue -Encouraged family/partner/community support of 4 hrs of uninterrupted sleep to help with mood and fatigue  5. Physical Recovery  - Discussed patients delivery and complications. She describes her labor as {description:25511} - Patient had a C-section.  - Patient has urinary incontinence? {yes/no:25515} - Patient {ACTION; IS/IS ONG:29528413} safe to resume physical and sexual activity  6.  Health Maintenance - HM due items addressed Yes - Last pap smear: 01/2021 which was normal, HPV Negative. Done at [facility]  Pap smear not done at today's visit.  -Breast Cancer screening indicated? No.   7. Chronic Disease/Pregnancy Condition follow up: Gestational Diabetes  - PCP follow up  Milas Hock, MD Center for [facility], [facility]

## 2021-08-25 ENCOUNTER — Ambulatory Visit: Payer: Self-pay | Admitting: Obstetrics and Gynecology

## 2021-08-25 DIAGNOSIS — Z789 Other specified health status: Secondary | ICD-10-CM

## 2021-08-25 DIAGNOSIS — Z8632 Personal history of gestational diabetes: Secondary | ICD-10-CM

## 2021-08-25 NOTE — BH Specialist Note (Signed)
Integrated Behavioral Health via Telemedicine Visit  09/03/2021 Kristy Nguyen [id number]  Number of Integrated Behavioral Health Clinician visits: 1- Initial Visit  Session Start time: 0812   Session End time: 0910  Total time in minutes: 58   Referring Provider: Merian Capron, MD Patient/Family location: Home [facility] Provider location: Center for [facility] at [facility]  All persons participating in visit: Patient Kristy Nguyen and [facility] Kristy Nguyen   Types of Service: Individual psychotherapy and Telephone visit  I connected with Kristy Nguyen and/or Kristy Nguyen's  n/a  via  Telephone or [employer]  (Video is Surveyor, mining) and verified that I am speaking with the correct person using two identifiers. Discussed confidentiality: Yes   I discussed the limitations of telemedicine and the availability of in person appointments.  Discussed there is a possibility of technology failure and discussed alternative modes of communication if that failure occurs.  I discussed that engaging in this telemedicine visit, they consent to the provision of behavioral healthcare and the services will be billed under their insurance.  Patient and/or legal guardian expressed understanding and consented to Telemedicine visit: Yes   Presenting Concerns: Patient and/or family reports the following symptoms/concerns: Increase in depressed mood while adjusting to overwhelming life changes: Was fired from job (that she worked for 7 years) on day she had cesarean (because FMLA form received too late), impending eviction due to job loss; FOB left her day after she had baby. Pt concerned she missed postpartum visit, as she had fever, chills, back pain; fever and chills stopped two days prior. Receives [facility], but hasn't received EBT card yet.  Duration of problem: Postpartum due to all above; Severity of problem:  moderate  Patient and/or Family's Strengths/Protective Factors: Sense of purpose  Goals Addressed: Patient will:  Reduce symptoms of: depression and stress   Increase knowledge and/or ability of: healthy habits and stress reduction   Demonstrate ability to: Increase healthy adjustment to current life circumstances, Increase adequate support systems for patient/family, and Increase motivation to adhere to plan of care  Progress towards Goals: Ongoing  Interventions: Interventions utilized:  Solution-Focused Strategies, Psychoeducation and/or Health Education, and Link to [employer] Standardized Assessments completed: GAD-7 and PHQ 9  Patient and/or Family Response: Patient agrees with treatment plan.   Assessment: Patient currently experiencing Adjustment disorder with depressed mood and Psychosocial stress.   Patient may benefit from psychoeducation and brief therapeutic interventions regarding coping with symptoms of depression and life stress .  Plan: Follow up with behavioral health clinician on : Two weeks Behavioral recommendations:  -Continue prioritizing healthy self-care daily for next two weeks (regular meals and adequate sleep) -Accept referral to [employer] -Go to Urgent Care, as recommended by medical provider; postpartum visit has been rescheduled for September 22, 2021 -Continue plan to attend school tour today to help make decision about career change in future Referral(s): Integrated Art gallery manager (In Clinic) and [employer]:  Food, Housing, and new mom support  I discussed the assessment and treatment plan with the patient and/or parent/guardian. They were provided an opportunity to ask questions and all were answered. They agreed with the plan and demonstrated an understanding of the instructions.   They were advised to call back or seek an in-person evaluation if the symptoms worsen or if the condition fails to improve as  anticipated.  [name], LCSW      09/03/2021    8:28  AM 06/28/2021    2:36 PM 05/18/2021    8:46 AM 04/19/2021    1:53 PM 04/08/2021    1:48 PM  Depression screen PHQ 2/9  Decreased Interest 1 1 1 1  0  Down, Depressed, Hopeless 0 1 1 1 1   PHQ - 2 Score 1 2 2 2 1   Altered sleeping 3 1 1 1 1   Tired, decreased energy 3 1 1 1 1   Change in appetite 0 0 0 1 2  Feeling bad or failure about yourself  0 2 0 0 0  Trouble concentrating 0 1 0 1 1  Moving slowly or fidgety/restless 0 0 0 0 0  Suicidal thoughts 0 0 0 1 0  PHQ-9 Score 7 7 4 7 6       09/03/2021    8:37 AM 06/28/2021    3:24 PM 05/18/2021    8:46 AM 04/19/2021    1:53 PM  GAD 7 : Generalized Anxiety Score  Nervous, Anxious, on Edge 0 1 1 1   Control/stop worrying 0 1 1 1   Worry too much - different things 3 1 1 1   Trouble relaxing 0 1 1 1   Restless 0 1 0 0  Easily annoyed or irritable 3 1 1 1   Afraid - awful might happen 0  0   Total GAD 7 Score 6  5

## 2021-09-03 ENCOUNTER — Ambulatory Visit (INDEPENDENT_AMBULATORY_CARE_PROVIDER_SITE_OTHER): Payer: Medicaid Other | Admitting: Clinical

## 2021-09-03 DIAGNOSIS — F4321 Adjustment disorder with depressed mood: Secondary | ICD-10-CM

## 2021-09-03 DIAGNOSIS — Z658 Other specified problems related to psychosocial circumstances: Secondary | ICD-10-CM

## 2021-09-08 NOTE — BH Specialist Note (Deleted)
Integrated Behavioral Health Follow Up In-Person Visit  MRN: [id number] Name: Kristy Nguyen  Number of Integrated Behavioral Health Clinician visits: 1- Initial Visit  Session Start time: [id number]   Session End time: 0910  Total time in minutes: 58   Types of Service: Individual psychotherapy  Interpretor:Yes.   Interpretor Name and Language: Spanish, ***, ***  Subjective: Kristy Nguyen is a 31 y.o. female accompanied by  n/a Patient was referred by Merian Capron, MD for positive depression screening. Patient reports the following symptoms/concerns: *** Duration of problem: Postpartum; Severity of problem: moderate  Objective: Mood: {BHH MOOD:22306} and Affect: {BHH AFFECT:22307} Risk of harm to self or others: {CHL AMB BH Suicide Current Mental Status:21022748}  Life Context: Family and Social: *** School/Work: *** Self-Care: *** Life Changes: Childbirth and job loss, followed by abandonment by baby's father  Patient and/or Family's Strengths/Protective Factors: {CHL AMB BH PROTECTIVE [phone]}  Goals Addressed: Patient will:  Reduce symptoms of: depression and stress   Increase knowledge and/or ability of: {IBH Patient Tools:21014057}   Demonstrate ability to: {IBH Goals:21014053}  Progress towards Goals: Ongoing  Interventions: Interventions utilized:  {IBH Interventions:21014054} Standardized Assessments completed: {IBH Screening Tools:21014051}  Patient and/or Family Response: Patient agrees with treatment plan.   Patient Centered Plan: Patient is on the following Treatment Plan(s): IBH Assessment: Patient currently experiencing Adjustment disorder with depressed mood and Psychosocial stress.   Patient may benefit from continued therapeutic interventions.  Plan: Follow up with behavioral health clinician on : *** Behavioral recommendations:  -*** -self care, brito, school tour for self?, *** Referral(s): {IBH  Referrals:21014055}  Cavon Nicolls C Pau Banh, LCSW

## 2021-09-10 ENCOUNTER — Telehealth: Payer: Self-pay

## 2021-09-10 NOTE — Telephone Encounter (Signed)
Attempted to call patient regarding referral to [facility] for food insecurity, but patient did not answer.  Could not leave message due to voicemail being full. Will try to contact patient again within the week.

## 2021-09-15 ENCOUNTER — Telehealth: Payer: Self-pay

## 2021-09-15 NOTE — Telephone Encounter (Signed)
Spoke with patient on 09/14/2021 regarding referral. Patient stated that she will stop by the [facility] after her visit on 09/22/2021. Informed patient that she will need to let staff know once she completes her visit. Patient stated understanding. No additional needs at this time.   Closing referral pending any other needs of patient.    Danelle Berry [facility], [facility] [address] [place], [place] [address] Ph: [phone] Email: [email or web address]

## 2021-09-22 ENCOUNTER — Ambulatory Visit: Payer: Medicaid Other | Admitting: Obstetrics and Gynecology

## 2021-09-22 ENCOUNTER — Ambulatory Visit: Payer: Medicaid Other

## 2021-10-20 ENCOUNTER — Ambulatory Visit: Payer: BC Managed Care – PPO | Admitting: Nurse Practitioner

## 2021-10-25 ENCOUNTER — Inpatient Hospital Stay (HOSPITAL_COMMUNITY)
Admission: AD | Admit: 2021-10-25 | Discharge: 2021-10-25 | Disposition: A | Discharge status: Home / Self Care | Payer: Medicaid Other | Attending: Obstetrics & Gynecology | Admitting: Obstetrics & Gynecology

## 2021-10-25 DIAGNOSIS — Z789 Other specified health status: Secondary | ICD-10-CM | POA: Diagnosis not present

## 2021-10-25 DIAGNOSIS — Z3202 Encounter for pregnancy test, result negative: Secondary | ICD-10-CM | POA: Insufficient documentation

## 2021-10-25 DIAGNOSIS — Z98891 History of uterine scar from previous surgery: Secondary | ICD-10-CM | POA: Diagnosis not present

## 2021-10-25 LAB — POCT PREGNANCY, URINE: Preg Test, Ur: NEGATIVE

## 2021-10-25 NOTE — MAU Provider Note (Signed)
Event Date/Time   First Provider Initiated Contact with Patient 10/25/21 1248      S Ms. Kristy Nguyen is a 31 y.o. [phone] patient who presents to MAU today with complaint of lower abdominal pain and concern for new pregnancy. Pain is behind her incision. She denies specific complaints about her incision. She denies poor healing, bruising, swelling or drainage. She is bottle feeding her baby.  She has not obtained a positive home UPT. She reports LMP of 10/03/2021 but states it was not a normal period. Previous LMP was 09/02/2021 but she reports using Plan B around 09/07/2021. She is s/p cesarean birth 07/16/2021. She states she was fired the day she presented for her cesarean and was unable to attend her post-op appointments due to lack of transportation. She requests incision exam as she has concerns about its healing.   O BP 131/77 (BP Location: Right Arm)   Pulse 93   Temp 98.1 F (36.7 C) (Oral)   Resp 18   Ht 5' (1.524 m)   Wt 100.3 kg   LMP 10/03/2021   SpO2 98%   BMI 43.18 kg/m    Physical Exam Vitals and nursing note reviewed. Exam conducted with a chaperone present.  Constitutional:      Appearance: She is well-developed.  Cardiovascular:     Rate and Rhythm: Normal rate and regular rhythm.     Heart sounds: Normal heart sounds.  Pulmonary:     Effort: Pulmonary effort is normal.     Breath sounds: Normal breath sounds.  Abdominal:     Palpations: Abdomen is soft.     Tenderness: There is no abdominal tenderness. There is no guarding.  Skin:    Capillary Refill: Capillary refill takes less than 2 seconds.     Comments: LTCS is completely healed, smooth, non-tender, well-approximated.   Neurological:     Mental Status: She is alert and oriented to person, place, and time.  Psychiatric:        Mood and Affect: Mood normal.        Behavior: Behavior normal.     A Medical screening exam complete Negative UPT in MAU LMP 10/03/2021 Hx cesarean delivery  07/16/2021, incision well-healed  P Discharge from MAU in stable condition Patient given the option of transfer to [facility] for further evaluation or seek care in outpatient facility of choice  List of options for follow-up given  Warning signs for worsening condition that would warrant emergency follow-up discussed  F/U: Patient to call [facility] to schedule outpatient evaluation  Calvert Cantor, CNM 10/25/2021 1:21 PM

## 2021-10-25 NOTE — MAU Note (Signed)
Kristy Nguyen is a 31 y.o. at Unknown here in MAU reporting: this morning had pain in lower abd, felt like a contraction.  Still having pain, but not as bad.  No bleeding LMP: small amt of bleeding Aug 20, 3 days Onset of complaint: earlier this morning Pain score: 5 Vitals:   10/25/21 1221  BP: 131/77  Pulse: 93  Resp: 18  Temp: 98.1 F (36.7 C)  SpO2: 98%      Lab orders placed from triage:  UPT, UA if +  Not using birth control, but not trying to get preg.

## 2021-11-04 NOTE — Telephone Encounter (Signed)
Open in error

## 2021-12-14 ENCOUNTER — Ambulatory Visit (INDEPENDENT_AMBULATORY_CARE_PROVIDER_SITE_OTHER): Payer: Medicaid Other | Admitting: Advanced Practice Midwife

## 2021-12-14 ENCOUNTER — Encounter: Payer: Self-pay | Admitting: Family Medicine

## 2021-12-14 ENCOUNTER — Other Ambulatory Visit (HOSPITAL_COMMUNITY)
Admission: RE | Admit: 2021-12-14 | Discharge: 2021-12-14 | Disposition: A | Discharge status: Home / Self Care | Payer: Medicaid Other | Source: Ambulatory Visit | Attending: Obstetrics and Gynecology | Admitting: Obstetrics and Gynecology

## 2021-12-14 VITALS — BP 131/89 | HR 92 | Wt 228.2 lb

## 2021-12-14 DIAGNOSIS — Z6841 Body Mass Index (BMI) 40.0 and over, adult: Secondary | ICD-10-CM

## 2021-12-14 DIAGNOSIS — Z113 Encounter for screening for infections with a predominantly sexual mode of transmission: Secondary | ICD-10-CM | POA: Insufficient documentation

## 2021-12-14 DIAGNOSIS — Z30430 Encounter for insertion of intrauterine contraceptive device: Secondary | ICD-10-CM

## 2021-12-14 DIAGNOSIS — Z3202 Encounter for pregnancy test, result negative: Secondary | ICD-10-CM

## 2021-12-14 DIAGNOSIS — B9689 Other specified bacterial agents as the cause of diseases classified elsewhere: Secondary | ICD-10-CM

## 2021-12-14 LAB — POCT PREGNANCY, URINE: Preg Test, Ur: NEGATIVE

## 2021-12-14 MED ORDER — IBUPROFEN 600 MG PO TABS: 600.0000 mg | ORAL_TABLET | Freq: Four times a day (QID) | ORAL | 1 refills | Status: AC | PRN

## 2021-12-14 MED ORDER — PARAGARD INTRAUTERINE COPPER IU IUD
1.0000 | INTRAUTERINE_SYSTEM | Freq: Once | INTRAUTERINE | Status: AC
  Administered 2021-12-14: 1 via INTRAUTERINE

## 2021-12-15 LAB — CERVICOVAGINAL ANCILLARY ONLY
Bacterial Vaginitis (gardnerella): POSITIVE — AB
Candida Glabrata: NEGATIVE
Candida Vaginitis: NEGATIVE
Chlamydia: NEGATIVE
Comment: NEGATIVE
Comment: NEGATIVE
Comment: NEGATIVE
Comment: NEGATIVE
Comment: NEGATIVE
Comment: NORMAL
Neisseria Gonorrhea: NEGATIVE
Trichomonas: NEGATIVE

## 2021-12-19 MED ORDER — METRONIDAZOLE 500 MG PO TABS: 500.0000 mg | ORAL_TABLET | Freq: Two times a day (BID) | ORAL | 0 refills | Status: DC

## 2021-12-20 ENCOUNTER — Encounter: Payer: Self-pay | Admitting: General Practice

## 2021-12-20 DIAGNOSIS — Z30430 Encounter for insertion of intrauterine contraceptive device: Secondary | ICD-10-CM | POA: Insufficient documentation

## 2021-12-20 NOTE — Progress Notes (Signed)
Procedure note:  Pt requests ParaGard. Discussed possible heavy periods and pro and cons of Levonorgestrel IUDs. Pt has had ParaGard in the past and did well with it. Prefers no hormones. Patient identified, informed consent performed, signed copy in chart, time out was performed.  Urine pregnancy test negative.  Speculum placed in the vagina.  Cervix visualized.  Cleaned with Betadine x 2.  Grasped anteriourly/posteriorly with a single tooth tenaculum.  Uterus sounded to 8 cm.  ParaGard IUD placed per manufacturer's recommendations.  Strings trimmed to 3 cm. Pt tolerated procedure well.  Patient given post procedure instructions and IUD care card with expiration date.  Patient is asked to check IUD strings periodically and follow up in 4-6 weeks for IUD check.  Requested GC/Chlamydia/BV/Yeast/Trichomonas testing. Declined Blood tests for STIs. Denies concern for exposure and Sx if STIs. Discussed that ParaGard does not protect against STIs. Recommend condoms for STI prevention. Will Tx PRN.   Manya Silvas, [place] for [employer]

## 2022-01-18 ENCOUNTER — Emergency Department (HOSPITAL_BASED_OUTPATIENT_CLINIC_OR_DEPARTMENT_OTHER): Payer: Medicaid Other

## 2022-01-18 ENCOUNTER — Encounter (HOSPITAL_BASED_OUTPATIENT_CLINIC_OR_DEPARTMENT_OTHER): Payer: Self-pay

## 2022-01-18 ENCOUNTER — Emergency Department (HOSPITAL_BASED_OUTPATIENT_CLINIC_OR_DEPARTMENT_OTHER)
Admission: EM | Admit: 2022-01-18 | Discharge: 2022-01-18 | Disposition: A | Discharge status: Home / Self Care | Payer: Medicaid Other | Attending: Emergency Medicine | Admitting: Emergency Medicine

## 2022-01-18 DIAGNOSIS — Z91040 Latex allergy status: Secondary | ICD-10-CM | POA: Diagnosis not present

## 2022-01-18 DIAGNOSIS — Z20822 Contact with and (suspected) exposure to covid-19: Secondary | ICD-10-CM | POA: Diagnosis not present

## 2022-01-18 DIAGNOSIS — R059 Cough, unspecified: Secondary | ICD-10-CM | POA: Diagnosis present

## 2022-01-18 DIAGNOSIS — R051 Acute cough: Secondary | ICD-10-CM | POA: Diagnosis not present

## 2022-01-18 LAB — RESP PANEL BY RT-PCR (FLU A&B, COVID) ARPGX2
Influenza A by PCR: NEGATIVE
Influenza B by PCR: NEGATIVE
SARS Coronavirus 2 by RT PCR: NEGATIVE

## 2022-01-18 NOTE — ED Notes (Signed)
Pt states her son had tested positive for RSV and now she and her son have coughs and colds feeling bad and tired

## 2022-01-18 NOTE — ED Provider Notes (Signed)
[facility] EMERGENCY DEPARTMENT Provider Note   CSN: [id number] Arrival date & time: 01/18/22  0754     History  Chief Complaint  Patient presents with   Cough    Margaretmary Lombard X Valynn Schamberger is a 31 y.o. female.  Patient here with cough.  Son diagnosed with RSV recently.  Her other child now with similar symptoms.  She has not had any fevers or chills.  She has had some GI symptoms but not having any current abdominal pain, chest pain or shortness of breath.  No major medical problems otherwise.  The history is provided by the patient.       Home Medications Prior to Admission medications   Medication Sig Start Date End Date Taking? Authorizing Provider  ibuprofen (ADVIL) 600 MG tablet Take 1 tablet (600 mg total) by mouth every 6 (six) hours as needed. 12/14/21   Katrinka Blazing, [place], CNM  metroNIDAZOLE (FLAGYL) 500 MG tablet Take 1 tablet (500 mg total) by mouth 2 (two) times daily. 12/19/21   Katrinka Blazing, [place], CNM      Allergies    Fish allergy, Latex, and Penicillins    Review of Systems   Review of Systems  Physical Exam Updated Vital Signs BP (!) 130/93 (BP Location: Right Arm)   Pulse 97   Temp 98 F (36.7 C) (Oral)   Resp 18   Ht 5' (1.524 m)   Wt 102.1 kg   LMP 01/03/2022 (Approximate)   SpO2 96%   BMI 43.94 kg/m  Physical Exam Vitals and nursing note reviewed.  Constitutional:      General: She is not in acute distress.    Appearance: She is well-developed.  HENT:     Head: Normocephalic and atraumatic.     Nose: Nose normal.     Mouth/Throat:     Mouth: Mucous membranes are moist.  Eyes:     Extraocular Movements: Extraocular movements intact.     Conjunctiva/sclera: Conjunctivae normal.     Pupils: Pupils are equal, round, and reactive to light.  Cardiovascular:     Rate and Rhythm: Normal rate and regular rhythm.     Pulses: Normal pulses.     Heart sounds: Normal heart sounds. No murmur heard. Pulmonary:     Effort: Pulmonary effort is  normal. No respiratory distress.     Breath sounds: Normal breath sounds.  Abdominal:     Palpations: Abdomen is soft.     Tenderness: There is no abdominal tenderness.  Musculoskeletal:        General: No swelling.     Cervical back: Neck supple.  Skin:    General: Skin is warm and dry.     Capillary Refill: Capillary refill takes less than 2 seconds.  Neurological:     Mental Status: She is alert.  Psychiatric:        Mood and Affect: Mood normal.     ED Results / Procedures / Treatments   Labs (all labs ordered are listed, but only abnormal results are displayed) Labs Reviewed  RESP PANEL BY RT-PCR (FLU A&B, COVID) [id number]    EKG None  Radiology DG Chest Portable 1 View  Result Date: 01/18/2022 CLINICAL DATA:  Cough with runny nose, nausea and headache as well as abdominal cramping. Son diagnosed with RSV recently. EXAM: PORTABLE CHEST 1 VIEW COMPARISON:  None Available. FINDINGS: Lungs are hypoinflated and otherwise clear. Cardiomediastinal silhouette is normal. Prominent overlying soft tissues. Bony structures are normal. IMPRESSION: Hypoinflation without acute cardiopulmonary disease.  Electronically Signed   By: Elberta Fortis M.D.   On: 01/18/2022 08:53    Procedures Procedures    Medications Ordered in ED Medications - No data to display  ED Course/ Medical Decision Making/ A&P                           Medical Decision Making Amount and/or Complexity of Data Reviewed Radiology: ordered.   Vallery X Hannahmarie Asberry is here for cough.  Family members positive for RSV.  Her viral swabs negative but she was not tested for RSV and suspect that she has RSV.  Vital signs are overall unremarkable.  Chest x-ray without any evidence of pneumonia per my review and interpretation.  Will give her a dose of Decadron for symptomatic support.  Given reassurance and discharged in ED in good condition.  This chart was dictated using voice recognition software.  Despite best  efforts to proofread,  errors can occur which can change the documentation meaning.         Final Clinical Impression(s) / ED Diagnoses Final diagnoses:  Acute cough    Rx / DC Orders ED Discharge Orders     None         Virgina Norfolk, DO 01/18/22 2440

## 2022-01-18 NOTE — ED Triage Notes (Signed)
C/o cough, headache, nausea, runny nose, abdominal cramping. Son diagnosed with RSV last Monday. Here with other son today.

## 2022-02-24 ENCOUNTER — Emergency Department (HOSPITAL_BASED_OUTPATIENT_CLINIC_OR_DEPARTMENT_OTHER)
Admission: EM | Admit: 2022-02-24 | Discharge: 2022-02-24 | Disposition: A | Discharge status: Home / Self Care | Payer: Medicaid Other | Attending: Emergency Medicine | Admitting: Emergency Medicine

## 2022-02-24 DIAGNOSIS — Z91040 Latex allergy status: Secondary | ICD-10-CM | POA: Diagnosis not present

## 2022-02-24 DIAGNOSIS — R0981 Nasal congestion: Secondary | ICD-10-CM | POA: Diagnosis not present

## 2022-02-24 DIAGNOSIS — R059 Cough, unspecified: Secondary | ICD-10-CM | POA: Diagnosis not present

## 2022-02-24 DIAGNOSIS — Z1152 Encounter for screening for COVID-19: Secondary | ICD-10-CM | POA: Diagnosis not present

## 2022-02-24 DIAGNOSIS — H6692 Otitis media, unspecified, left ear: Secondary | ICD-10-CM

## 2022-02-24 DIAGNOSIS — H5789 Other specified disorders of eye and adnexa: Secondary | ICD-10-CM | POA: Insufficient documentation

## 2022-02-24 DIAGNOSIS — R6889 Other general symptoms and signs: Secondary | ICD-10-CM

## 2022-02-24 DIAGNOSIS — R509 Fever, unspecified: Secondary | ICD-10-CM | POA: Diagnosis present

## 2022-02-24 LAB — RESP PANEL BY RT-PCR (RSV, FLU A&B, COVID)  RVPGX2
Influenza A by PCR: NEGATIVE
Influenza B by PCR: NEGATIVE
Resp Syncytial Virus by PCR: NEGATIVE
SARS Coronavirus 2 by RT PCR: NEGATIVE

## 2022-02-24 MED ORDER — IBUPROFEN 800 MG PO TABS
800.0000 mg | ORAL_TABLET | Freq: Once | ORAL | Status: AC
  Administered 2022-02-24: 800 mg via ORAL
  Filled 2022-02-24: qty 1

## 2022-02-24 MED ORDER — AZITHROMYCIN 250 MG PO TABS: 250.0000 mg | ORAL_TABLET | Freq: Every day | ORAL | 0 refills | Status: DC

## 2022-02-24 NOTE — Discharge Instructions (Addendum)
Take antibiotics as prescribed for 1 week.  Use Tylenol every 4 hours and ibuprofen every 6 hours as needed for pain or fevers.  Stay well-hydrated and follow-up with your doctor if no improvement next week. Follow-up viral testing results on MyChart this afternoon.

## 2022-02-24 NOTE — ED Provider Notes (Signed)
[facility] EMERGENCY DEPARTMENT Provider Note   CSN: [id number] Arrival date & time: 02/24/22  1012     History  Chief Complaint  Patient presents with   Fever    Kristy Nguyen is a 32 y.o. female.  Patient presents with mild eye drainage, cough, congestion, fever fever intermittent and fatigue for the past few days.  Children with similar.  Patient still tolerating oral liquids.  Left ear has been hurting since yesterday.       Home Medications Prior to Admission medications   Medication Sig Start Date End Date Taking? Authorizing Provider  azithromycin (ZITHROMAX) 250 MG tablet Take 1 tablet (250 mg total) by mouth daily. Take first 2 tablets together, then 1 every day until finished. 02/24/22  Yes Elnora Morrison, MD  ibuprofen (ADVIL) 600 MG tablet Take 1 tablet (600 mg total) by mouth every 6 (six) hours as needed. 12/14/21   Tamala Julian, [place], CNM  metroNIDAZOLE (FLAGYL) 500 MG tablet Take 1 tablet (500 mg total) by mouth 2 (two) times daily. 12/19/21   Tamala Julian, [place], CNM      Allergies    Fish allergy, Latex, and Penicillins    Review of Systems   Review of Systems  Constitutional:  Positive for fever. Negative for chills.  HENT:  Positive for congestion.   Eyes:  Negative for visual disturbance.  Respiratory:  Positive for cough. Negative for shortness of breath.   Cardiovascular:  Negative for chest pain.  Gastrointestinal:  Negative for abdominal pain and vomiting.  Genitourinary:  Negative for dysuria and flank pain.  Musculoskeletal:  Negative for back pain, neck pain and neck stiffness.  Skin:  Negative for rash.  Neurological:  Positive for light-headedness. Negative for headaches.    Physical Exam Updated Vital Signs BP (!) 135/94 (BP Location: Right Arm)   Pulse (!) 107   Temp 99.3 F (37.4 C) (Oral)   Resp 20   Ht 5' (1.524 m)   Wt 108.9 kg   LMP 02/02/2022   SpO2 94%   BMI 46.87 kg/m  Physical Exam Vitals and nursing  note reviewed.  Constitutional:      General: She is not in acute distress.    Appearance: She is well-developed.  HENT:     Head: Normocephalic and atraumatic.     Nose: Congestion and rhinorrhea present.     Mouth/Throat:     Mouth: Mucous membranes are moist.  Eyes:     General:        Right eye: No discharge.        Left eye: No discharge.     Conjunctiva/sclera: Conjunctivae normal.     Comments: Mild clear drainage bilateral lower eyelids worse on the left, clinically left otitis media, no otitis externa, no mastoid tenderness  Neck:     Trachea: No tracheal deviation.  Cardiovascular:     Rate and Rhythm: Normal rate and regular rhythm.     Heart sounds: No murmur heard. Pulmonary:     Effort: Pulmonary effort is normal.     Breath sounds: Normal breath sounds.  Abdominal:     General: There is no distension.     Palpations: Abdomen is soft.     Tenderness: There is no abdominal tenderness. There is no guarding.  Musculoskeletal:     Cervical back: Normal range of motion and neck supple. No rigidity.  Skin:    General: Skin is warm.     Capillary Refill: Capillary refill takes less  than 2 seconds.     Findings: No rash.  Neurological:     General: No focal deficit present.     Mental Status: She is alert.     Cranial Nerves: No cranial nerve deficit.  Psychiatric:        Mood and Affect: Mood normal.     ED Results / Procedures / Treatments   Labs (all labs ordered are listed, but only abnormal results are displayed) Labs Reviewed  RESP PANEL BY RT-PCR (RSV, FLU A&B, COVID)  [id number]    EKG None  Radiology No results found.  Procedures Procedures    Medications Ordered in ED Medications  ibuprofen (ADVIL) tablet 800 mg (has no administration in time range)    ED Course/ Medical Decision Making/ A&P                           Medical Decision Making Risk Prescription drug management.   Patient presents with her children with flulike symptoms  and clinical evidence of acute otitis media.  Significant pain and persistent symptoms plan for oral antibiotics and supportive care.  Viral test sent for outpatient follow-up.  Patient comfortable with plan and reasons to return. Ibuprofen given in ER for pain and low-grade fever.        Final Clinical Impression(s) / ED Diagnoses Final diagnoses:  Acute left otitis media  Flu-like symptoms    Rx / DC Orders ED Discharge Orders          Ordered    azithromycin (ZITHROMAX) 250 MG tablet  Daily        02/24/22 1106              Elnora Morrison, MD 02/24/22 1108

## 2022-02-24 NOTE — ED Triage Notes (Signed)
Pt reports fever,cough, congestion yesterday. States that she feels dizzy.

## 2022-02-24 NOTE — ED Triage Notes (Signed)
Mom also has a red left eye with swelling

## 2022-03-21 ENCOUNTER — Telehealth: Payer: Self-pay | Admitting: Family Medicine

## 2022-03-21 DIAGNOSIS — R635 Abnormal weight gain: Secondary | ICD-10-CM

## 2022-03-21 NOTE — Telephone Encounter (Signed)
Patient wants a nurse to call her to discuss the symptoms she's experiencing with her iud and to see if she needs an appointment or not

## 2022-03-22 NOTE — Telephone Encounter (Signed)
Pt had Paraguard IUD placed 11/2021. Pt states she is not having regular periods. RN explained that can be normal with IUD. Pt states she would like IUD removed and would like birth control method that will give her monthly periods like she is used too. She is also requesting to meet with Diabetes Educator. 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 Pt states she is unsure if she still has diabetes or not. Per chart review pt did not have PP appointment and did not complete PP 2hr GTT. Referral placed for Diabetes Educator. Pt informed she would be notified of appointments via [place]. Pt verbalized understanding and denied further questions.

## 2022-04-05 ENCOUNTER — Ambulatory Visit (INDEPENDENT_AMBULATORY_CARE_PROVIDER_SITE_OTHER): Payer: Medicaid Other | Admitting: Registered"

## 2022-04-05 ENCOUNTER — Encounter: Payer: Medicaid Other | Attending: Obstetrics and Gynecology | Admitting: Registered"

## 2022-04-05 ENCOUNTER — Other Ambulatory Visit: Payer: Self-pay

## 2022-04-05 DIAGNOSIS — Z6841 Body Mass Index (BMI) 40.0 and over, adult: Secondary | ICD-10-CM | POA: Insufficient documentation

## 2022-04-05 DIAGNOSIS — Z658 Other specified problems related to psychosocial circumstances: Secondary | ICD-10-CM

## 2022-04-05 NOTE — Progress Notes (Signed)
Medical Nutrition Therapy  Appointment Start time:  [phone]  Appointment End time:  0910  Primary concerns today: Pt has many concerns to day including concern about weight can, increased pain in neck and back, stress eating, liver issues, wanting to avoid developing diabetes.   Referral diagnosis: R63.5 Preferred learning style: no preference indicated Learning readiness: contemplating  NUTRITION ASSESSMENT  Anthropometrics  Wt Readings from Last 3 Encounters:  02/24/22 240 lb (108.9 kg)  01/18/22 225 lb (102.1 kg)  12/14/21 228 lb 3.2 oz (103.5 kg)   Clinical Medical Hx: h/o GDM  Medications: reviewed Labs: reviewed Notable Signs/Symptoms: fatigue, anxiety, weight gain  Lifestyle & Dietary Hx Pt states she has not worked since her baby was born 80 months ago (c-section). Pt reports she was fired from her job which she was employed ~8 yrs. 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 Pt reports  her financial stress is also due to losing her car since she missed a payment. Pt states the father is will not take a paternity test and is not helping her with caring for her baby.  Pt reports her mother lives with her and works at night, and could provide child care during the day if she can find work. Pt states she had a house mate helping with rent but he died in his room about the same time she was about to deliver. Pt did not realize he had died until 3 days later and could smell him. Pt states she has not been able to sleep well at night since then.   Pt states she has been stress eating and having more sweets lately. Pt states she has gained more weight after the delivery. Pt is worries also that she has been told she has an issue with her liver. RD could not find labs or notes to verify what the issue is, but in general gave her tips to help liver health.  Estimated daily fluid intake: 60 oz water, juice Supplements: not assessed Sleep: not  good Stress / self-care: high Current average weekly physical activity: ADL  24-Hr Dietary Recall First Meal: eggs, sausage Snack: none Second Meal: beans, rice, beef Snack: mango Third Meal: beans, rice Snack: none Beverages: milk, water, pineapple juice, watermelon juice  NUTRITION DIAGNOSIS  NB-1.1 Food and nutrition-related knowledge deficit As related to importance to manage stress to help make healthier food choices.  As evidenced by Pt reported stress eating, increased candy intake, increased weight.  NUTRITION INTERVENTION  Nutrition education (E-1) on the following topics:  Stress effect on health Options for resources to help set up a plan to reach goals of being able to financially take care of family Physical activity for mental and physical health Therapist role for acute stressful situation  Handouts Provided Include  Out of the [place] mobile food pantry Feb schedule  Learning Style & Readiness for Change Teaching method utilized: Visual & Auditory  Demonstrated degree of understanding via: Teach Back  Barriers to learning/adherence to lifestyle change: psychosocial stressors  Goals Established by Pt Increase physical activity slowly as you are able Reach out to the various resources provided in AVS Return for more education on healthy eating  MONITORING & EVALUATION Dietary intake, weekly physical activity, and stress management in 1 month.

## 2022-04-05 NOTE — Patient Instructions (Addendum)
Physical activity is very important for both mental and physical health. It can really help reduce stress. Playing soccer, walking, swimming. Try to work in a routine of at least 10 minutes twice a day.  In addition to exercise reduce intake of sweets to help with liver health.   Reach out to find resources to help you be able to take care of yourself and your family.  Food Access Resources:  As a patient of [facility] for [facility] healthcare you are able to access the [employer] located in the Pharmacist[facility] at [facility]. You can go to the front desk and tell them that you are a patient and would like to visit the [employer]. There have limited hours that they are open, but they may be able to give you a grab-n-go bag if they are closed. You could call [phone] if you have questions.  Out of the [employer]: Absolutely[employer]uine.com.br  There is an extensive list of pantries and free meals in [place] and surrounding areas. This list has not been updated in a while.  You may want to call the numbers provided in the guides for current information. https://themiraclesisee.wordpress.com/2015/11/10/the-little-green-book-and-the-little-blue-book-october-2017-editions/   Resource to help you find work or build your business https://guilfordworks.org/ [place]: [place], [place]  [address] Ph. [phone] M, T, TH  8:30 am - 5:00 Wed. 8:30 am - 7:00 pm Fri. 8:30 am - 2:00 pm

## 2022-04-12 NOTE — BH Specialist Note (Unsigned)
Pt did not arrive to video visit and did not answer the phone; Left HIPPA-compliant message to call back Yaremi Stahlman from [facility] at  [phone] (Camry Theiss's office).  ?; left MyChart message for patient.  ? ?

## 2022-04-26 ENCOUNTER — Ambulatory Visit: Payer: Medicaid Other | Admitting: Clinical

## 2022-04-26 DIAGNOSIS — Z91199 Patient's noncompliance with other medical treatment and regimen due to unspecified reason: Secondary | ICD-10-CM

## 2022-04-27 ENCOUNTER — Ambulatory Visit: Payer: Medicaid Other | Admitting: Obstetrics and Gynecology

## 2022-05-03 ENCOUNTER — Other Ambulatory Visit: Payer: Medicaid Other

## 2022-05-12 ENCOUNTER — Emergency Department (HOSPITAL_BASED_OUTPATIENT_CLINIC_OR_DEPARTMENT_OTHER)
Admission: EM | Admit: 2022-05-12 | Discharge: 2022-05-12 | Disposition: A | Discharge status: Home / Self Care | Payer: Medicaid Other | Attending: Emergency Medicine | Admitting: Emergency Medicine

## 2022-05-12 ENCOUNTER — Other Ambulatory Visit: Payer: Self-pay

## 2022-05-12 ENCOUNTER — Other Ambulatory Visit (HOSPITAL_BASED_OUTPATIENT_CLINIC_OR_DEPARTMENT_OTHER): Payer: Self-pay

## 2022-05-12 ENCOUNTER — Encounter (HOSPITAL_BASED_OUTPATIENT_CLINIC_OR_DEPARTMENT_OTHER): Payer: Self-pay

## 2022-05-12 DIAGNOSIS — E119 Type 2 diabetes mellitus without complications: Secondary | ICD-10-CM | POA: Diagnosis not present

## 2022-05-12 DIAGNOSIS — J069 Acute upper respiratory infection, unspecified: Secondary | ICD-10-CM | POA: Insufficient documentation

## 2022-05-12 DIAGNOSIS — Z87891 Personal history of nicotine dependence: Secondary | ICD-10-CM | POA: Diagnosis not present

## 2022-05-12 DIAGNOSIS — Z91040 Latex allergy status: Secondary | ICD-10-CM | POA: Diagnosis not present

## 2022-05-12 DIAGNOSIS — Z20822 Contact with and (suspected) exposure to covid-19: Secondary | ICD-10-CM | POA: Diagnosis not present

## 2022-05-12 DIAGNOSIS — I10 Essential (primary) hypertension: Secondary | ICD-10-CM | POA: Diagnosis not present

## 2022-05-12 DIAGNOSIS — H60502 Unspecified acute noninfective otitis externa, left ear: Secondary | ICD-10-CM | POA: Insufficient documentation

## 2022-05-12 DIAGNOSIS — R059 Cough, unspecified: Secondary | ICD-10-CM | POA: Diagnosis present

## 2022-05-12 LAB — SARS CORONAVIRUS 2 BY RT PCR: SARS Coronavirus 2 by RT PCR: NEGATIVE

## 2022-05-12 LAB — GROUP A STREP BY PCR: Group A Strep by PCR: NOT DETECTED

## 2022-05-12 MED ORDER — TRIAMCINOLONE ACETONIDE 55 MCG/ACT NA AERO
2.0000 | INHALATION_SPRAY | Freq: Every day | NASAL | 12 refills | Status: AC
  Filled 2022-05-12: qty 16.9, 30d supply, fill #0
  Filled 2022-07-05: qty 16.9, 30d supply, fill #1

## 2022-05-12 MED ORDER — CIPROFLOXACIN-DEXAMETHASONE 0.3-0.1 % OT SUSP
4.0000 [drp] | Freq: Two times a day (BID) | OTIC | 0 refills | Status: AC
  Filled 2022-05-12: qty 7.5, 7d supply, fill #0

## 2022-05-12 MED ORDER — CETIRIZINE-PSEUDOEPHEDRINE ER 5-120 MG PO TB12
1.0000 | ORAL_TABLET | Freq: Every day | ORAL | 0 refills | Status: AC
  Filled 2022-05-12: qty 24, 24d supply, fill #0

## 2022-05-12 MED ORDER — CEFDINIR 300 MG PO CAPS
300.0000 mg | ORAL_CAPSULE | Freq: Two times a day (BID) | ORAL | 0 refills | Status: DC
  Filled 2022-05-12: qty 14, 7d supply, fill #0

## 2022-05-12 NOTE — ED Triage Notes (Addendum)
Pt reports intermittent t bilateral ear pain/pressure  Throat pain. Pain around neck area. X 4 days. Endorses nasal congestion and fever. Son recently sick with virus. Reports itching in hands x one week.

## 2022-05-12 NOTE — ED Provider Notes (Signed)
[facility]ERGENCY [facility]MENT [facility] Provider Note   CSN: [id number] Arrival date & time: 05/12/22  1154     History  Chief Complaint  Patient presents with   Otalgia   Sore Throat   Pruritis    Gifford Shave Kristy Nguyen is a 32 y.o. female.   Otalgia Sore Throat   32 year old female presents emergency department with complaints of cough, sore throat, nasal congestion, bilateral ear pressure.  Patient states that symptoms been present for the past 2 days.  Notes son at home sick with "viral upper respiratory illness."  Has been trying at home NyQuil which has helped some.  Denies fever, chills, night sweats, chest pain, shortness of breath, abdominal pain, nausea, vomiting, urinary symptoms, change in bowel habits.  States she has been able to eat and drink but with pain.  Denies any difficulty breathing.  Past medical history significant for diabetes mellitus, seasonal allergies, obesity  Home Medications Prior to Admission medications   Medication Sig Start Date End Date Taking? Authorizing Provider  cefdinir (OMNICEF) 300 MG capsule Take 1 capsule (300 mg total) by mouth 2 (two) times daily. 05/12/22  Yes Dion Saucier A, PA  cetirizine-pseudoephedrine (ZYRTEC-D) 5-120 MG tablet Take 1 tablet by mouth daily. 05/12/22  Yes Dion Saucier A, PA  ciprofloxacin-dexamethasone (CIPRODEX) OTIC suspension Place 4 drops into the left ear 2 (two) times daily for 7 days. 05/12/22 05/19/22 Yes Dion Saucier A, PA  triamcinolone (NASACORT) 55 MCG/ACT AERO nasal inhaler Place 2 sprays into the nose daily. 05/12/22  Yes Dion Saucier A, PA  azithromycin (ZITHROMAX) 250 MG tablet Take 1 tablet (250 mg total) by mouth daily. Take first 2 tablets together, then 1 every day until finished. 02/24/22   Elnora Morrison, MD  ibuprofen (ADVIL) 600 MG tablet Take 1 tablet (600 mg total) by mouth every 6 (six) hours as needed. 12/14/21   Tamala Julian, [place], CNM  metroNIDAZOLE (FLAGYL) 500  MG tablet Take 1 tablet (500 mg total) by mouth 2 (two) times daily. 12/19/21   Tamala Julian, [place], CNM      Allergies    Fish allergy, Latex, and Penicillins    Review of Systems   Review of Systems  HENT:  Positive for ear pain.     Physical Exam Updated Vital Signs BP (!) 146/101 (BP Location: Right Arm)   Pulse 91   Temp 97.9 F (36.6 C) (Oral)   Resp 18   Ht (!) 5" (0.127 m)   Wt 104.3 kg   LMP  (LMP Unknown) Comment: IUD/ no period in 2 months  SpO2 95%   BMI 6468.31 kg/m  Physical Exam Vitals and nursing note reviewed.  Constitutional:      General: She is not in acute distress.    Appearance: She is well-developed.  HENT:     Head: Normocephalic and atraumatic.     Right Ear: Tympanic membrane and ear canal normal.     Ears:     Comments: Patient with erythematous external auditory canal on the left side with appreciable drainage.  Not observable left TM.  Area exquisitely tenderness to palpation.  External ear without erythema, palpable induration/fluctuance.    Nose: Congestion and rhinorrhea present.     Mouth/Throat:     Comments: Patient with mild to moderate posterior pharyngeal erythema.  Uvula midline and rises symmetric with phonation.  Tonsils 23+ bilaterally with no obvious exudate.  No sublingual or submandibular swelling appreciated.  Patient tolerating oral secretions without difficulty.  Eyes:     Conjunctiva/sclera: Conjunctivae normal.     Pupils: Pupils are equal, round, and reactive to light.  Cardiovascular:     Rate and Rhythm: Normal rate and regular rhythm.     Heart sounds: No murmur heard. Pulmonary:     Effort: Pulmonary effort is normal. No respiratory distress.     Breath sounds: Normal breath sounds. No stridor. No wheezing, rhonchi or rales.  Abdominal:     Palpations: Abdomen is soft.     Tenderness: There is no abdominal tenderness.  Musculoskeletal:        General: No swelling.     Cervical back: Neck supple.  Skin:    General:  Skin is warm and dry.     Capillary Refill: Capillary refill takes less than 2 seconds.  Neurological:     Mental Status: She is alert.  Psychiatric:        Mood and Affect: Mood normal.     ED Results / Procedures / Treatments   Labs (all labs ordered are listed, but only abnormal results are displayed) Labs Reviewed  SARS CORONAVIRUS 2 BY RT PCR  GROUP A STREP BY PCR    EKG None  Radiology No results found.  Procedures Procedures    Medications Ordered in ED Medications - No data to display  ED Course/ Medical Decision Making/ A&P                             Medical Decision Making Risk OTC drugs. Prescription drug management.   This patient presents to the ED for concern of influenza-like illness, this involves an extensive number of treatment options, and is a complaint that carries with it a high risk of complications and morbidity.  The differential diagnosis includes influenza, COVID, RSV, viral URI, otitis media, seasonal allergies, strep pharyngitis, Ludwig angina, necrotizing ulcerative gingivitis, Lemierre's disease   Co morbidities that complicate the patient evaluation  See HPI   Additional history obtained:  Additional history obtained from EMR External records from outside source obtained and reviewed including hospital records   Lab Tests:  I Ordered, and personally interpreted labs.  The pertinent results include: COVID-negative.  Group A strep negative.   Imaging Studies ordered:  N/a   Cardiac Monitoring: / EKG:  The patient was maintained on a cardiac monitor.  I personally viewed and interpreted the cardiac monitored which showed an underlying rhythm of: Sinus rhythm   Consultations Obtained:  N/a   Problem List / ED Course / Critical interventions / Medication management  Viral URI Reevaluation of the patient showed that the patient stayed the same I have reviewed the patients home medicines and have made adjustments  as needed   Social Determinants of Health:  Former cigarette use.  Denies illicit drug use.   Test / Admission - Considered:  Viral URI/otitis externa Vitals signs significant for hypertension with blood pressure 146/101. Otherwise within normal range and stable throughout visit. Laboratory studies significant for: See above 32 year old female presents emergency department with multiple complaints.  Symptoms most consistent with viral URI.  Patient overall well-appearing, afebrile in no acute distress.  Patient with left-sided otitis externa with normal observable left TM.  will send in topical antibiotic drops for left-sided otitis externa.  Recommend symptomatic therapy at home with antihistamine in the form of Zyrtec/Claritin/Allegra, nasal steroid spray, decongestion, Tylenol/Motrin as needed for pain/fever.  Recommend follow-up with primary care for reassessment of symptoms.  Treatment plan discussed at length with patient and she acknowledged understanding was agreeable to said plan. Worrisome signs and symptoms were discussed with the patient, and the patient acknowledged understanding to return to the ED if noticed. Patient was stable upon discharge.          Final Clinical Impression(s) / ED Diagnoses Final diagnoses:  Viral URI with cough  Acute otitis externa of left ear, unspecified type    Rx / DC Orders ED Discharge Orders          Ordered    cefdinir (OMNICEF) 300 MG capsule  2 times daily        05/12/22 1318    cetirizine-pseudoephedrine (ZYRTEC-D) 5-120 MG tablet  Daily        05/12/22 1318    ciprofloxacin-dexamethasone (CIPRODEX) OTIC suspension  2 times daily        05/12/22 1318    triamcinolone (NASACORT) 55 MCG/ACT AERO nasal inhaler  Daily        05/12/22 1319              Wilnette Kales, [place] 05/12/22 1403    Fredia Sorrow, MD 05/13/22 [phone]

## 2022-05-12 NOTE — ED Notes (Signed)
Discharge instructions reviewed with patient. Patient verbalizes understanding, no further questions at this time. Medications/prescriptions and follow up information provided. No acute distress noted at time of departure.  

## 2022-05-12 NOTE — Discharge Instructions (Addendum)
With your workup today was overall reassuring.  You tested negative for COVID and strep throat.  Your left ear did look infected so we will begin antibiotic drops as well as pills to take to treat infection.  Recommend additional treatment at home with oral allergy medicine in the form of Zyrtec/Allegra/Claritin, nasal steroid spray in the form of Nasacort/Flonase, Tylenol/Motrin as needed for pain/fever.  Recommend follow-up with primary care for reassessment of your symptoms.  Please do not hesitate to return to emergency department for worrisome signs and symptoms we discussed become apparent.

## 2022-07-05 ENCOUNTER — Other Ambulatory Visit (HOSPITAL_BASED_OUTPATIENT_CLINIC_OR_DEPARTMENT_OTHER): Payer: Self-pay

## 2022-07-05 ENCOUNTER — Other Ambulatory Visit: Payer: Self-pay | Admitting: Advanced Practice Midwife

## 2022-07-05 DIAGNOSIS — B9689 Other specified bacterial agents as the cause of diseases classified elsewhere: Secondary | ICD-10-CM

## 2022-07-13 ENCOUNTER — Other Ambulatory Visit (HOSPITAL_BASED_OUTPATIENT_CLINIC_OR_DEPARTMENT_OTHER): Payer: Self-pay

## 2022-07-27 ENCOUNTER — Other Ambulatory Visit (HOSPITAL_BASED_OUTPATIENT_CLINIC_OR_DEPARTMENT_OTHER): Payer: Self-pay

## 2022-07-29 ENCOUNTER — Encounter (HOSPITAL_BASED_OUTPATIENT_CLINIC_OR_DEPARTMENT_OTHER): Payer: Self-pay | Admitting: Emergency Medicine

## 2022-07-29 ENCOUNTER — Other Ambulatory Visit: Payer: Self-pay

## 2022-07-29 ENCOUNTER — Emergency Department (HOSPITAL_BASED_OUTPATIENT_CLINIC_OR_DEPARTMENT_OTHER)
Admission: EM | Admit: 2022-07-29 | Discharge: 2022-07-29 | Disposition: A | Discharge status: Home / Self Care | Payer: Medicaid Other | Attending: Emergency Medicine | Admitting: Emergency Medicine

## 2022-07-29 DIAGNOSIS — N760 Acute vaginitis: Secondary | ICD-10-CM | POA: Diagnosis not present

## 2022-07-29 DIAGNOSIS — Z91040 Latex allergy status: Secondary | ICD-10-CM | POA: Insufficient documentation

## 2022-07-29 DIAGNOSIS — R102 Pelvic and perineal pain: Secondary | ICD-10-CM | POA: Diagnosis present

## 2022-07-29 DIAGNOSIS — B9689 Other specified bacterial agents as the cause of diseases classified elsewhere: Secondary | ICD-10-CM

## 2022-07-29 LAB — URINALYSIS, MICROSCOPIC (REFLEX)

## 2022-07-29 LAB — URINALYSIS, ROUTINE W REFLEX MICROSCOPIC
Bilirubin Urine: NEGATIVE
Glucose, UA: NEGATIVE mg/dL
Ketones, ur: NEGATIVE mg/dL
Leukocytes,Ua: NEGATIVE
Nitrite: NEGATIVE
Protein, ur: >=300 mg/dL — AB
Specific Gravity, Urine: >=1.03 (ref 1.005–1.030)
pH: 6 (ref 5.0–8.0)

## 2022-07-29 LAB — WET PREP, GENITAL
Sperm: NONE SEEN
Trich, Wet Prep: NONE SEEN
WBC, Wet Prep HPF POC: <10 (ref <10)
Yeast Wet Prep HPF POC: NONE SEEN

## 2022-07-29 LAB — PREGNANCY, URINE: Preg Test, Ur: NEGATIVE

## 2022-07-29 MED ORDER — MORPHINE SULFATE (PF) 4 MG/ML IV SOLN: 4.0000 mg | Freq: Once | INTRAVENOUS | Status: DC

## 2022-07-29 MED ORDER — ACETAMINOPHEN 500 MG PO TABS
1000.0000 mg | ORAL_TABLET | Freq: Once | ORAL | Status: AC
  Administered 2022-07-29: 1000 mg via ORAL
  Filled 2022-07-29: qty 2

## 2022-07-29 MED ORDER — METRONIDAZOLE 500 MG PO TABS: 500.0000 mg | ORAL_TABLET | Freq: Two times a day (BID) | ORAL | 0 refills | Status: AC

## 2022-07-29 MED ORDER — METRONIDAZOLE 500 MG PO TABS
500.0000 mg | ORAL_TABLET | Freq: Once | ORAL | Status: AC
  Administered 2022-07-29: 500 mg via ORAL
  Filled 2022-07-29: qty 1

## 2022-07-29 NOTE — ED Triage Notes (Signed)
Pt reports abdominal cramping and burning with urination x 5 days.

## 2022-07-29 NOTE — ED Notes (Signed)

## 2022-07-29 NOTE — Discharge Instructions (Signed)
Please follow-up with the [facility] clinic of [facility] for you regarding recent symptoms and ER visit.  Please pick up the antibiotics I prescribed for you and do not drink alcohol as this will cause her stomach to be very upset.  As we discussed we decided to forego the blood labs today which included CBC, CMP, lipase at your request.  You may have these labs drawn by her primary care provider or the women's health clinic.  If symptoms change or worsen please return to ER.  Please abstain from intercourse until you complete the antibiotics.

## 2022-07-29 NOTE — ED Provider Notes (Signed)
[facility] Provider Note   CSN: [id number] Arrival date & time: 07/29/22  1727     History  Chief Complaint  Patient presents with   Abdominal Pain    Kristy Nguyen is a 32 y.o. female history of gestational diabetes presented for dysuria and pelvic pain.  Patient states last 5 days she has been having burning when she pees but denies any hematuria.  Patient notes that she has a new boyfriend does not use condoms and is concerned for STDs and wants to be tested for gonorrhea chlamydia, BV, trichomonas, yeast infection, HIV, syphilis.  Patient denies any vaginal discharge or abdominal pain.  Triage notes that patient endorsed abdominal pain however when I spoke to the patient patient stated she was having pelvic pain with dysuria and not abdominal pain  Home Medications Prior to Admission medications   Medication Sig Start Date End Date Taking? Authorizing Provider  metroNIDAZOLE (FLAGYL) 500 MG tablet Take 1 tablet (500 mg total) by mouth 2 (two) times daily. 07/29/22  Yes Adriyanna Christians, Beverly Gust, PA-C  azithromycin (ZITHROMAX) 250 MG tablet Take 1 tablet (250 mg total) by mouth daily. Take first 2 tablets together, then 1 every day until finished. 02/24/22   Blane Ohara, MD  cefdinir (OMNICEF) 300 MG capsule Take 1 capsule (300 mg total) by mouth 2 (two) times daily. 05/12/22   Peter Garter, PA  cetirizine-pseudoephedrine (ZYRTEC-D) 5-120 MG tablet Take 1 tablet by mouth daily. 05/12/22   Peter Garter, PA  ibuprofen (ADVIL) 600 MG tablet Take 1 tablet (600 mg total) by mouth every 6 (six) hours as needed. 12/14/21   Katrinka Blazing, [place], CNM  triamcinolone (NASACORT) 55 MCG/ACT AERO nasal inhaler Place 2 sprays into the nose daily. 05/12/22   Peter Garter, PA      Allergies    Fish allergy, Latex, and Penicillins    Review of Systems   Review of Systems  Gastrointestinal:  Positive for abdominal pain.    Physical  Exam Updated Vital Signs BP 123/73   Pulse (!) 102   Temp 98.3 F (36.8 C) (Oral)   Resp 18   SpO2 98%  Physical Exam Exam conducted with a chaperone present.  Constitutional:      General: She is not in acute distress. Abdominal:     Palpations: Abdomen is soft. There is no mass.     Tenderness: There is no abdominal tenderness. There is no guarding or rebound.  Genitourinary:    Exam position: Lithotomy position.     Labia:        Right: No rash, tenderness, lesion or injury.        Left: No rash, tenderness, lesion or injury.      Vagina: Normal.     Cervix: Normal.     Uterus: Normal.      Adnexa: Right adnexa normal and left adnexa normal.     Comments: Chaperone: Kristy Nguyen, Tech No abnormalities noted on exam Neurological:     Mental Status: She is alert.     ED Results / Procedures / Treatments   Labs (all labs ordered are listed, but only abnormal results are displayed) Labs Reviewed  WET PREP, GENITAL - Abnormal; Notable for the following components:      Result Value   Clue Cells Wet Prep HPF POC PRESENT (*)    All other components within normal limits  URINALYSIS, ROUTINE W REFLEX MICROSCOPIC - Abnormal; Notable for the  following components:   Hgb urine dipstick SMALL (*)    Protein, ur >=300 (*)    All other components within normal limits  URINALYSIS, MICROSCOPIC (REFLEX) - Abnormal; Notable for the following components:   Bacteria, UA FEW (*)    All other components within normal limits  PREGNANCY, URINE  RPR  HIV ANTIBODY (ROUTINE TESTING W REFLEX)  GC/CHLAMYDIA PROBE AMP ([place]) NOT AT [facility]    EKG None  Radiology No results found.  Procedures Procedures    Medications Ordered in ED Medications  acetaminophen (TYLENOL) tablet 1,000 mg (1,000 mg Oral Given 07/29/22 2021)  metroNIDAZOLE (FLAGYL) tablet 500 mg (500 mg Oral Given 07/29/22 2050)    ED Course/ Medical Decision Making/ A&P                             Medical Decision  Making Amount and/or Complexity of Data Reviewed Labs: ordered.   Kristy Nguyen 32 y.o. presented today for pelvic pain and dysuria. Working DDx that I considered at this time includes, but not limited to, UTI, gonorrhea chlamydia, trichomonas, BV, vaginal candidiasis, syphilis, HIV.  R/o DDx: Cannot be determined at this time  Review of prior external notes: 05/12/2022 ED  Unique Tests and My Interpretation:  UA: Unremarkable Urine Pregnancy: Negative Wet mount: Clue cells present  Discussion with Independent Historian: None  Discussion of Management of Tests: None  Risk: Medium: prescription drug management  Risk Stratification Score: None  Plan: Patient presented for pelvic pain and dysuria.  On exam patient was in no acute distress and stable vitals.  With chaperone in the room a GU exam was conducted that did not show any abnormalities.  Patient had nontender abdomen palpation.  Initially patient said she was only having pelvic pain and dysuria and only wanted to be tested for STDs however before we started the GU exam patient states she was having abdominal cramping and so abdominal labs were also ordered along with Tylenol.  Patient states her last period was a month ago and the symptoms are similar to when she is on her period.  I suspect patient may be beginning her period however labs will be drawn.  Patient stable this time.  While waiting for abdominal pain labs to be drawn patient stated that she felt this was overkill and just wanted to be treated for her BV as she had a clue cells present.  I spoke to the patient about the risk and benefit of not doing the abdominal pain labs and patient verbalized understanding of the risks and accepting of the risks including missing a diagnosis, worsening of symptoms, possible missed diagnosis, etc.  Patient states she can have these labs drawn by primary care provider or a GYN.  Patient was given 1 dose of Flagyl before  discharge and given a prescription.  I spoke to the patient about not drinking alcohol as this will upset her stomach.  Spoke to the patient about not having sex until she finishes her Flagyl.  I spoke to the patient about following up the MyChart app in regards to the rest of her labs.  Patient was given return precautions. Patient stable for discharge at this time.  Patient verbalized understanding of plan.         Final Clinical Impression(s) / ED Diagnoses Final diagnoses:  Bacterial vaginosis    Rx / DC Orders ED Discharge Orders  Ordered    metroNIDAZOLE (FLAGYL) 500 MG tablet  2 times daily        07/29/22 2046              Remi Deter 07/29/22 2109    Virgina Norfolk, DO 07/29/22 2311

## 2022-07-30 LAB — RPR: RPR Ser Ql: NONREACTIVE

## 2022-07-30 LAB — HIV ANTIBODY (ROUTINE TESTING W REFLEX): HIV Screen 4th Generation wRfx: NONREACTIVE

## 2022-08-01 LAB — GC/CHLAMYDIA PROBE AMP (~~LOC~~) NOT AT ARMC
Chlamydia: NEGATIVE
Comment: NEGATIVE
Comment: NORMAL
Neisseria Gonorrhea: NEGATIVE

## 2022-10-07 ENCOUNTER — Emergency Department (HOSPITAL_BASED_OUTPATIENT_CLINIC_OR_DEPARTMENT_OTHER)
Admission: EM | Admit: 2022-10-07 | Discharge: 2022-10-07 | Disposition: A | Discharge status: Home / Self Care | Payer: Medicaid Other | Source: Home / Self Care | Attending: Emergency Medicine | Admitting: Emergency Medicine

## 2022-10-07 ENCOUNTER — Other Ambulatory Visit: Payer: Self-pay

## 2022-10-07 DIAGNOSIS — R1030 Lower abdominal pain, unspecified: Secondary | ICD-10-CM | POA: Diagnosis present

## 2022-10-07 DIAGNOSIS — N3090 Cystitis, unspecified without hematuria: Secondary | ICD-10-CM | POA: Diagnosis not present

## 2022-10-07 DIAGNOSIS — Z91040 Latex allergy status: Secondary | ICD-10-CM | POA: Insufficient documentation

## 2022-10-07 LAB — COMPREHENSIVE METABOLIC PANEL
ALT: 65 U/L — ABNORMAL HIGH (ref 0–44)
AST: 53 U/L — ABNORMAL HIGH (ref 15–41)
Albumin: 3.9 g/dL (ref 3.5–5.0)
Alkaline Phosphatase: 157 U/L — ABNORMAL HIGH (ref 38–126)
Anion gap: 10 (ref 5–15)
BUN: 19 mg/dL (ref 6–20)
CO2: 23 mmol/L (ref 22–32)
Calcium: 9.3 mg/dL (ref 8.9–10.3)
Chloride: 101 mmol/L (ref 98–111)
Creatinine, Ser: 0.68 mg/dL (ref 0.44–1.00)
GFR, Estimated: >60 mL/min (ref >60)
Glucose, Bld: 238 mg/dL — ABNORMAL HIGH (ref 70–99)
Potassium: 3.9 mmol/L (ref 3.5–5.1)
Sodium: 134 mmol/L — ABNORMAL LOW (ref 135–145)
Total Bilirubin: 0.4 mg/dL (ref 0.3–1.2)
Total Protein: 8.1 g/dL (ref 6.5–8.1)

## 2022-10-07 LAB — URINALYSIS, W/ REFLEX TO CULTURE (INFECTION SUSPECTED)
Bilirubin Urine: NEGATIVE
Glucose, UA: >=500 mg/dL — AB
Ketones, ur: NEGATIVE mg/dL
Leukocytes,Ua: NEGATIVE
Nitrite: POSITIVE — AB
Protein, ur: >=300 mg/dL — AB
Specific Gravity, Urine: >=1.03 (ref 1.005–1.030)
pH: 6 (ref 5.0–8.0)

## 2022-10-07 LAB — CBC WITH DIFFERENTIAL/PLATELET
Abs Immature Granulocytes: 0.06 10*3/uL (ref 0.00–0.07)
Basophils Absolute: 0.1 10*3/uL (ref 0.0–0.1)
Basophils Relative: 1 %
Eosinophils Absolute: 0.2 10*3/uL (ref 0.0–0.5)
Eosinophils Relative: 2 %
HCT: 43.3 % (ref 36.0–46.0)
Hemoglobin: 13.7 g/dL (ref 12.0–15.0)
Immature Granulocytes: 1 %
Lymphocytes Relative: 29 %
Lymphs Abs: 3 10*3/uL (ref 0.7–4.0)
MCH: 28.6 pg (ref 26.0–34.0)
MCHC: 31.6 g/dL (ref 30.0–36.0)
MCV: 90.4 fL (ref 80.0–100.0)
Monocytes Absolute: 0.6 10*3/uL (ref 0.1–1.0)
Monocytes Relative: 5 %
Neutro Abs: 6.7 10*3/uL (ref 1.7–7.7)
Neutrophils Relative %: 62 %
Platelets: 314 10*3/uL (ref 150–400)
RBC: 4.79 MIL/uL (ref 3.87–5.11)
RDW: 12.3 % (ref 11.5–15.5)
WBC: 10.6 10*3/uL — ABNORMAL HIGH (ref 4.0–10.5)
nRBC: 0 % (ref 0.0–0.2)

## 2022-10-07 LAB — PREGNANCY, URINE: Preg Test, Ur: NEGATIVE

## 2022-10-07 MED ORDER — CEPHALEXIN 250 MG PO CAPS
500.0000 mg | ORAL_CAPSULE | Freq: Once | ORAL | Status: AC
  Administered 2022-10-07: 500 mg via ORAL
  Filled 2022-10-07: qty 2

## 2022-10-07 MED ORDER — CEPHALEXIN 500 MG PO CAPS: 500.0000 mg | ORAL_CAPSULE | Freq: Two times a day (BID) | ORAL | 0 refills | Status: AC

## 2022-10-07 NOTE — ED Triage Notes (Signed)
Pt POV c/o fatigue, nausea, headache x 2-3 months. Reports cramping pain in abdomen today.   Denies nausea at this time.

## 2022-10-07 NOTE — Discharge Instructions (Addendum)
As discussed, you have a UTI which is most likely causing your abdominal pain. I have sent a prescription of Keflex to your pharmacy. Take this 2x a day for the next 7 days. Make sure to complete entirety of prescription to ensure resolution of infection.  I have sent a referral to primary care provider.  Their office will give you a call next couple days to schedule an appointment for you.  They will be able to manage your blood pressure long-term.   Get help right away if: You have very bad pain in your back or lower belly. You faint.

## 2022-10-07 NOTE — ED Provider Notes (Signed)
EMERGENCY DEPARTMENT AT [facility] [place] Provider Note   CSN: [id number] Arrival date & time: 10/07/22  2003     History  Chief Complaint  Patient presents with  . Fatigue    Kristy Nguyen is a 32 y.o. female with a history of obesity who presents to the ED today for multiple concerns.  Patient reports intermittent suprapubic pain and dysuria for the past 2 months as well as feeling more fatigued throughout the day. Additionally, patient reports 1 episode of palpitations that occurred about a month ago and lasted about 20 seconds. She never experienced that sensation before that or since it occurred. Denies fever, nausea, vomiting, or diarrhea. No other concerns or complaints at this time.    Home Medications Prior to Admission medications   Medication Sig Start Date End Date Taking? Authorizing Provider  cephALEXin (KEFLEX) 500 MG capsule Take 1 capsule (500 mg total) by mouth 2 (two) times daily for 7 days. 10/07/22 10/14/22 Yes Maxwell Marion, PA-C  cetirizine-pseudoephedrine (ZYRTEC-D) 5-120 MG tablet Take 1 tablet by mouth daily. 05/12/22   Peter Garter, PA  ibuprofen (ADVIL) 600 MG tablet Take 1 tablet (600 mg total) by mouth every 6 (six) hours as needed. 12/14/21   Katrinka Blazing, [place], CNM  metroNIDAZOLE (FLAGYL) 500 MG tablet Take 1 tablet (500 mg total) by mouth 2 (two) times daily. 07/29/22   Netta Corrigan, PA-C  triamcinolone (NASACORT) 55 MCG/ACT AERO nasal inhaler Place 2 sprays into the nose daily. 05/12/22   Peter Garter, PA      Allergies    Fish allergy, Latex, and Penicillins    Review of Systems   Review of Systems  Constitutional:  Positive for fatigue.  Gastrointestinal:  Positive for abdominal pain.  All other systems reviewed and are negative.   Physical Exam Updated Vital Signs BP (!) 148/94   Pulse 78   Temp 98 F (36.7 C) (Oral)   Resp 15   Ht 5\' 1"  (1.549 m)   Wt 111.1 kg   LMP 08/22/2022 (Approximate)   SpO2 98%    BMI 46.29 kg/m  Physical Exam Vitals and nursing note reviewed.  Constitutional:      Appearance: Normal appearance.  HENT:     Head: Normocephalic and atraumatic.     Mouth/Throat:     Mouth: Mucous membranes are moist.  Eyes:     Conjunctiva/sclera: Conjunctivae normal.     Pupils: Pupils are equal, round, and reactive to light.  Cardiovascular:     Rate and Rhythm: Normal rate and regular rhythm.     Pulses: Normal pulses.     Heart sounds: Normal heart sounds.  Pulmonary:     Effort: Pulmonary effort is normal.     Breath sounds: Normal breath sounds.  Abdominal:     Palpations: Abdomen is soft.     Tenderness: There is abdominal tenderness.     Comments: Suprapubic tenderness  Skin:    General: Skin is warm and dry.     Findings: No rash.  Neurological:     General: No focal deficit present.     Mental Status: She is alert.     Sensory: No sensory deficit.     Motor: No weakness.  Psychiatric:        Mood and Affect: Mood normal.        Behavior: Behavior normal.     ED Results / Procedures / Treatments   Labs (all labs ordered are listed,  but only abnormal results are displayed) Labs Reviewed  COMPREHENSIVE METABOLIC PANEL - Abnormal; Notable for the following components:      Result Value   Sodium 134 (*)    Glucose, Bld 238 (*)    AST 53 (*)    ALT 65 (*)    Alkaline Phosphatase 157 (*)    All other components within normal limits  CBC WITH DIFFERENTIAL/PLATELET - Abnormal; Notable for the following components:   WBC 10.6 (*)    All other components within normal limits  URINALYSIS, W/ REFLEX TO CULTURE (INFECTION SUSPECTED) - Abnormal; Notable for the following components:   Glucose, UA >=500 (*)    Hgb urine dipstick TRACE (*)    Protein, ur >=300 (*)    Nitrite POSITIVE (*)    Bacteria, UA MANY (*)    All other components within normal limits  PREGNANCY, URINE    EKG None  Radiology No results found.  Procedures Procedures: not  indicated.   Medications Ordered in ED Medications  cephALEXin (KEFLEX) capsule 500 mg (500 mg Oral Given 10/07/22 2211)    ED Course/ Medical Decision Making/ A&P Clinical Course as of 10/07/22 2227  Fri Oct 07, 2022  2223 Consulted on care plan per PA and agree with plan of care. [CC]    Clinical Course User Index [CC] Glyn Ade, MD                                 Medical Decision Making Amount and/or Complexity of Data Reviewed Labs: ordered.   This patient presents to the ED for concern of fatigue and abdominal pain, this involves an extensive number of treatment options, and is a complaint that carries with it a high risk of complications and morbidity.   Differential diagnosis includes: cardiac arrhythmia, hypoglycemia, hyperglycemia, DKA, electrolyte abnormality, dehydration, anemia, UTI, etc.   Comorbidities  See HPI above   Additional History  Additional history obtained from patient's previous records.   Cardiac Monitoring / EKG  The patient was maintained on a cardiac monitor.  I personally viewed and interpreted the cardiac monitored which showed: sinus rhythm with a heart rate of 93 bpm.   Lab Tests  I ordered and personally interpreted labs.  The pertinent results include:   CMP shows glucose of 238 otherwise within normal limits CBC is within normal limits no signs of acute infection or anemia UA shows many bacteria and nitrites Pregnancy test is negative   Problem List / ED Course / Critical Interventions / Medication Management  Fatigue and abdominal pain I ordered medications including: Keflex for UTI treatment I have reviewed the patients home medicines and have made adjustments as needed   Social Determinants of Health  Access to healthcare   Test / Admission - Considered  Discussed results with patient and family at bedside. Prescription for Keflex sent to the pharmacy. Referral for primary care sent the patient can  establish care. Return precautions provided.        Final Clinical Impression(s) / ED Diagnoses Final diagnoses:  Cystitis    Rx / DC Orders ED Discharge Orders          Ordered    Ambulatory referral to [facility]        10/07/22 2159    cephALEXin (KEFLEX) 500 MG capsule  2 times daily        10/07/22 2201  Maxwell Marion, PA-C 10/07/22 2227    Glyn Ade, MD 10/10/22 [phone]

## 2023-01-29 IMAGING — CT CT ABD-PELV W/ CM
2 of 4 series · 16 of 46 positions shown, 18 images · IV contrast (APPLIED)
Comparison: None.

CLINICAL DATA: RLQ abdominal pain

EXAM:
CT ABDOMEN AND PELVIS WITH CONTRAST
TECHNIQUE: Multidetector CT imaging of the abdomen and pelvis was performed
using the standard protocol following bolus administration of
intravenous contrast.
CONTRAST:  100mL OMNIPAQUE IOHEXOL 300 MG/ML  SOLN

[Series 3: abdomen 5.0 · axial · 0.84mm/px · z∈[-465,-55]mm · 13 of 94 slices shown, 15 images]
[im 6/94  soft-tissue]
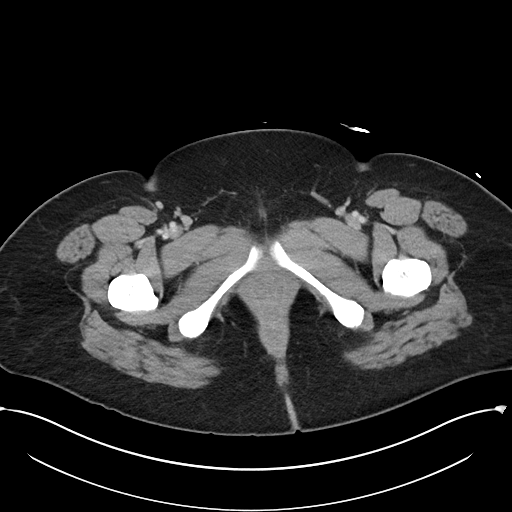
[im 6/94  bone]
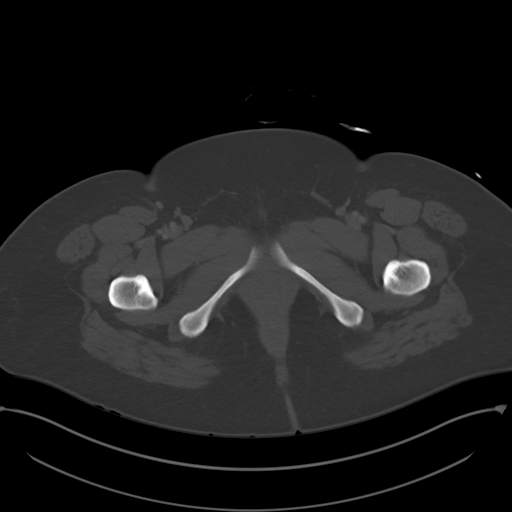
[im 12/94  soft-tissue]
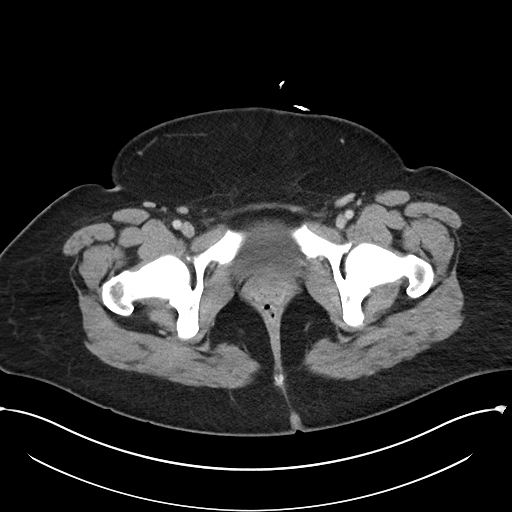
[im 18/94  soft-tissue]
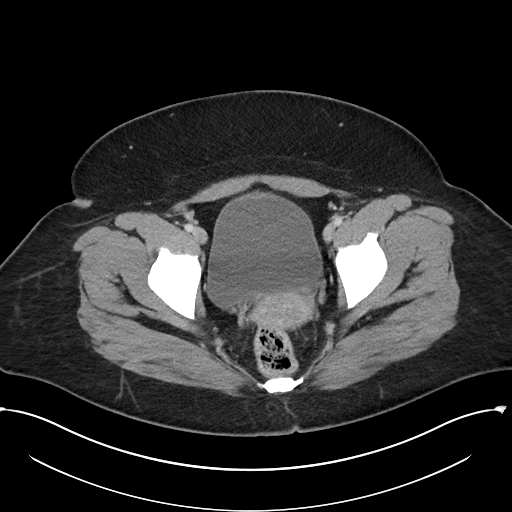
[im 30/94  soft-tissue]
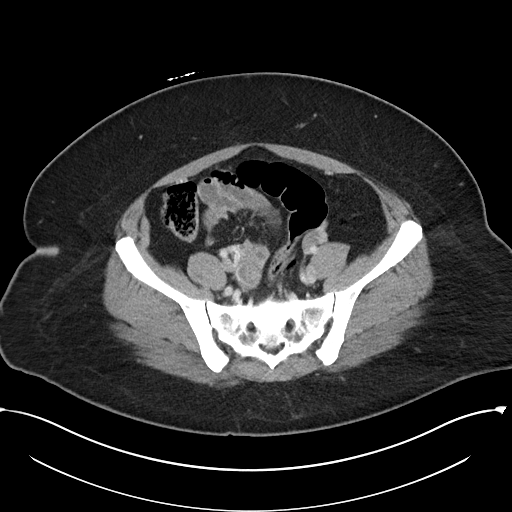
[im 35/94  soft-tissue]
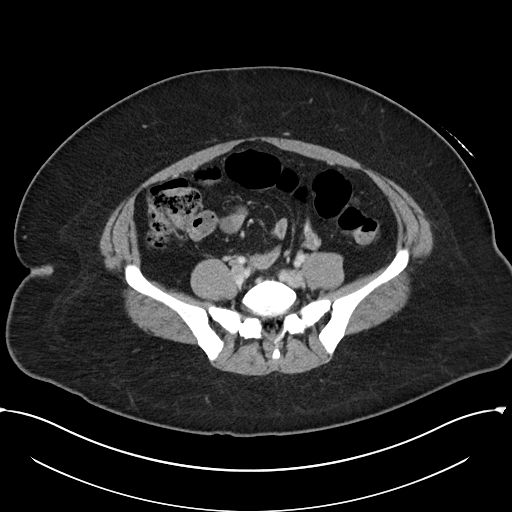
[im 41/94  soft-tissue]
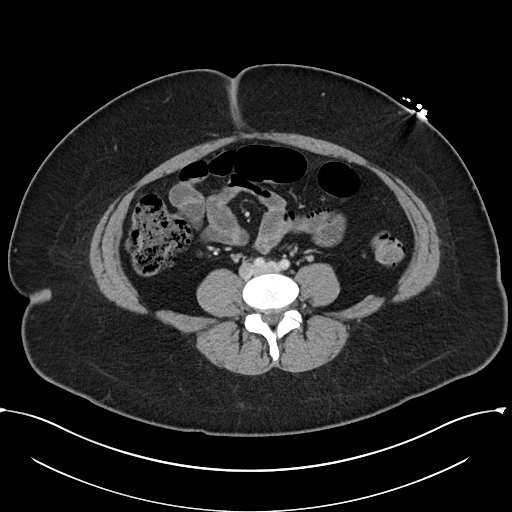
[im 47/94  soft-tissue]
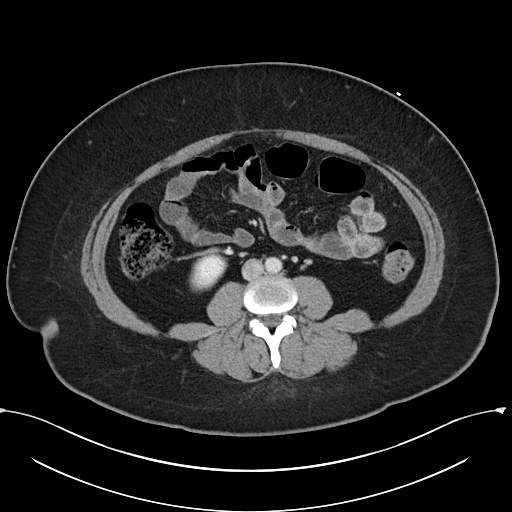
[im 53/94  soft-tissue]
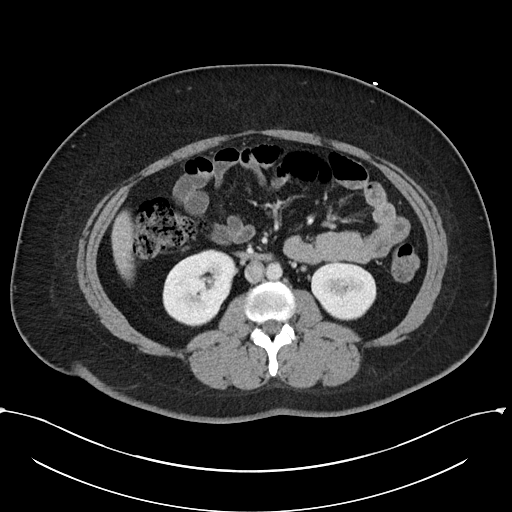
[im 59/94  soft-tissue]
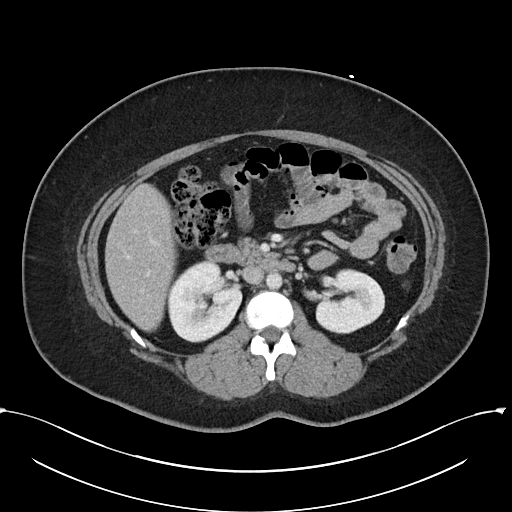
[im 59/94  bone]
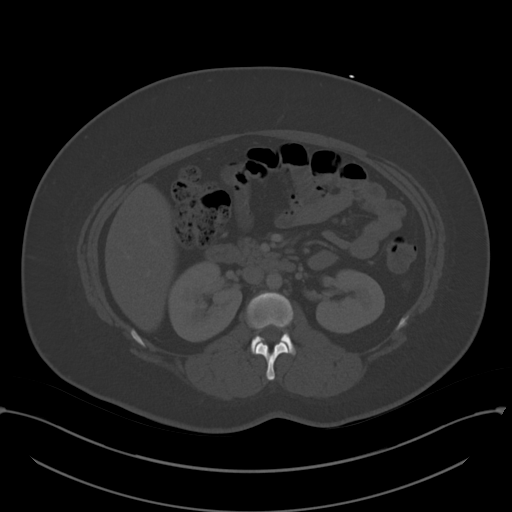
[im 64/94  soft-tissue]
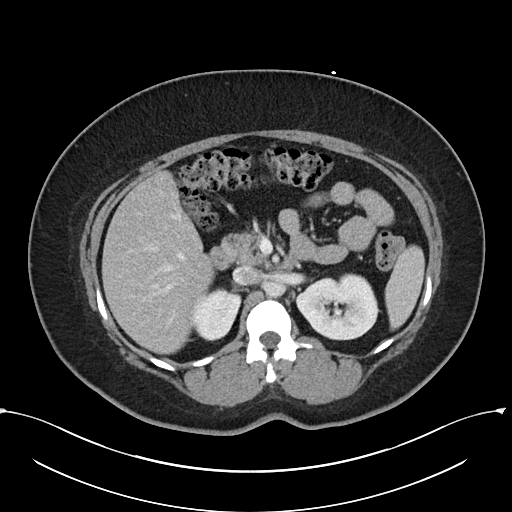
[im 76/94  soft-tissue]
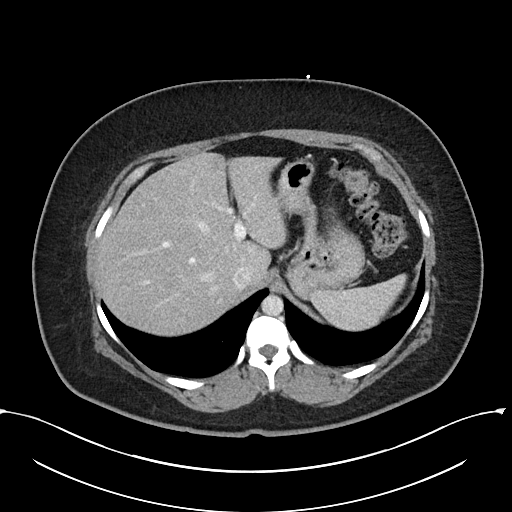
[im 82/94  soft-tissue]
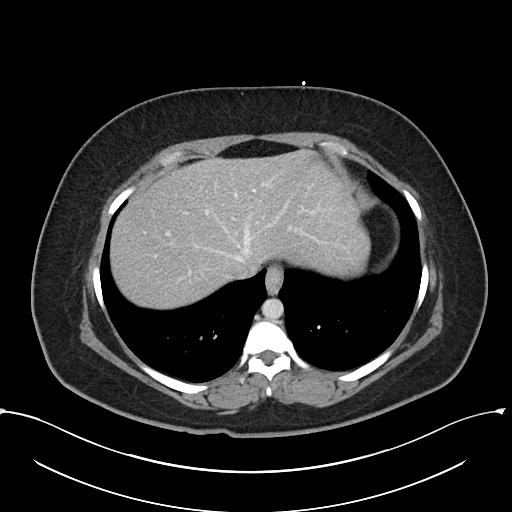
[im 88/94  soft-tissue]
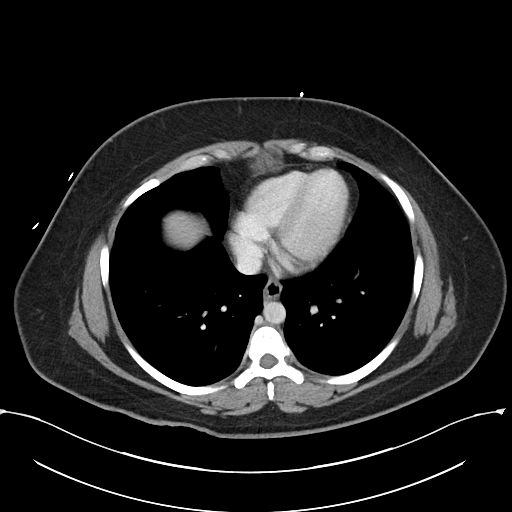

[Series 6: abdomen 3.0 mpr cor · coronal · 0.78mm/px · 3 of 86 slices shown]
[im 29/86  soft-tissue]
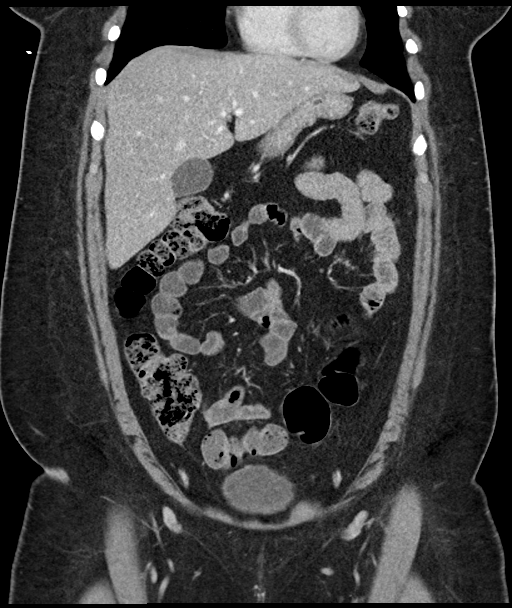
[im 38/86  soft-tissue]
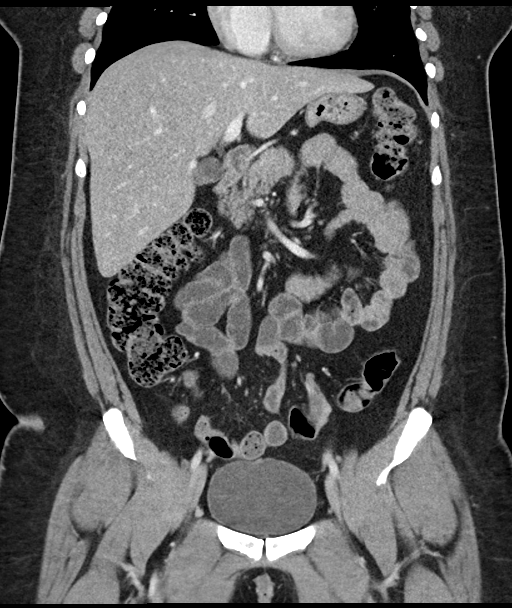
[im 48/86  soft-tissue]
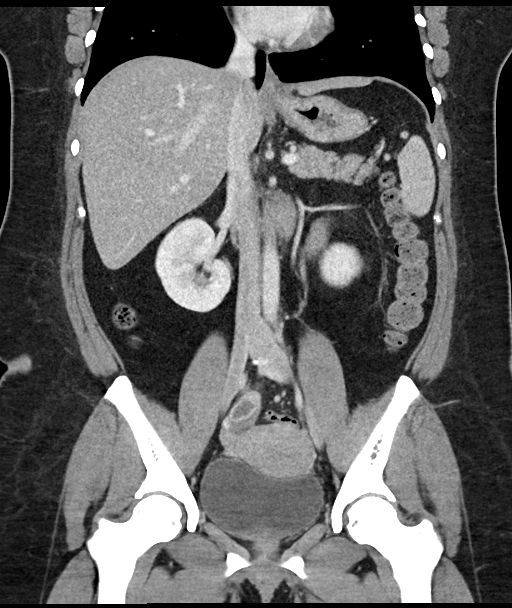

[16 of 46 positions shown; findings below may reference images not displayed]

FINDINGS: Lower chest: No acute abnormality.

Hepatobiliary: No focal liver abnormality is seen. No gallstones,
gallbladder wall thickening, or biliary dilatation.

Pancreas: Unremarkable. No pancreatic ductal dilatation or
surrounding inflammatory changes.

Spleen: Normal in size without focal abnormality.

Adrenals/Urinary Tract: Unremarkable adrenal glands. Kidneys enhance
symmetrically without focal lesion, stone, or hydronephrosis.
Ureters are nondilated. Urinary bladder appears unremarkable.

Stomach/Bowel: Stomach is unremarkable. There is fecalization of
small bowel content within the distal ileum. No dilated loops of
small bowel to suggest obstruction. Appendix is not definitively
visualized. No pericecal inflammatory changes are evident to suggest
appendicitis. Moderate volume of stool within the colon. No focal
bowel wall thickening or inflammatory changes.

Vascular/Lymphatic: No significant vascular findings are present. No
enlarged abdominal or pelvic lymph nodes.

Reproductive: Unremarkable appearance of the uterus. 1.9 cm probable
right ovarian corpus luteal cyst. Left ovary appears unremarkable.

Other: No free fluid. No abdominopelvic fluid collection. No
pneumoperitoneum. No abdominal wall hernia.

Musculoskeletal: No acute or significant osseous findings.
IMPRESSION: 1. No acute abdominopelvic findings.
2. Fecalization of small bowel content within the distal ileum
suggestive of slow transit. No evidence of bowel obstruction.
3. Moderate volume of stool within the colon.
4. Benign 1.9 cm probable right ovarian corpus luteal cyst. No
follow-up imaging recommended. Note: This recommendation does not
apply to premenarchal patients and to those with increased risk
(genetic, family history, elevated tumor markers or other high-risk
factors) of ovarian cancer. Reference: JACR [DATE]):[id number]
